# Patient Record
Sex: Female | Born: 1935
Health system: Southern US, Community
[De-identification: ages and names within clinical notes are randomized; demographics above are authoritative.]

## PROBLEM LIST (undated history)

## (undated) DIAGNOSIS — K5900 Constipation, unspecified: Secondary | ICD-10-CM

## (undated) DIAGNOSIS — I10 Essential (primary) hypertension: Secondary | ICD-10-CM

## (undated) DIAGNOSIS — E039 Hypothyroidism, unspecified: Secondary | ICD-10-CM

## (undated) DIAGNOSIS — R0602 Shortness of breath: Secondary | ICD-10-CM

## (undated) DIAGNOSIS — K219 Gastro-esophageal reflux disease without esophagitis: Secondary | ICD-10-CM

## (undated) DIAGNOSIS — I2510 Atherosclerotic heart disease of native coronary artery without angina pectoris: Secondary | ICD-10-CM

## (undated) DIAGNOSIS — J302 Other seasonal allergic rhinitis: Secondary | ICD-10-CM

## (undated) DIAGNOSIS — F32A Depression, unspecified: Secondary | ICD-10-CM

## (undated) DIAGNOSIS — F329 Major depressive disorder, single episode, unspecified: Secondary | ICD-10-CM

## (undated) DIAGNOSIS — F0390 Unspecified dementia without behavioral disturbance: Secondary | ICD-10-CM

## (undated) DIAGNOSIS — E785 Hyperlipidemia, unspecified: Secondary | ICD-10-CM

## (undated) DIAGNOSIS — M1990 Unspecified osteoarthritis, unspecified site: Secondary | ICD-10-CM

## (undated) HISTORY — PX: BACK SURGERY: SHX140

---

## 2004-02-20 ENCOUNTER — Ambulatory Visit: Payer: Self-pay | Admitting: Internal Medicine

## 2005-02-04 ENCOUNTER — Ambulatory Visit: Payer: Self-pay

## 2006-02-23 ENCOUNTER — Ambulatory Visit: Payer: Self-pay

## 2006-04-13 ENCOUNTER — Ambulatory Visit: Payer: Self-pay | Admitting: Gastroenterology

## 2007-03-31 ENCOUNTER — Ambulatory Visit: Payer: Self-pay | Admitting: Internal Medicine

## 2009-08-28 ENCOUNTER — Ambulatory Visit: Payer: Self-pay | Admitting: Internal Medicine

## 2009-10-17 ENCOUNTER — Ambulatory Visit: Payer: Self-pay | Admitting: Gastroenterology

## 2009-10-18 LAB — PATHOLOGY REPORT

## 2009-11-26 ENCOUNTER — Ambulatory Visit: Payer: Self-pay | Admitting: Gastroenterology

## 2010-10-21 ENCOUNTER — Ambulatory Visit: Payer: Self-pay | Admitting: Internal Medicine

## 2011-05-17 ENCOUNTER — Ambulatory Visit: Payer: Self-pay | Admitting: Internal Medicine

## 2011-06-08 ENCOUNTER — Ambulatory Visit: Payer: Self-pay | Admitting: Specialist

## 2011-11-30 ENCOUNTER — Ambulatory Visit: Payer: Self-pay | Admitting: Internal Medicine

## 2012-02-09 ENCOUNTER — Ambulatory Visit: Payer: Self-pay | Admitting: Neurology

## 2012-09-16 ENCOUNTER — Ambulatory Visit: Payer: Self-pay | Admitting: Internal Medicine

## 2013-01-06 ENCOUNTER — Ambulatory Visit: Payer: Self-pay | Admitting: Internal Medicine

## 2013-02-23 ENCOUNTER — Other Ambulatory Visit: Payer: Self-pay | Admitting: Neurological Surgery

## 2013-02-23 DIAGNOSIS — M47816 Spondylosis without myelopathy or radiculopathy, lumbar region: Secondary | ICD-10-CM

## 2013-03-02 ENCOUNTER — Ambulatory Visit
Admission: RE | Admit: 2013-03-02 | Discharge: 2013-03-02 | Disposition: A | Discharge status: Home / Self Care | Payer: Self-pay | Source: Ambulatory Visit | Attending: Neurological Surgery | Admitting: Neurological Surgery

## 2013-03-02 DIAGNOSIS — M47816 Spondylosis without myelopathy or radiculopathy, lumbar region: Secondary | ICD-10-CM

## 2013-03-02 MED ORDER — IOHEXOL 180 MG/ML  SOLN
18.0000 mL | Freq: Once | INTRAMUSCULAR | Status: AC | PRN
  Administered 2013-03-02: 18 mL via INTRATHECAL

## 2013-03-02 MED ORDER — DIAZEPAM 5 MG PO TABS: 5.0000 mg | ORAL_TABLET | Freq: Once | ORAL | Status: DC

## 2013-03-02 MED ORDER — ONDANSETRON HCL 4 MG/2ML IJ SOLN: 4.0000 mg | Freq: Four times a day (QID) | INTRAMUSCULAR | Status: DC | PRN

## 2013-03-02 NOTE — Progress Notes (Signed)
Patient states she has been off Citalopram for at least the past two days.  

## 2013-03-29 ENCOUNTER — Ambulatory Visit: Payer: Self-pay | Admitting: Physician Assistant

## 2013-04-24 ENCOUNTER — Ambulatory Visit: Payer: Self-pay | Admitting: Cardiology

## 2013-04-24 DIAGNOSIS — I2510 Atherosclerotic heart disease of native coronary artery without angina pectoris: Secondary | ICD-10-CM | POA: Insufficient documentation

## 2013-04-24 DIAGNOSIS — Z9861 Coronary angioplasty status: Secondary | ICD-10-CM

## 2013-04-24 HISTORY — PX: CARDIAC CATHETERIZATION: SHX172

## 2013-05-02 ENCOUNTER — Other Ambulatory Visit: Payer: Self-pay | Admitting: Neurological Surgery

## 2013-05-11 ENCOUNTER — Encounter (HOSPITAL_COMMUNITY): Payer: Self-pay | Admitting: Pharmacy Technician

## 2013-05-12 NOTE — Pre-Procedure Instructions (Signed)
Karla Alexander  05/12/2013   Your procedure is scheduled on:  Friday, May 26, 2013 at 11;01 AM  Report to [facility] (use Main Entrance "A'') at 9:00 AM.  Call this number if you have problems the morning of surgery: [phone]   Remember:   Do not eat food or drink liquids after midnight.   Take these medicines the morning of surgery with A SIP OF WATER: citalopram (CELEXA) 20 MG tablet, levothyroxine (SYNTHROID, LEVOTHROID), metoprolol succinate (TOPROL-XL) 25 MG 24 hr tablet, omeprazole (PRILOSEC) 20 MG capsule, fluticasone (FLONASE) 50 MCG/ACT nasal spray, Fluticasone-Salmeterol (ADVAIR DISKUS IN), if needed: pain medication ( Tylenol  or  Gabapentin  (Neurontin) or HYDROcodone-acetaminophen (NORCO/VICODIN), albuterol (PROVENTIL HFA;VENTOLIN HFA) 108 (90 BASE) MCG/ACT inhaler for wheezing or shortness of breath ( bring inhaler in with you on day of procedure) Stop taking Aspirin and herbal medications. Do not take any NSAIDs ie: Ibuprofen, Advil, Naproxen or any medication containing Aspirin (meloxicam (MOBIC) 15 MG tablet.  Stop 5 days prior to procedure.  Do not wear jewelry, make-up or nail polish.  Do not wear lotions, powders, or perfumes. You may wear deodorant.  Do not shave 48 hours prior to surgery.   Do not bring valuables to the hospital.  [facility] is not responsible for any belongings or valuables.               Contacts, dentures or bridgework may not be worn into surgery.  Leave suitcase in the car. After surgery it may be brought to your room.  For patients admitted to the hospital, discharge time is determined by your treatment team.               Patients discharged the day of surgery will not be allowed to drive home.  Name and phone number of your driver:   Special Instructions:  Special Instructions:Special Instructions: [facility] - Preparing for Surgery  Before surgery, you can play an important role.  Because skin is not sterile, your skin needs  to be as free of germs as possible.  You can reduce the number of germs on you skin by washing with CHG (chlorahexidine gluconate) soap before surgery.  CHG is an antiseptic cleaner which kills germs and bonds with the skin to continue killing germs even after washing.  Please DO NOT use if you have an allergy to CHG or antibacterial soaps.  If your skin becomes reddened/irritated stop using the CHG and inform your nurse when you arrive at Short Stay.  Do not shave (including legs and underarms) for at least 48 hours prior to the first CHG shower.  You may shave your face.  Please follow these instructions carefully:   1.  Shower with CHG Soap the night before surgery and the morning of Surgery.  2.  If you choose to wash your hair, wash your hair first as usual with your normal shampoo.  3.  After you shampoo, rinse your hair and body thoroughly to remove the Shampoo.  4.  Use CHG as you would any other liquid soap.  You can apply chg directly  to the skin and wash gently with scrungie or a clean washcloth.  5.  Apply the CHG Soap to your body ONLY FROM THE NECK DOWN.  Do not use on open wounds or open sores.  Avoid contact with your eyes, ears, mouth and genitals (private parts).  Wash genitals (private parts) with your normal soap.  6.  Wash thoroughly, paying  special attention to the area where your surgery will be performed.  7.  Thoroughly rinse your body with warm water from the neck down.  8.  DO NOT shower/wash with your normal soap after using and rinsing off the CHG Soap.  9.  Pat yourself dry with a clean towel.            10.  Wear clean pajamas.            11.  Place clean sheets on your bed the night of your first shower and do not sleep with pets.  Day of Surgery  Do not apply any lotions/deodorants the morning of surgery.  Please wear clean clothes to the hospital/surgery center.   Please read over the following fact sheets that you were given: Pain Booklet, Coughing and Deep  Breathing, Blood Transfusion Information, MRSA Information and Surgical Site Infection Prevention

## 2013-05-15 ENCOUNTER — Ambulatory Visit (HOSPITAL_COMMUNITY)
Admission: RE | Admit: 2013-05-15 | Discharge: 2013-05-15 | Disposition: A | Discharge status: Home / Self Care | Payer: Medicare HMO | Source: Ambulatory Visit | Attending: Neurological Surgery | Admitting: Neurological Surgery

## 2013-05-15 ENCOUNTER — Encounter (HOSPITAL_COMMUNITY): Payer: Self-pay

## 2013-05-15 ENCOUNTER — Encounter (HOSPITAL_COMMUNITY)
Admission: RE | Admit: 2013-05-15 | Discharge: 2013-05-15 | Disposition: A | Discharge status: Home / Self Care | Payer: Medicare HMO | Source: Ambulatory Visit | Attending: Neurological Surgery | Admitting: Neurological Surgery

## 2013-05-15 DIAGNOSIS — Z01818 Encounter for other preprocedural examination: Secondary | ICD-10-CM | POA: Insufficient documentation

## 2013-05-15 DIAGNOSIS — Z01810 Encounter for preprocedural cardiovascular examination: Secondary | ICD-10-CM | POA: Insufficient documentation

## 2013-05-15 DIAGNOSIS — Z01812 Encounter for preprocedural laboratory examination: Secondary | ICD-10-CM | POA: Insufficient documentation

## 2013-05-15 HISTORY — DX: Gastro-esophageal reflux disease without esophagitis: K21.9

## 2013-05-15 HISTORY — DX: Essential (primary) hypertension: I10

## 2013-05-15 HISTORY — DX: Major depressive disorder, single episode, unspecified: F32.9

## 2013-05-15 HISTORY — DX: Shortness of breath: R06.02

## 2013-05-15 HISTORY — DX: Depression, unspecified: F32.A

## 2013-05-15 HISTORY — DX: Unspecified osteoarthritis, unspecified site: M19.90

## 2013-05-15 HISTORY — DX: Hypothyroidism, unspecified: E03.9

## 2013-05-15 HISTORY — DX: Atherosclerotic heart disease of native coronary artery without angina pectoris: I25.10

## 2013-05-15 LAB — CBC WITH DIFFERENTIAL/PLATELET
BASOS ABS: 0 10*3/uL (ref 0.0–0.1)
BASOS PCT: 0 % (ref 0–1)
Eosinophils Absolute: 0.1 10*3/uL (ref 0.0–0.7)
Eosinophils Relative: 1 % (ref 0–5)
HCT: 40.3 % (ref 36.0–46.0)
Hemoglobin: 13.8 g/dL (ref 12.0–15.0)
Lymphocytes Relative: 32 % (ref 12–46)
Lymphs Abs: 2.6 10*3/uL (ref 0.7–4.0)
MCH: 30.4 pg (ref 26.0–34.0)
MCHC: 34.2 g/dL (ref 30.0–36.0)
MCV: 88.8 fL (ref 78.0–100.0)
Monocytes Absolute: 0.6 10*3/uL (ref 0.1–1.0)
Monocytes Relative: 7 % (ref 3–12)
NEUTROS PCT: 60 % (ref 43–77)
Neutro Abs: 4.9 10*3/uL (ref 1.7–7.7)
Platelets: 293 10*3/uL (ref 150–400)
RBC: 4.54 MIL/uL (ref 3.87–5.11)
RDW: 12.7 % (ref 11.5–15.5)
WBC: 8.2 10*3/uL (ref 4.0–10.5)

## 2013-05-15 LAB — BASIC METABOLIC PANEL
BUN: 13 mg/dL (ref 6–23)
CO2: 28 mEq/L (ref 19–32)
Calcium: 10 mg/dL (ref 8.4–10.5)
Chloride: 101 mEq/L (ref 96–112)
Creatinine, Ser: 1.02 mg/dL (ref 0.50–1.10)
GFR, EST AFRICAN AMERICAN: 59 mL/min — AB (ref >90)
GFR, EST NON AFRICAN AMERICAN: 51 mL/min — AB (ref >90)
Glucose, Bld: 103 mg/dL — ABNORMAL HIGH (ref 70–99)
POTASSIUM: 4 meq/L (ref 3.7–5.3)
Sodium: 141 mEq/L (ref 137–147)

## 2013-05-15 LAB — PROTIME-INR
INR: 0.93 (ref 0.00–1.49)
Prothrombin Time: 12.3 seconds (ref 11.6–15.2)

## 2013-05-15 LAB — SURGICAL PCR SCREEN
MRSA, PCR: NEGATIVE
STAPHYLOCOCCUS AUREUS: NEGATIVE

## 2013-05-15 LAB — ABO/RH: ABO/RH(D): O POS

## 2013-05-16 ENCOUNTER — Encounter (HOSPITAL_COMMUNITY): Payer: Self-pay

## 2013-05-16 LAB — TYPE AND SCREEN
ABO/RH(D): O POS
ANTIBODY SCREEN: NEGATIVE

## 2013-05-16 NOTE — Progress Notes (Signed)
Anesthesia Chart Review:  Patient is a 80 year old female scheduled for L4-5, L5-S1 MAS, PLIF on 05/26/13 by Dr. Ronnald Ramp.  History includes non-smoker, GERD, HTN, hypothyroidism, arthritis, depression, non-obstructive CAD 04/2013.  PCP is Dr. Doy Hutching.  Cardiologist is Dr. Isaias Cowman.   She had an abnormal stress test on 04/12/13 that showed apical ischemia, EF 68%.  She was subsequently referred for cardiac cath. Cardiac cath on 04/24/13 [facility]) showed: 20% mLAD, 20% OM1, 30% #1/60% #2 pRCA.  Medical therapy was recommended, and she was felt okay to proceed with neurosurgery.  EKG on 05/15/13 showed NSR, non-specific ST/T wave abnormality.  CXR on 05/15/13 showed: FINDINGS: Low lung volumes. Cardiac silhouette is enlarged. There is diffuse prominence of the interstitial markings. There is no evidence peribronchial cuffing nor effusions. No focal region consolidation or focal infiltrates. The osseous structures are unremarkable. IMPRESSION: Interstitial prominence likely representing underlying chronic bronchitic changes. No focal regions of consolidation or focal infiltrates. Unless clinically appropriate an infectious or inflammatory interstitial infiltrate is of much lower differential consideration.   Preoperative labs noted.    Anticipate that she can proceed as planned.  George Hugh [facility] Short Stay Center/Anesthesiology Phone [phone] 05/16/2013 2:43 PM

## 2013-05-19 ENCOUNTER — Other Ambulatory Visit: Payer: Self-pay | Admitting: Neurological Surgery

## 2013-05-30 MED ORDER — DEXAMETHASONE SODIUM PHOSPHATE 10 MG/ML IJ SOLN
10.0000 mg | INTRAMUSCULAR | Status: AC
  Administered 2013-05-31: 10 mg via INTRAVENOUS
  Filled 2013-05-30: qty 1

## 2013-05-30 MED ORDER — CEFAZOLIN SODIUM-DEXTROSE 2-3 GM-% IV SOLR
2.0000 g | INTRAVENOUS | Status: AC
  Administered 2013-05-31: 2 g via INTRAVENOUS
  Filled 2013-05-30: qty 50

## 2013-05-30 NOTE — Progress Notes (Signed)
I left a message for [place] regarding labs.  Since patient has bee rescheduled we will need new orders for labs or we will follow anesthesia orders.

## 2013-05-30 NOTE — Progress Notes (Signed)
I spoke with Ms Robello's neice who is her POA and she reported that Dr Ronnald Ramp' office told her to arrive at 10:00am.  I answered her questions regarding shampooing and showering and there were no further questions.

## 2013-05-31 ENCOUNTER — Inpatient Hospital Stay (HOSPITAL_COMMUNITY): Payer: Medicare HMO | Admitting: Anesthesiology

## 2013-05-31 ENCOUNTER — Encounter (HOSPITAL_COMMUNITY)
Admission: RE | Disposition: A | Discharge status: Skilled Nursing Facility | Payer: Medicare HMO | Source: Ambulatory Visit | Attending: Neurological Surgery

## 2013-05-31 ENCOUNTER — Inpatient Hospital Stay (HOSPITAL_COMMUNITY)
Admission: RE | Admit: 2013-05-31 | Discharge: 2013-06-06 | DRG: 460 | Disposition: A | Discharge status: Skilled Nursing Facility | Payer: Medicare HMO | Source: Ambulatory Visit | Attending: Neurological Surgery | Admitting: Neurological Surgery

## 2013-05-31 ENCOUNTER — Inpatient Hospital Stay (HOSPITAL_COMMUNITY): Payer: Medicare HMO

## 2013-05-31 ENCOUNTER — Encounter (HOSPITAL_COMMUNITY): Payer: Medicare HMO | Admitting: Vascular Surgery

## 2013-05-31 ENCOUNTER — Encounter (HOSPITAL_COMMUNITY): Payer: Self-pay | Admitting: *Deleted

## 2013-05-31 DIAGNOSIS — Z7982 Long term (current) use of aspirin: Secondary | ICD-10-CM

## 2013-05-31 DIAGNOSIS — Q762 Congenital spondylolisthesis: Principal | ICD-10-CM

## 2013-05-31 DIAGNOSIS — E669 Obesity, unspecified: Secondary | ICD-10-CM | POA: Diagnosis present

## 2013-05-31 DIAGNOSIS — I2510 Atherosclerotic heart disease of native coronary artery without angina pectoris: Secondary | ICD-10-CM | POA: Diagnosis present

## 2013-05-31 DIAGNOSIS — M129 Arthropathy, unspecified: Secondary | ICD-10-CM | POA: Diagnosis present

## 2013-05-31 DIAGNOSIS — Z981 Arthrodesis status: Secondary | ICD-10-CM

## 2013-05-31 DIAGNOSIS — K219 Gastro-esophageal reflux disease without esophagitis: Secondary | ICD-10-CM | POA: Diagnosis present

## 2013-05-31 DIAGNOSIS — E039 Hypothyroidism, unspecified: Secondary | ICD-10-CM | POA: Diagnosis present

## 2013-05-31 DIAGNOSIS — F3289 Other specified depressive episodes: Secondary | ICD-10-CM | POA: Diagnosis present

## 2013-05-31 DIAGNOSIS — I10 Essential (primary) hypertension: Secondary | ICD-10-CM | POA: Diagnosis present

## 2013-05-31 DIAGNOSIS — F329 Major depressive disorder, single episode, unspecified: Secondary | ICD-10-CM | POA: Diagnosis present

## 2013-05-31 DIAGNOSIS — N390 Urinary tract infection, site not specified: Secondary | ICD-10-CM | POA: Diagnosis not present

## 2013-05-31 DIAGNOSIS — M48061 Spinal stenosis, lumbar region without neurogenic claudication: Secondary | ICD-10-CM | POA: Diagnosis present

## 2013-05-31 DIAGNOSIS — Z79899 Other long term (current) drug therapy: Secondary | ICD-10-CM

## 2013-05-31 DIAGNOSIS — R339 Retention of urine, unspecified: Secondary | ICD-10-CM | POA: Diagnosis present

## 2013-05-31 HISTORY — PX: MAXIMUM ACCESS (MAS)POSTERIOR LUMBAR INTERBODY FUSION (PLIF) 2 LEVEL: SHX6369

## 2013-05-31 LAB — TYPE AND SCREEN
ABO/RH(D): O POS
Antibody Screen: NEGATIVE

## 2013-05-31 SURGERY — FOR MAXIMUM ACCESS (MAS) POSTERIOR LUMBAR INTERBODY FUSION (PLIF) 2 LEVEL
Anesthesia: General | Site: Back

## 2013-05-31 MED ORDER — PHENYLEPHRINE HCL 10 MG/ML IJ SOLN
INTRAMUSCULAR | Status: AC
  Filled 2013-05-31: qty 1

## 2013-05-31 MED ORDER — MENTHOL 3 MG MT LOZG: 1.0000 | LOZENGE | OROMUCOSAL | Status: DC | PRN

## 2013-05-31 MED ORDER — ONDANSETRON HCL 4 MG/2ML IJ SOLN: 4.0000 mg | INTRAMUSCULAR | Status: DC | PRN

## 2013-05-31 MED ORDER — LIDOCAINE HCL (CARDIAC) 20 MG/ML IV SOLN
INTRAVENOUS | Status: DC | PRN
  Administered 2013-05-31: 100 mg via INTRAVENOUS

## 2013-05-31 MED ORDER — HYDROMORPHONE HCL PF 1 MG/ML IJ SOLN
0.2500 mg | INTRAMUSCULAR | Status: DC | PRN
  Administered 2013-05-31: 0.5 mg via INTRAVENOUS

## 2013-05-31 MED ORDER — MORPHINE SULFATE 2 MG/ML IJ SOLN
1.0000 mg | INTRAMUSCULAR | Status: DC | PRN
  Administered 2013-05-31: 4 mg via INTRAVENOUS
  Administered 2013-06-01: 2 mg via INTRAVENOUS
  Filled 2013-05-31: qty 1
  Filled 2013-05-31: qty 2

## 2013-05-31 MED ORDER — THROMBIN 20000 UNITS EX SOLR
CUTANEOUS | Status: DC | PRN
  Administered 2013-05-31: 13:00:00 via TOPICAL

## 2013-05-31 MED ORDER — PHENOL 1.4 % MT LIQD: 1.0000 | OROMUCOSAL | Status: DC | PRN

## 2013-05-31 MED ORDER — SODIUM CHLORIDE 0.9 % IV SOLN: 250.0000 mL | INTRAVENOUS | Status: DC

## 2013-05-31 MED ORDER — FENTANYL CITRATE 0.05 MG/ML IJ SOLN
INTRAMUSCULAR | Status: AC
  Filled 2013-05-31: qty 5

## 2013-05-31 MED ORDER — HYDROMORPHONE HCL PF 1 MG/ML IJ SOLN
INTRAMUSCULAR | Status: AC
  Filled 2013-05-31: qty 1

## 2013-05-31 MED ORDER — ACETAMINOPHEN 650 MG RE SUPP: 650.0000 mg | RECTAL | Status: DC | PRN

## 2013-05-31 MED ORDER — 0.9 % SODIUM CHLORIDE (POUR BTL) OPTIME
TOPICAL | Status: DC | PRN
  Administered 2013-05-31: 1000 mL

## 2013-05-31 MED ORDER — EPHEDRINE SULFATE 50 MG/ML IJ SOLN
INTRAMUSCULAR | Status: DC | PRN
  Administered 2013-05-31: 10 mg via INTRAVENOUS
  Administered 2013-05-31: 15 mg via INTRAVENOUS
  Administered 2013-05-31 (×2): 10 mg via INTRAVENOUS

## 2013-05-31 MED ORDER — SUCCINYLCHOLINE CHLORIDE 20 MG/ML IJ SOLN
INTRAMUSCULAR | Status: DC | PRN
  Administered 2013-05-31: 100 mg via INTRAVENOUS

## 2013-05-31 MED ORDER — CELECOXIB 200 MG PO CAPS
200.0000 mg | ORAL_CAPSULE | Freq: Two times a day (BID) | ORAL | Status: AC
  Administered 2013-05-31 – 2013-06-05 (×10): 200 mg via ORAL
  Filled 2013-05-31 (×10): qty 1

## 2013-05-31 MED ORDER — LEVOTHYROXINE SODIUM 100 MCG PO TABS
100.0000 ug | ORAL_TABLET | Freq: Every day | ORAL | Status: DC
  Administered 2013-06-01 – 2013-06-06 (×6): 100 ug via ORAL
  Filled 2013-05-31 (×7): qty 1

## 2013-05-31 MED ORDER — PHENYLEPHRINE HCL 10 MG/ML IJ SOLN
INTRAMUSCULAR | Status: DC | PRN
  Administered 2013-05-31: 80 ug via INTRAVENOUS

## 2013-05-31 MED ORDER — GABAPENTIN 300 MG PO CAPS
300.0000 mg | ORAL_CAPSULE | Freq: Two times a day (BID) | ORAL | Status: DC | PRN
  Filled 2013-05-31: qty 1

## 2013-05-31 MED ORDER — ACETAMINOPHEN 10 MG/ML IV SOLN
1000.0000 mg | Freq: Once | INTRAVENOUS | Status: AC
  Administered 2013-05-31: 1000 mg via INTRAVENOUS

## 2013-05-31 MED ORDER — GLYCOPYRROLATE 0.2 MG/ML IJ SOLN
INTRAMUSCULAR | Status: DC | PRN
  Administered 2013-05-31: 0.2 mg via INTRAVENOUS

## 2013-05-31 MED ORDER — METHOCARBAMOL 500 MG PO TABS
500.0000 mg | ORAL_TABLET | Freq: Four times a day (QID) | ORAL | Status: DC | PRN
  Administered 2013-06-04 – 2013-06-05 (×3): 500 mg via ORAL
  Filled 2013-05-31 (×4): qty 1

## 2013-05-31 MED ORDER — ACETAMINOPHEN 10 MG/ML IV SOLN
INTRAVENOUS | Status: AC
  Filled 2013-05-31: qty 100

## 2013-05-31 MED ORDER — METHOCARBAMOL 100 MG/ML IJ SOLN
500.0000 mg | Freq: Four times a day (QID) | INTRAVENOUS | Status: DC | PRN
  Filled 2013-05-31: qty 5

## 2013-05-31 MED ORDER — LACTATED RINGERS IV SOLN
INTRAVENOUS | Status: DC
  Administered 2013-05-31: 11:00:00 via INTRAVENOUS

## 2013-05-31 MED ORDER — ALENDRONATE SODIUM 70 MG PO TABS: 70.0000 mg | ORAL_TABLET | ORAL | Status: DC

## 2013-05-31 MED ORDER — LACTATED RINGERS IV SOLN
INTRAVENOUS | Status: DC | PRN
  Administered 2013-05-31 (×2): via INTRAVENOUS

## 2013-05-31 MED ORDER — ALBUTEROL SULFATE (2.5 MG/3ML) 0.083% IN NEBU: 2.5000 mg | INHALATION_SOLUTION | Freq: Four times a day (QID) | RESPIRATORY_TRACT | Status: DC | PRN

## 2013-05-31 MED ORDER — BACITRACIN 50000 UNITS IM SOLR
INTRAMUSCULAR | Status: DC | PRN
  Administered 2013-05-31: 13:00:00

## 2013-05-31 MED ORDER — FLUTICASONE-SALMETEROL 100-50 MCG/DOSE IN AEPB
1.0000 | INHALATION_SPRAY | Freq: Two times a day (BID) | RESPIRATORY_TRACT | Status: DC
  Filled 2013-05-31: qty 14

## 2013-05-31 MED ORDER — ACETAMINOPHEN 325 MG PO TABS
650.0000 mg | ORAL_TABLET | ORAL | Status: DC | PRN
  Administered 2013-06-02: 650 mg via ORAL
  Filled 2013-05-31: qty 2

## 2013-05-31 MED ORDER — CLONAZEPAM 0.5 MG PO TABS
0.5000 mg | ORAL_TABLET | Freq: Two times a day (BID) | ORAL | Status: DC | PRN
  Administered 2013-06-05 – 2013-06-06 (×2): 0.5 mg via ORAL
  Filled 2013-05-31 (×2): qty 1

## 2013-05-31 MED ORDER — ONDANSETRON HCL 4 MG/2ML IJ SOLN: 4.0000 mg | Freq: Four times a day (QID) | INTRAMUSCULAR | Status: DC | PRN

## 2013-05-31 MED ORDER — METOPROLOL SUCCINATE ER 25 MG PO TB24
25.0000 mg | ORAL_TABLET | Freq: Every day | ORAL | Status: DC
  Administered 2013-06-02 – 2013-06-05 (×4): 25 mg via ORAL
  Filled 2013-05-31 (×6): qty 1

## 2013-05-31 MED ORDER — PANTOPRAZOLE SODIUM 40 MG IV SOLR
40.0000 mg | Freq: Every day | INTRAVENOUS | Status: DC
  Administered 2013-05-31: 40 mg via INTRAVENOUS
  Filled 2013-05-31 (×2): qty 40

## 2013-05-31 MED ORDER — FENTANYL CITRATE 0.05 MG/ML IJ SOLN
INTRAMUSCULAR | Status: DC | PRN
  Administered 2013-05-31: 150 ug via INTRAVENOUS
  Administered 2013-05-31: 50 ug via INTRAVENOUS

## 2013-05-31 MED ORDER — BUPIVACAINE HCL (PF) 0.25 % IJ SOLN
INTRAMUSCULAR | Status: DC | PRN
  Administered 2013-05-31: 6 mL

## 2013-05-31 MED ORDER — PROPOFOL 10 MG/ML IV BOLUS
INTRAVENOUS | Status: AC
  Filled 2013-05-31: qty 20

## 2013-05-31 MED ORDER — POTASSIUM CHLORIDE IN NACL 20-0.9 MEQ/L-% IV SOLN
INTRAVENOUS | Status: DC
  Administered 2013-05-31 – 2013-06-03 (×3): via INTRAVENOUS
  Filled 2013-05-31 (×12): qty 1000

## 2013-05-31 MED ORDER — THROMBIN 5000 UNITS EX SOLR
OROMUCOSAL | Status: DC | PRN
  Administered 2013-05-31: 13:00:00 via TOPICAL

## 2013-05-31 MED ORDER — MOMETASONE FURO-FORMOTEROL FUM 100-5 MCG/ACT IN AERO
2.0000 | INHALATION_SPRAY | Freq: Two times a day (BID) | RESPIRATORY_TRACT | Status: DC
  Administered 2013-06-01 – 2013-06-06 (×9): 2 via RESPIRATORY_TRACT
  Filled 2013-05-31: qty 8.8

## 2013-05-31 MED ORDER — CITALOPRAM HYDROBROMIDE 20 MG PO TABS
20.0000 mg | ORAL_TABLET | Freq: Every day | ORAL | Status: DC
  Administered 2013-05-31 – 2013-06-06 (×7): 20 mg via ORAL
  Filled 2013-05-31 (×7): qty 1

## 2013-05-31 MED ORDER — SODIUM CHLORIDE 0.9 % IJ SOLN
3.0000 mL | Freq: Two times a day (BID) | INTRAMUSCULAR | Status: DC
  Administered 2013-06-01 – 2013-06-05 (×4): 3 mL via INTRAVENOUS

## 2013-05-31 MED ORDER — SODIUM CHLORIDE 0.9 % IV SOLN
10.0000 mg | INTRAVENOUS | Status: DC | PRN
  Administered 2013-05-31: 10 ug/min via INTRAVENOUS

## 2013-05-31 MED ORDER — SODIUM CHLORIDE 0.9 % IJ SOLN: 3.0000 mL | INTRAMUSCULAR | Status: DC | PRN

## 2013-05-31 MED ORDER — CEFAZOLIN SODIUM 1-5 GM-% IV SOLN
1.0000 g | Freq: Three times a day (TID) | INTRAVENOUS | Status: AC
  Administered 2013-05-31 – 2013-06-01 (×2): 1 g via INTRAVENOUS
  Filled 2013-05-31 (×2): qty 50

## 2013-05-31 MED ORDER — PROPOFOL 10 MG/ML IV BOLUS
INTRAVENOUS | Status: DC | PRN
Start: 2013-05-31 — End: 2013-05-31
  Administered 2013-05-31: 125 mg via INTRAVENOUS
  Administered 2013-05-31: 25 mg via INTRAVENOUS

## 2013-05-31 MED ORDER — HYDROCODONE-ACETAMINOPHEN 5-325 MG PO TABS
1.0000 | ORAL_TABLET | Freq: Four times a day (QID) | ORAL | Status: DC | PRN
  Administered 2013-06-01: 1 via ORAL
  Administered 2013-06-02: 2 via ORAL
  Administered 2013-06-03 – 2013-06-05 (×7): 1 via ORAL
  Administered 2013-06-05 – 2013-06-06 (×3): 2 via ORAL
  Filled 2013-05-31: qty 2
  Filled 2013-05-31 (×2): qty 1
  Filled 2013-05-31 (×2): qty 2
  Filled 2013-05-31: qty 1
  Filled 2013-05-31: qty 2
  Filled 2013-05-31 (×5): qty 1
  Filled 2013-05-31: qty 2

## 2013-05-31 MED ORDER — SENNA 8.6 MG PO TABS
1.0000 | ORAL_TABLET | Freq: Two times a day (BID) | ORAL | Status: DC
  Administered 2013-05-31 – 2013-06-06 (×9): 8.6 mg via ORAL
  Filled 2013-05-31 (×12): qty 1

## 2013-05-31 MED ORDER — ALBUTEROL SULFATE HFA 108 (90 BASE) MCG/ACT IN AERS: 1.0000 | INHALATION_SPRAY | Freq: Four times a day (QID) | RESPIRATORY_TRACT | Status: DC | PRN

## 2013-05-31 MED ORDER — TRIAMTERENE-HCTZ 37.5-25 MG PO CAPS
1.0000 | ORAL_CAPSULE | Freq: Every day | ORAL | Status: DC
  Administered 2013-06-03 – 2013-06-05 (×3): 1 via ORAL
  Filled 2013-05-31 (×6): qty 1

## 2013-05-31 MED ORDER — ASPIRIN 325 MG PO TABS
325.0000 mg | ORAL_TABLET | Freq: Every day | ORAL | Status: DC
  Administered 2013-05-31 – 2013-06-06 (×7): 325 mg via ORAL
  Filled 2013-05-31 (×7): qty 1

## 2013-05-31 MED ORDER — OXYCODONE HCL 5 MG PO TABS: 5.0000 mg | ORAL_TABLET | Freq: Once | ORAL | Status: DC | PRN

## 2013-05-31 MED ORDER — FLUTICASONE PROPIONATE 50 MCG/ACT NA SUSP
1.0000 | Freq: Every day | NASAL | Status: DC
  Administered 2013-06-02 – 2013-06-06 (×4): 1 via NASAL
  Filled 2013-05-31: qty 16

## 2013-05-31 MED ORDER — ALBUMIN HUMAN 5 % IV SOLN
INTRAVENOUS | Status: DC | PRN
  Administered 2013-05-31: 14:00:00 via INTRAVENOUS

## 2013-05-31 MED ORDER — ARTIFICIAL TEARS OP OINT
TOPICAL_OINTMENT | OPHTHALMIC | Status: DC | PRN
  Administered 2013-05-31: 1 via OPHTHALMIC

## 2013-05-31 MED ORDER — OXYCODONE HCL 5 MG/5ML PO SOLN: 5.0000 mg | Freq: Once | ORAL | Status: DC | PRN

## 2013-05-31 SURGICAL SUPPLY — 69 items
BAG DECANTER FOR FLEXI CONT (MISCELLANEOUS) ×3 IMPLANT
BENZOIN TINCTURE PRP APPL 2/3 (GAUZE/BANDAGES/DRESSINGS) ×3 IMPLANT
BLADE SURG ROTATE 9660 (MISCELLANEOUS) IMPLANT
BONE MATRIX OSTEOCEL PRO MED (Bone Implant) ×6 IMPLANT
BUR MATCHSTICK NEURO 3.0 LAGG (BURR) ×3 IMPLANT
CAGE COROENT LG 10X9X23-12 (Cage) ×6 IMPLANT
CAGE PLIF 8X9X23-12 LUMBAR (Cage) ×6 IMPLANT
CANISTER SUCT 3000ML (MISCELLANEOUS) ×3 IMPLANT
CLIP NEUROVISION LG (CLIP) ×3 IMPLANT
CLOSURE WOUND 1/2 X4 (GAUZE/BANDAGES/DRESSINGS) ×2
CONT SPEC 4OZ CLIKSEAL STRL BL (MISCELLANEOUS) ×6 IMPLANT
COVER BACK TABLE 24X17X13 BIG (DRAPES) IMPLANT
COVER TABLE BACK 60X90 (DRAPES) ×3 IMPLANT
DRAPE C-ARM 42X72 X-RAY (DRAPES) ×3 IMPLANT
DRAPE C-ARMOR (DRAPES) ×3 IMPLANT
DRAPE LAPAROTOMY 100X72X124 (DRAPES) ×3 IMPLANT
DRAPE POUCH INSTRU U-SHP 10X18 (DRAPES) ×3 IMPLANT
DRAPE SURG 17X23 STRL (DRAPES) ×3 IMPLANT
DRESSING TELFA 8X3 (GAUZE/BANDAGES/DRESSINGS) ×3 IMPLANT
DRSG OPSITE 4X5.5 SM (GAUZE/BANDAGES/DRESSINGS) ×6 IMPLANT
DRSG OPSITE POSTOP 4X6 (GAUZE/BANDAGES/DRESSINGS) ×3 IMPLANT
DURAPREP 26ML APPLICATOR (WOUND CARE) ×3 IMPLANT
ELECT REM PT RETURN 9FT ADLT (ELECTROSURGICAL) ×3
ELECTRODE REM PT RTRN 9FT ADLT (ELECTROSURGICAL) ×1 IMPLANT
EVACUATOR 1/8 PVC DRAIN (DRAIN) ×3 IMPLANT
GAUZE SPONGE 4X4 16PLY XRAY LF (GAUZE/BANDAGES/DRESSINGS) IMPLANT
GLOVE BIO SURGEON STRL SZ8 (GLOVE) ×6 IMPLANT
GLOVE BIOGEL PI IND STRL 6.5 (GLOVE) ×1 IMPLANT
GLOVE BIOGEL PI IND STRL 8 (GLOVE) ×1 IMPLANT
GLOVE BIOGEL PI INDICATOR 6.5 (GLOVE) ×2
GLOVE BIOGEL PI INDICATOR 8 (GLOVE) ×2
GLOVE ECLIPSE 7.5 STRL STRAW (GLOVE) ×3 IMPLANT
GLOVE SS BIOGEL STRL SZ 6.5 (GLOVE) ×2 IMPLANT
GLOVE SUPERSENSE BIOGEL SZ 6.5 (GLOVE) ×4
GOWN BRE IMP SLV AUR LG STRL (GOWN DISPOSABLE) IMPLANT
GOWN BRE IMP SLV AUR XL STRL (GOWN DISPOSABLE) IMPLANT
GOWN STRL REIN 2XL LVL4 (GOWN DISPOSABLE) IMPLANT
GOWN STRL REUS W/ TWL LRG LVL3 (GOWN DISPOSABLE) ×2 IMPLANT
GOWN STRL REUS W/ TWL XL LVL3 (GOWN DISPOSABLE) ×3 IMPLANT
GOWN STRL REUS W/TWL LRG LVL3 (GOWN DISPOSABLE) ×4
GOWN STRL REUS W/TWL XL LVL3 (GOWN DISPOSABLE) ×6
HEMOSTAT POWDER KIT SURGIFOAM (HEMOSTASIS) ×3 IMPLANT
KIT BASIN OR (CUSTOM PROCEDURE TRAY) ×3 IMPLANT
KIT ROOM TURNOVER OR (KITS) ×3 IMPLANT
MILL MEDIUM DISP (BLADE) ×3 IMPLANT
NEEDLE HYPO 25X1 1.5 SAFETY (NEEDLE) ×3 IMPLANT
NS IRRIG 1000ML POUR BTL (IV SOLUTION) ×3 IMPLANT
PACK LAMINECTOMY NEURO (CUSTOM PROCEDURE TRAY) ×3 IMPLANT
PAD ARMBOARD 7.5X6 YLW CONV (MISCELLANEOUS) ×9 IMPLANT
ROD 60MM LUMBAR (Rod) ×6 IMPLANT
SCREW LOCK (Screw) ×12 IMPLANT
SCREW LOCK FXNS SPNE MAS PL (Screw) ×6 IMPLANT
SCREW SHANK 5.0X30MM (Screw) ×6 IMPLANT
SCREW SHANK 6.5X30 (Screw) ×3 IMPLANT
SCREW SHANK 6.5X65 (Screw) ×3 IMPLANT
SCREW TULIP 5.5 (Screw) ×18 IMPLANT
SPONGE LAP 4X18 X RAY DECT (DISPOSABLE) IMPLANT
SPONGE SURGIFOAM ABS GEL 100 (HEMOSTASIS) ×3 IMPLANT
STRIP CLOSURE SKIN 1/2X4 (GAUZE/BANDAGES/DRESSINGS) ×4 IMPLANT
SUT VIC AB 0 CT1 18XCR BRD8 (SUTURE) ×1 IMPLANT
SUT VIC AB 0 CT1 8-18 (SUTURE) ×2
SUT VIC AB 2-0 CP2 18 (SUTURE) ×3 IMPLANT
SUT VIC AB 3-0 SH 8-18 (SUTURE) ×6 IMPLANT
SYR 20ML ECCENTRIC (SYRINGE) ×3 IMPLANT
SYR 3ML LL SCALE MARK (SYRINGE) IMPLANT
TOWEL OR 17X24 6PK STRL BLUE (TOWEL DISPOSABLE) ×3 IMPLANT
TOWEL OR 17X26 10 PK STRL BLUE (TOWEL DISPOSABLE) ×3 IMPLANT
TRAY FOLEY CATH 14FRSI W/METER (CATHETERS) ×3 IMPLANT
WATER STERILE IRR 1000ML POUR (IV SOLUTION) ×3 IMPLANT

## 2013-05-31 NOTE — Op Note (Signed)
05/31/2013  4:20 PM  PATIENT:  Karla Alexander  80 y.o. female  PRE-OPERATIVE DIAGNOSIS:  Lytic spondylolisthesis L5-S1, spinal stenosis with spondylolisthesis L4-5, spinal stenosis L5-S1, back and leg pain  POST-OPERATIVE DIAGNOSIS:  Same  PROCEDURE:   1. Decompressive lumbar laminectomy L4-5 and L5-S1 requiring more work than would be required for a simple exposure of the disk for PLIF in order to adequately decompress the neural elements and address the spinal stenosis 2. Posterior lumbar interbody fusion L4-5 and L5-S1 using PEEK interbody cages packed with morcellized allograft and autograft 3. Posterior fixation L4-S1 inclusive using cortical pedicle screws.    SURGEON:  Sherley Bounds, MD  ASSISTANTS: Dr. Sherwood Gambler  ANESTHESIA:  General  EBL: 200 ml  Total I/O In: 4097 [I.V.:1400; IV Piggyback:250] Out: 500 [Urine:300; Blood:200]  BLOOD ADMINISTERED:none  DRAINS: Hemovac   INDICATION FOR PROCEDURE: This patient presented with a long history of back and leg pain. She had a CT scan with myelogram which showed a lytic pars defects at L5 with a grade 1 spondylolisthesis of local. She also had a spondylolisthesis that was degenerative in nature at L4-5. She had stenosis at both levels. She tried medical management without relief. I recommended a decompression and instrumented fusion. Patient understood the risks, benefits, and alternatives and potential outcomes and wished to proceed.  PROCEDURE DETAILS:  The patient was brought to the operating room. After induction of generalized endotracheal anesthesia the patient was rolled into the prone position on chest rolls and all pressure points were padded. The patient's lumbar region was cleaned and then prepped with DuraPrep and draped in the usual sterile fashion. Anesthesia was injected and then a dorsal midline incision was made and carried down to the lumbosacral fascia. The fascia was opened and the paraspinous musculature was taken  down in a subperiosteal fashion to expose L4-5 and L5-S1. A self-retaining retractor was placed. Intraoperative fluoroscopy confirmed my level, and I started with placement of the L4 cortical pedicle screws. The pedicle screw entry zones were identified utilizing surface landmarks and  AP and lateral fluoroscopy. I scored the cortex with the high-speed drill and then used the hand drill and EMG monitoring to drill an upward and outward direction into the pedicle. I then tapped line to line, and the tap was also monitored. I then placed a 5-0 x 30 mm cortical pedicle screw into the pedicles of L4 bilaterally. I then turned my attention to the decompression and the spinous process was removed and complete lumbar laminectomies, hemi- facetectomies, and foraminotomies were performed at L4-5 and L5-S1. The patient had significant spinal stenosis and this required more work than would be required for a simple exposure of the disc for posterior lumbar interbody fusion. Much more generous decompression was undertaken in order to adequately decompress the neural elements and address the patient's leg pain. The yellow ligament was removed to expose the underlying dura and nerve roots, and generous foraminotomies were performed to adequately decompress the neural elements. There was severe stenosis around the right L5 nerve root and this was decompressed distally into the foramen. Both the exiting and traversing nerve roots were decompressed on both sides until a coronary dilator passed easily along the nerve roots. Once the decompression was complete, I turned my attention to the posterior lower lumbar interbody fusion. The epidural venous vasculature was coagulated and cut sharply. Disc space was incised at both levels and the initial discectomy was performed with pituitary rongeurs. The disc space was distracted with sequential distractors to  a height of 8 mm L5-S1 and 10 mm at L4-5. We then used a series of scrapers and  shavers to prepare the endplates for fusion at both levels. The midline was prepared with Epstein curettes. Once the complete discectomy was finished, we packed an appropriate sized peek interbody cage with local autograft and morcellized allograft, gently retracted the nerve root, and tapped the cage into position at L4-5 and L5-S1.  The midline between the cages was packed with morselized autograft and allograft. We then turned our attention to the placement of the lower pedicle screws. The pedicle screw entry zones were identified utilizing surface landmarks and fluoroscopy. I drilled into each pedicle utilizing the hand drill and EMG monitoring, and tapped each pedicle with the appropriate tap. We palpated with a ball probe to assure no break in the cortex. We then placed 5-0 by a 30 mm cortical pedicle screws into the pedicles bilaterally at L5, and 6 5 x 30 mm pedicle screws at S1.. We then placed lordotic rods into the multiaxial screw heads of the pedicle screws and locked these in position with the locking caps and anti-torque device. We then checked our construct with AP and lateral fluoroscopy. Irrigated with copious amounts of bacitracin-containing saline solution. Placed a medium Hemovac drain through separate stab incision. Inspected the nerve roots once again to assure adequate decompression, lined to the dura with Gelfoam, and closed the muscle and the fascia with 0 Vicryl. Closed the subcutaneous tissues with 2-0 Vicryl and subcuticular tissues with 3-0 Vicryl. The skin was closed with benzoin and Steri-Strips. Dressing was then applied, the patient was awakened from general anesthesia and transported to the recovery room in stable condition. At the end of the procedure all sponge, needle and instrument counts were correct.   PLAN OF CARE: Admit to inpatient   PATIENT DISPOSITION:  PACU - hemodynamically stable.   Delay start of Pharmacological VTE agent (>24hrs) due to surgical blood loss or  risk of bleeding:  yes

## 2013-05-31 NOTE — Preoperative (Signed)
Beta Blockers   Reason not to administer Beta Blockers:Not Applicable 

## 2013-05-31 NOTE — Anesthesia Procedure Notes (Signed)
Procedure Name: Intubation Date/Time: 05/31/2013 12:25 PM Performed by: Neldon Newport Pre-anesthesia Checklist: Patient identified, Timeout performed, Emergency Drugs available, Suction available and Patient being monitored Patient Re-evaluated:Patient Re-evaluated prior to inductionOxygen Delivery Method: Circle system utilized Preoxygenation: Pre-oxygenation with 100% oxygen Intubation Type: IV induction and Rapid sequence Ventilation: Mask ventilation without difficulty Laryngoscope Size: Mac and 3 Grade View: Grade I Tube type: Oral Tube size: 7.0 mm Number of attempts: 1 (Rapid desaturation on induction.) Placement Confirmation: positive ETCO2,  ETT inserted through vocal cords under direct vision and breath sounds checked- equal and bilateral Secured at: 22 cm Tube secured with: Tape Dental Injury: Teeth and Oropharynx as per pre-operative assessment

## 2013-05-31 NOTE — Anesthesia Preprocedure Evaluation (Addendum)
Anesthesia Evaluation  Patient identified by MRN, date of birth, ID band Patient awake    Reviewed: Allergy & Precautions, H&P , NPO status , Patient's Chart, lab work & pertinent test results  Airway Mallampati: II  Neck ROM: full    Dental  (+) Upper Dentures, Edentulous Upper, Edentulous Lower, Lower Dentures   Pulmonary shortness of breath,          Cardiovascular hypertension, + CAD     Neuro/Psych PSYCHIATRIC DISORDERS Depression    GI/Hepatic GERD-  ,  Endo/Other  Hypothyroidism obese  Renal/GU      Musculoskeletal  (+) Arthritis -,   Abdominal   Peds  Hematology   Anesthesia Other Findings   Reproductive/Obstetrics                          Anesthesia Physical Anesthesia Plan  ASA: III  Anesthesia Plan: General   Post-op Pain Management:    Induction: Intravenous  Airway Management Planned: Oral ETT  Additional Equipment:   Intra-op Plan:   Post-operative Plan: Extubation in OR  Informed Consent: I have reviewed the patients History and Physical, chart, labs and discussed the procedure including the risks, benefits and alternatives for the proposed anesthesia with the patient or authorized representative who has indicated his/her understanding and acceptance.   Dental Advisory Given  Plan Discussed with: CRNA, Anesthesiologist and Surgeon  Anesthesia Plan Comments:        Anesthesia Quick Evaluation

## 2013-05-31 NOTE — Transfer of Care (Signed)
Immediate Anesthesia Transfer of Care Note  Patient: Karla Alexander  Procedure(s) Performed: Procedure(s) with comments: FOR MAXIMUM ACCESS (MAS) POSTERIOR LUMBAR INTERBODY FUSION (PLIF) Lumbar Four-Five, Lumbar Five-Sacral One (N/A) - FOR MAXIMUM ACCESS (MAS) POSTERIOR LUMBAR INTERBODY FUSION (PLIF) Lumbar Four-Five, Lumbar Five-Sacral One  Patient Location: PACU  Anesthesia Type:General  Level of Consciousness: awake, alert  and oriented  Airway & Oxygen Therapy: Patient Spontanous Breathing and Patient connected to nasal cannula oxygen  Post-op Assessment: Report given to PACU RN, Post -op Vital signs reviewed and stable and Patient moving all extremities  Post vital signs: Reviewed and stable  Complications: No apparent anesthesia complications

## 2013-05-31 NOTE — H&P (Signed)
Subjective: Patient is a 80 y.o. female admitted for PLIF L4-5, L5-S1. Onset of symptoms was several months ago, gradually worsening since that time.  The pain is rated severe, and is located at the across the lower back and radiates to legs. The pain is described as aching and occurs all day. The symptoms have been progressive. Symptoms are exacerbated by exercise. MRI or CT showed spondylolisthesis with stenosis L4-5 L5-S1   Past Medical History  Diagnosis Date  . Depression   . GERD (gastroesophageal reflux disease)   . Hypothyroidism   . Shortness of breath   . Arthritis   . Hypertension     dr Delsa Sale   [place]  . Coronary artery disease     non-obstructive CAD 04/2013    Past Surgical History  Procedure Laterality Date  . No past surgeries    . Cardiac catheterization  04/24/2013    20% mLAD, 20% OM1, 30% #1/60% #2 pRCA [facility]).  EF 68% by stress test 04/12/13.     Prior to Admission medications   Medication Sig Start Date End Date Taking? Authorizing Provider  acetaminophen (TYLENOL) 325 MG tablet Take 650 mg by mouth every 6 (six) hours as needed for mild pain or fever.   Yes Historical Provider, MD  albuterol (PROVENTIL HFA;VENTOLIN HFA) 108 (90 BASE) MCG/ACT inhaler Inhale 1 puff into the lungs every 6 (six) hours as needed for wheezing or shortness of breath.   Yes Historical Provider, MD  alendronate (FOSAMAX) 70 MG tablet Take 70 mg by mouth once a week. Take with a full glass of water on an empty stomach.   Yes Historical Provider, MD  aspirin 325 MG tablet Take 325 mg by mouth daily.   Yes Historical Provider, MD  aspirin-sod bicarb-citric acid (ALKA-SELTZER) 325 MG TBEF tablet Take 325 mg by mouth every 6 (six) hours as needed.   Yes Historical Provider, MD  calcium carbonate (OS-CAL) 600 MG TABS tablet Take 600 mg by mouth 2 (two) times daily with a meal.   Yes Historical Provider, MD  Cholecalciferol (VITAMIN D3) 5000 UNITS CAPS Take 1 capsule by mouth daily.   Yes  Historical Provider, MD  citalopram (CELEXA) 20 MG tablet Take 20 mg by mouth daily.   Yes Historical Provider, MD  clonazePAM (KLONOPIN) 0.5 MG tablet Take 0.5 mg by mouth 2 (two) times daily as needed (for restless legs).   Yes Historical Provider, MD  docusate sodium (COLACE) 100 MG capsule Take 100 mg by mouth daily as needed for mild constipation.   Yes Historical Provider, MD  fluticasone (FLONASE) 50 MCG/ACT nasal spray Place 1 spray into both nostrils daily.   Yes Historical Provider, MD  Fluticasone-Salmeterol (ADVAIR DISKUS IN) Inhale 1 puff into the lungs 2 (two) times daily.   Yes Historical Provider, MD  gabapentin (NEURONTIN) 300 MG capsule Take 300 mg by mouth 2 (two) times daily as needed (for pain).   Yes Historical Provider, MD  HYDROcodone-acetaminophen (NORCO/VICODIN) 5-325 MG per tablet Take 1-2 tablets by mouth every 6 (six) hours as needed for moderate pain.   Yes Historical Provider, MD  levothyroxine (SYNTHROID, LEVOTHROID) 100 MCG tablet Take 100 mcg by mouth daily before breakfast.   Yes Historical Provider, MD  lovastatin (MEVACOR) 20 MG tablet Take 20 mg by mouth.   Yes Historical Provider, MD  meloxicam (MOBIC) 15 MG tablet Take 15 mg by mouth daily as needed for pain.   Yes Historical Provider, MD  metoprolol succinate (TOPROL-XL) 25 MG 24  hr tablet Take 25 mg by mouth daily.   Yes Historical Provider, MD  omeprazole (PRILOSEC) 20 MG capsule Take 20 mg by mouth 2 (two) times daily before a meal.   Yes Historical Provider, MD  triamterene-hydrochlorothiazide (DYAZIDE) 37.5-25 MG per capsule Take 1 capsule by mouth daily.   Yes Historical Provider, MD  vitamin B-12 (CYANOCOBALAMIN) 1000 MCG tablet Take 1,000 mcg by mouth daily.   Yes Historical Provider, MD   No Known Allergies  History  Substance Use Topics  . Smoking status: Never Smoker   . Smokeless tobacco: Not on file  . Alcohol Use: No    History reviewed. No pertinent family history.   Review of  Systems  Positive ROS: neg  All other systems have been reviewed and were otherwise negative with the exception of those mentioned in the HPI and as above.  Objective: Vital signs in last 24 hours: Temp:  [98.1 F (36.7 C)] 98.1 F (36.7 C) (03/11 1013) Pulse Rate:  [69] 69 (03/11 1013) Resp:  [18] 18 (03/11 1013) BP: (127)/(78) 127/78 mmHg (03/11 1013) SpO2:  [96 %] 96 % (03/11 1013)  General Appearance: Alert, cooperative, no distress, appears stated age Head: Normocephalic, without obvious abnormality, atraumatic Eyes: PERRL, conjunctiva/corneas clear, EOM's intact    Neck: Supple, symmetrical, trachea midline Back: Symmetric, no curvature, ROM normal, no CVA tenderness Lungs:  respirations unlabored Heart: Regular rate and rhythm Abdomen: Soft, non-tender Extremities: Extremities normal, atraumatic, no cyanosis or edema Pulses: 2+ and symmetric all extremities Skin: Skin color, texture, turgor normal, no rashes or lesions  NEUROLOGIC:   Mental status: Alert and oriented x4,  no aphasia, good attention span, fund of knowledge, and memory Motor Exam - grossly normal Sensory Exam - grossly normal Reflexes: 1+ Coordination - grossly normal Gait - grossly normal Balance - grossly normal Cranial Nerves: I: smell Not tested  II: visual acuity  OS: nl    OD: nl  II: visual fields Full to confrontation  II: pupils Equal, round, reactive to light  III,VII: ptosis None  III,IV,VI: extraocular muscles  Full ROM  V: mastication Normal  V: facial light touch sensation  Normal  V,VII: corneal reflex  Present  VII: facial muscle function - upper  Normal  VII: facial muscle function - lower Normal  VIII: hearing Not tested  IX: soft palate elevation  Normal  IX,X: gag reflex Present  XI: trapezius strength  5/5  XI: sternocleidomastoid strength 5/5  XI: neck flexion strength  5/5  XII: tongue strength  Normal    Data Review Lab Results  Component Value Date   WBC 8.2  05/15/2013   HGB 13.8 05/15/2013   HCT 40.3 05/15/2013   MCV 88.8 05/15/2013   PLT 293 05/15/2013   Lab Results  Component Value Date   NA 141 05/15/2013   K 4.0 05/15/2013   CL 101 05/15/2013   CO2 28 05/15/2013   BUN 13 05/15/2013   CREATININE 1.02 05/15/2013   GLUCOSE 103* 05/15/2013   Lab Results  Component Value Date   INR 0.93 05/15/2013    Assessment/Plan: Patient admitted for PLIF L4-5 L5-S1. Patient has failed a reasonable attempt at conservative therapy.  I explained the condition and procedure to the patient and answered any questions.  Patient wishes to proceed with procedure as planned. Understands risks/ benefits and typical outcomes of procedure.   Ermine Spofford S 05/31/2013 11:35 AM

## 2013-05-31 NOTE — Anesthesia Postprocedure Evaluation (Signed)
Anesthesia Post Note  Patient: Karla Alexander  Procedure(s) Performed: Procedure(s) (LRB): FOR MAXIMUM ACCESS (MAS) POSTERIOR LUMBAR INTERBODY FUSION (PLIF) Lumbar Four-Five, Lumbar Five-Sacral One (N/A)  Anesthesia type: General  Patient location: PACU  Post pain: Pain level controlled and Adequate analgesia  Post assessment: Post-op Vital signs reviewed, Patient's Cardiovascular Status Stable, Respiratory Function Stable, Patent Airway and Pain level controlled  Last Vitals:  Filed Vitals:   05/31/13 1645  BP:   Pulse: 93  Temp:   Resp: 15    Post vital signs: Reviewed and stable  Level of consciousness: awake, alert  and oriented  Complications: No apparent anesthesia complications

## 2013-06-01 ENCOUNTER — Encounter (HOSPITAL_COMMUNITY): Payer: Self-pay | Admitting: Neurological Surgery

## 2013-06-01 LAB — GLUCOSE, CAPILLARY: Glucose-Capillary: 131 mg/dL — ABNORMAL HIGH (ref 70–99)

## 2013-06-01 MED ORDER — PANTOPRAZOLE SODIUM 40 MG PO TBEC
40.0000 mg | DELAYED_RELEASE_TABLET | Freq: Every day | ORAL | Status: DC
  Administered 2013-06-01 – 2013-06-06 (×6): 40 mg via ORAL
  Filled 2013-06-01 (×2): qty 1

## 2013-06-01 NOTE — Progress Notes (Signed)
Utilization review completed.  

## 2013-06-01 NOTE — Progress Notes (Signed)
Filed Vitals:   05/31/13 2127 06/01/13 0111 06/01/13 0206 06/01/13 0522  BP: 120/65  112/67 121/59  Pulse: 71  85 77  Temp: 97.6 F (36.4 C)  97.6 F (36.4 C) 98 F (36.7 C)  TempSrc: Oral  Oral Oral  Resp: 16  16 16   Height:  5\' 1"  (1.549 m)    Weight:  76.4 kg (168 lb 6.9 oz)    SpO2: 96%  96% 96%    Patient sitting in bed eating breakfast.  Has been up to the chair, and ambulated in a limited fashion. Foley removed by staff. Staff reports dressing is clean and dry.  Plan: Have encouraged patient and staff to progress to gradually increasing distances of ambulation in the halls today.  Hosie Spangle, MD 06/01/2013, 8:23 AM

## 2013-06-01 NOTE — Evaluation (Signed)
Physical Therapy Evaluation Patient Details Name: Karla Alexander MRN: [id number] DOB: 1933-03-01 Today's Date: 06/01/2013 Time: 7616-0737 PT Time Calculation (min): 48 min  PT Assessment / Plan / Recommendation History of Present Illness  pt presents with L4-S1 PLIF.    Clinical Impression  Pt generally unsteady and with cognitive deficits, that family states are baseline for pt.  At this time family expresses that they are not comfortable taking pt home at this time.  Feel SNF is safest D/C plan for pt as she will need 24hr care.  Will continue to follow.      PT Assessment  Patient needs continued PT services    Follow Up Recommendations  SNF    Does the patient have the potential to tolerate intense rehabilitation      Barriers to Discharge        Equipment Recommendations  Rolling walker with 5" wheels;3in1 (PT)    Recommendations for Other Services     Frequency Min 5X/week    Precautions / Restrictions Precautions Precautions: Fall Required Braces or Orthoses: Spinal Brace Spinal Brace: Lumbar corset;Applied in sitting position Restrictions Weight Bearing Restrictions: No   Pertinent Vitals/Pain Indicates "Not bad!"        Mobility  Bed Mobility Overal bed mobility: Needs Assistance Bed Mobility: Rolling;Sidelying to Sit Rolling: Min guard Sidelying to sit: Min guard General bed mobility comments: cues for safety and log roll technique.   Transfers Overall transfer level: Needs assistance Equipment used: Rolling walker (2 wheeled) Transfers: Sit to/from Stand Sit to Stand: Min assist General transfer comment: cues for UE use and back precautions.   Ambulation/Gait Ambulation/Gait assistance: Min assist Ambulation Distance (Feet): 100 Feet Assistive device: Rolling walker (2 wheeled) Gait Pattern/deviations: Step-through pattern;Decreased stride length Gait velocity interpretation: Below normal speed for age/gender General Gait Details: pt with almost  festinating type gait with narrow BOS and keeps RW far out in front of her.  pt needs lots of cueing and A to stay closer to RW and for safety.  Cues for back precautions.      Exercises     PT Diagnosis: Difficulty walking;Generalized weakness  PT Problem List: Decreased strength;Decreased activity tolerance;Decreased balance;Decreased mobility;Decreased cognition;Decreased knowledge of use of DME;Decreased safety awareness;Decreased knowledge of precautions PT Treatment Interventions: DME instruction;Gait training;Functional mobility training;Therapeutic activities;Therapeutic exercise;Balance training;Neuromuscular re-education;Patient/family education;Cognitive remediation     PT Goals(Current goals can be found in the care plan section) Acute Rehab PT Goals Patient Stated Goal: Get better PT Goal Formulation: With patient Time For Goal Achievement: 06/15/13 Potential to Achieve Goals: Good  Visit Information  Last PT Received On: 06/01/13 Assistance Needed: +1 History of Present Illness: pt presents with L4-S1 PLIF.         Prior [place] expects to be discharged to:: Skilled nursing facility Prior Function Level of Independence: Needs assistance ADL's / Homemaking Assistance Needed: pt needs A for Homemaking and driving.   Communication Communication: No difficulties    Cognition  Cognition Arousal/Alertness: Awake/alert Behavior During Therapy: WFL for tasks assessed/performed Overall Cognitive Status: History of cognitive impairments - at baseline General Comments: pt does not have a hx of Dementia, but family/CGs not that pt has memory difecitis and that she is currently at her baseline cognition.      Extremity/Trunk Assessment Upper Extremity Assessment Upper Extremity Assessment: Defer to OT evaluation Lower Extremity Assessment Lower Extremity Assessment: Generalized weakness Cervical / Trunk Assessment Cervical / Trunk  Assessment: Kyphotic   Balance Balance Overall  balance assessment: Needs assistance Standing balance support: Bilateral upper extremity supported Standing balance-Leahy Scale: Poor  End of Session PT - End of Session Equipment Utilized During Treatment: Gait belt;Back brace Activity Tolerance: Patient tolerated treatment well Patient left: in chair;with call bell/phone within reach;with chair alarm set;with family/visitor present Nurse Communication: Mobility status  GP     Karla Alexander, [place] 06/01/2013, 1:14 PM

## 2013-06-01 NOTE — Progress Notes (Signed)
Pt has walked with therapy and with RN 2-3x today. Pt used walker and brace and ambulated well. Pt has voided 3x today. Two incontinent episodes and once she was able to make it to the commode. Pt has been confused at times and reoriented. RN will continue to monitor.

## 2013-06-02 MED ORDER — BISACODYL 10 MG RE SUPP
10.0000 mg | Freq: Every day | RECTAL | Status: DC | PRN
  Administered 2013-06-02: 10 mg via RECTAL
  Filled 2013-06-02: qty 1

## 2013-06-02 MED FILL — Sodium Chloride IV Soln 0.9%: INTRAVENOUS | Qty: 1000 | Status: AC

## 2013-06-02 MED FILL — Heparin Sodium (Porcine) Inj 1000 Unit/ML: INTRAMUSCULAR | Qty: 30 | Status: AC

## 2013-06-02 NOTE — Progress Notes (Signed)
Filed Vitals:   06/01/13 2052 06/01/13 2144 06/02/13 0250 06/02/13 0509  BP:  131/74 124/71 144/80  Pulse:  87 85 85  Temp:  97.7 F (36.5 C) 98.5 F (36.9 C) 97.4 F (36.3 C)  TempSrc:  Oral Oral Oral  Resp:  18 18 18   Height:      Weight:      SpO2: 94% 96% 98% 96%    Patient sitting up, having breakfast. Nursing staff reports dressing is clean and dry. Patient reports that she ambulating once in the hallways yesterday.  Plan: Encouraged patient to increase activity and ambulation, and recommended ambulation at least 3-4 times in the hallways with a staff and/or family.  Spoke with patient is 2 nieces who along with the patient would like to undergo rehabilitation at a skilled nursing facility. The patient's husband is at [facility] in [place], and they would like her post discharge rehabilitation to be at that facility. I've discussed this with the nursing staff and nursing administration, and they will consults social work to make the necessary arrangements. In the meantime we will continue physical therapy here at the hospital.  I've asked the nursing staff to remove the patient's dressing and Hemovac drain, and apply a small dressing to the drain exit site.  Hosie Spangle, MD 06/02/2013, 8:35 AM

## 2013-06-02 NOTE — Clinical Social Work Placement (Signed)
Clinical Social Work Department CLINICAL SOCIAL WORK PLACEMENT NOTE 06/02/2013  Patient:  Karla Alexander, Karla Alexander  Account Number:  [id number] [place] date:  05/31/2013  Clinical Social Worker:  Wylene Men  Date/time:  06/02/2013 12:29 PM  Clinical Social Work is seeking post-discharge placement for this patient at the following level of care:   [place]   (*CSW will update this form in Epic as items are completed)   06/02/2013  Patient/family provided with [place] Department of Clinical Social Work's list of facilities offering this level of care within the geographic area requested by the patient (or if unable, by the patient's family).  06/02/2013  Patient/family informed of their freedom to choose among providers that offer the needed level of care, that participate in Medicare, Medicaid or managed care program needed by the patient, have an available bed and are willing to accept the patient.  06/02/2013  Patient/family informed of [facility]' ownership interest in [facility], as well as of the fact that they are under no obligation to receive care at this facility.  PASARR submitted to EDS on 06/02/2013 PASARR number received from EDS on 06/02/2013  FL2 transmitted to all facilities in geographic area requested by pt/family on  06/02/2013 FL2 transmitted to all facilities within larger geographic area on 06/02/2013  Patient informed that his/her managed care company has contracts with or will negotiate with  certain facilities, including the following:     Patient/family informed of bed offers received:   Patient chooses bed at  Physician recommends and patient chooses bed at    Patient to be transferred to  on   Patient to be transferred to facility by   The following physician request were entered in Epic:   Additional Comments:  [name], [place] [phone]  Clinical Social Work

## 2013-06-02 NOTE — Progress Notes (Signed)
Physical Therapy Treatment Patient Details Name: Karla Alexander MRN: [id number] DOB: 1932/12/10 Today's Date: 06/02/2013 Time: 7654-6503 PT Time Calculation (min): 15 min  PT Assessment / Plan / Recommendation  History of Present Illness pt presents with L4-S1 PLIF.     PT Comments   Pt continues to need consistent S and cueing for safety and following back precautions.  Pt still needs A for balance when OOB.  Will continue to follow.    Follow Up Recommendations  SNF     Does the patient have the potential to tolerate intense rehabilitation     Barriers to Discharge        Equipment Recommendations  Rolling walker with 5" wheels;3in1 (PT)    Recommendations for Other Services    Frequency Min 5X/week   Progress towards PT Goals Progress towards PT goals: Progressing toward goals  Plan Current plan remains appropriate    Precautions / Restrictions Precautions Precautions: Fall Required Braces or Orthoses: Spinal Brace Spinal Brace: Lumbar corset;Applied in sitting position Restrictions Weight Bearing Restrictions: No   Pertinent Vitals/Pain Indicates back pain.  Just medicated per nieces.      Mobility  Transfers Overall transfer level: Needs assistance Equipment used: Rolling walker (2 wheeled) Transfers: Sit to/from Stand Sit to Stand: Min assist General transfer comment: cues for UE use and back precautions.   Ambulation/Gait Ambulation/Gait assistance: Min assist Ambulation Distance (Feet): 150 Feet Assistive device: Rolling walker (2 wheeled) Gait Pattern/deviations: Step-through pattern;Decreased stride length;Drifts right/left Gait velocity interpretation: Below normal speed for age/gender General Gait Details: pt continues with short choppy festinating type gait.  pt needs A for balance and RW management as she tends to run into objects.  pt needs consistent S and cues for following back precautions.      Exercises     PT Diagnosis:    PT Problem List:    PT Treatment Interventions:     PT Goals (current goals can now be found in the care plan section) Acute Rehab PT Goals Patient Stated Goal: Get better Time For Goal Achievement: 06/15/13 Potential to Achieve Goals: Good  Visit Information  Last PT Received On: 06/02/13 Assistance Needed: +1 History of Present Illness: pt presents with L4-S1 PLIF.      Subjective Data  Patient Stated Goal: Get better   Cognition  Cognition Arousal/Alertness: Awake/alert Behavior During Therapy: WFL for tasks assessed/performed Overall Cognitive Status: History of cognitive impairments - at baseline General Comments: pt does not have a hx of Dementia, but family/CGs not that pt has memory difecitis and that she is currently at her baseline cognition.      Balance  Balance Overall balance assessment: Needs assistance Standing balance support: Bilateral upper extremity supported Standing balance-Leahy Scale: Poor  End of Session PT - End of Session Equipment Utilized During Treatment: Gait belt;Back brace Activity Tolerance: Patient tolerated treatment well Patient left: in chair;with call bell/phone within reach;with chair alarm set;with family/visitor present Nurse Communication: Mobility status   GP     Karla Alexander, [place] 06/02/2013, 10:04 AM

## 2013-06-02 NOTE — Clinical Social Work Note (Addendum)
2:04;pm- CSW received a call from [place] at [facility] stating that they WERE able to make a bed offer for pt.  Pt projected to dc to SNF Monday.  [employer] authorization started.  CSW faxed clinicals to [facility] for SNF authorization.    CSW left message with [facility], SNF re: possible admission for pt on Monday.  [name], [place] [phone]  Clinical Social Work

## 2013-06-02 NOTE — Progress Notes (Signed)
Myself and Kizzie Bane used sterile technique with an in and out cath on the patient.  We removed 700 mL of clear, straw colored, no odor urine .

## 2013-06-02 NOTE — Progress Notes (Signed)
Patient's hemo vac drain tube came apart when it became tangled with back brace, appears to have small amount of bloody drainage under transparent dressing. Hemo vac is operating properly and patient is not complaining of any discomfort, will continue to monitor.

## 2013-06-02 NOTE — Clinical Social Work Psychosocial (Signed)
Clinical Social Work Department BRIEF PSYCHOSOCIAL ASSESSMENT 06/02/2013  Patient:  Karla Alexander, Karla Alexander     Account Number:  [id number]     Admit date:  05/31/2013  Clinical Social Worker:  Wylene Men  Date/Time:  06/02/2013 11:25 AM  Referred by:  Physician  Date Referred:  06/02/2013 Referred for  SNF Placement   Other Referral:   none   Interview type:  Other - See comment Other interview type:   Pt and pt neices were at bedside during assessment.  Baker Janus, neice, is POA.    PSYCHOSOCIAL DATA Living Status:  ALONE Admitted from facility:   Level of care:   Primary support name:  Baker Janus Primary support relationship to patient:  FAMILY Degree of support available:   strong support system in place    CURRENT CONCERNS Current Concerns  Post-Acute Placement   Other Concerns:   none    SOCIAL WORK ASSESSMENT / PLAN CSW assessed pt.  Nieces were at bedside and pt request both to remain during assessment.  Pt was sitting in bedside chair drinking coffee presenting pleasant and cooperative.  PT is recommending SNF upon dc.  Pt is agreeable to SNF and states that pt husband is a resident at [facility], [place].  Pt request to be with husband. Elyse Hsu is also agreeable to pt plan of dc.    Pt and neice seemed to be hopeful regarding the pt progress in health.  Pt is excited to be with husband at SNF, but is hopeful to return home after STR.    CSW faxed information to [facility] and surrounding SNFs (as a back up) and will continue to assist with dc plans/needs.   Assessment/plan status:  Psychosocial Support/Ongoing Assessment of Needs Other assessment/ plan:   SNF - [facility]   Information/referral to community resources:   SNF    PATIENT'S/FAMILY'S RESPONSE TO PLAN OF CARE: Pt and neices were all agreeable to SNF at dc.       [name], [place] [phone]  Clinical Social Work

## 2013-06-02 NOTE — Evaluation (Signed)
Occupational Therapy Evaluation Patient Details Name: Karla Alexander MRN: [id number] DOB: Dec 05, 1932 Today's Date: 06/02/2013 Time: 5366-4403 OT Time Calculation (min): 52 min  OT Assessment / Plan / Recommendation History of present illness pt presents with L4-S1 PLIF.     Clinical Impression   Pt presents with below problem list. Pt PLOF was independent with BADLs; assistance for IADLs. Pt lethargic during this OT session likely from medication. Currently she is total assist with all aspects of ADLs except for toilet transfers which she is mod assist +2. Pt would benefit from from further acute OT to increase independence prior to d/c; feel pt would be good candidate for SNF following d/c.    OT Assessment  Patient needs continued OT Services    Follow Up Recommendations  SNF    Barriers to Discharge Decreased caregiver support    Equipment Recommendations       Recommendations for Other Services    Frequency  Min 2X/week    Precautions / Restrictions Precautions Precautions: Fall;Back Required Braces or Orthoses: Spinal Brace Spinal Brace: Lumbar corset Restrictions Weight Bearing Restrictions: No   Pertinent Vitals/Pain Pt does not appear to be in pain.     ADL  Eating/Feeding: +1 Total assistance Where Assessed - Eating/Feeding: Chair Grooming: +1 Total assistance Where Assessed - Grooming: Supported sitting Upper Body Bathing: +1 Total assistance Where Assessed - Upper Body Bathing: Supported sit to stand Lower Body Bathing: +1 Total assistance Where Assessed - Lower Body Bathing: Supported sit to stand Upper Body Dressing: +1 Total assistance Where Assessed - Upper Body Dressing: Supported sit to stand Lower Body Dressing: +1 Total assistance Where Assessed - Lower Body Dressing: Supported sit to Tree surgeon Transfer: +2 mod assistance Toilet Transfer: Patient Percentage:  Toilet Transfer Method: Sit to Loss adjuster, chartered: Public house manager and Hygiene: +1 Total assistance Toileting - Water quality scientist and Hygiene: Patient Percentage: Where Assessed - Best boy and Hygiene: Other (comment) (sit <> stand bsc) Tub/Shower Transfer: +1 Total assistance, pt unable to perform Tub/Shower Transfer: Patient Percentage: Tub/Shower Transfer Method: Ambulating Equipment Used: Back brace;Gait belt;Standard walker Transfers/Ambulation Related to ADLs: +2 mod assist due to cognition and lethargy ADL Comments: pt required total assist for ADLs possibly due to effects of pain medication. Nieces report difficulty in home environment prior to admission.   OT Diagnosis: Generalized weakness;Cognitive deficits  OT Problem List: Decreased strength;Decreased range of motion;Decreased activity tolerance;Impaired balance (sitting and/or standing);Decreased coordination;Decreased cognition;Decreased safety awareness;Decreased knowledge of use of DME or AE;Decreased knowledge of precautions OT Treatment Interventions: Self-care/ADL training;Therapeutic exercise;DME and/or AE instruction;Therapeutic activities;Cognitive remediation/compensation;Patient/family education;Balance training   OT Goals(Current goals can be found in the care plan section) Acute Rehab OT Goals Patient Stated Goal: not stated OT Goal Formulation: With patient/family Time For Goal Achievement: 06/09/13 Potential to Achieve Goals: good ADL Goals Pt Will Perform Grooming: with min guard;sitting;standing Pt Will Transfer to Toilet: with min guard;ambulating;bedside commode  Visit Information  Last OT Received On: 06/02/13 Assistance Needed: +2 History of Present Illness: pt presents with L4-S1 PLIF.         Prior Functioning     Home Living Family/patient expects to be discharged to:: Skilled nursing facility Living Arrangements: Alone Prior Function Level of Independence: Needs assistance ADL's /  Homemaking Assistance Needed: pt needs A for Homemaking and driving.   Communication Communication: Other (comment) (communication limited by lethargy)         Vision/Perception     Cognition  Cognition Arousal/Alertness: Lethargic Behavior During Therapy:  (lethargic) Overall Cognitive Status: History of cognitive impairments per family; pt further impaired likely due to [employer] of Impairment: Attention;Following commands;Memory;Safety/judgement;Awareness;Problem solving Current Attention Level: Focused;Sustained Memory: Decreased recall of precautions;Decreased short-term memory Following Commands: Follows one step commands inconsistently;Follows one step commands with increased time Safety/Judgement: Decreased awareness of safety;Decreased awareness of deficits Problem Solving: Slow processing;Decreased initiation;Difficulty sequencing;Requires verbal cues;Requires tactile cues General Comments:     Extremity/Trunk Assessment Upper Extremity Assessment Upper Extremity Assessment: Generalized weakness;Difficult to assess due to impaired cognition Lower Extremity Assessment Lower Extremity Assessment: Defer to PT evaluation     Mobility Bed Mobility Overal bed mobility: Needs Assistance Bed Mobility: Sit to Sidelying;Rolling Rolling: Max assist Sit to sidelying: Total assist;+2 for physical assistance General bed mobility comments: pt needed max-total assist to adjust position in bed Transfers Overall transfer level: Needs assistance Equipment used: 2 person hand held assist;Rolling walker (2 wheeled) (rw then 2 person hand held assist due to cognition) Transfers: Sit to/from [facility] Sit to Stand: Mod assist;+2 physical assistance Stand pivot transfers: Mod assist with max cues;+2 physical assistance General transfer comment: pt required max verbal and tactile cues     Exercise     Balance Balance Overall balance assessment: Needs  assistance Sitting balance-Leahy Scale: Good Sitting balance - Comments: able to sit in supported seating with supervision Standing balance support: Bilateral upper extremity supported Standing balance-Leahy Scale: Fair   End of Session OT - End of Session Equipment Utilized During Treatment: Gait belt;Rolling walker;Back brace Activity Tolerance: Patient limited by lethargy Patient left: in bed;with call bell/phone within reach;with family/visitor present;with bed alarm set Nurse Communication: Mobility status  GO     Tyrone Schimke OTR/L Pager: [phone] 06/02/2013, 12:01 PM

## 2013-06-03 NOTE — Progress Notes (Signed)
Physical Therapy Treatment Patient Details Name: Karla Alexander MRN: [id number] DOB: 1932-05-17 Today's Date: 06/03/2013 Time: 6812-7517 PT Time Calculation (min): 27 min  PT Assessment / Plan / Recommendation  History of Present Illness pt presents with L4-S1 PLIF.     PT Comments   Patient continues to be unsafe with ambulation with decreased awareness and decreased safety. Patient very pleasently confused and agreeable to ambulation. Continue to recommend SNF for ongoing Physical Therapy.     Follow Up Recommendations  SNF     Does the patient have the potential to tolerate intense rehabilitation     Barriers to Discharge        Equipment Recommendations  Rolling walker with 5" wheels;3in1 (PT)    Recommendations for Other Services    Frequency Min 5X/week   Progress towards PT Goals Progress towards PT goals: Progressing toward goals  Plan Current plan remains appropriate    Precautions / Restrictions Precautions Precautions: Fall;Back Required Braces or Orthoses: Spinal Brace Spinal Brace: Lumbar corset   Pertinent Vitals/Pain no apparent distress    Mobility  Bed Mobility Overal bed mobility: Needs Assistance Rolling: Min assist Sidelying to sit: Min assist General bed mobility comments: A for rolling and for trunk control out of bed. Cues for positioning and technique Transfers Overall transfer level: Needs assistance Equipment used: Rolling walker (2 wheeled) Sit to Stand: Mod assist General transfer comment: Mod A for full upright standing position and cues for safe hand placement. Stood x3 Ambulation/Gait Ambulation/Gait assistance: Museum/gallery curator (Feet): 150 Feet Assistive device: Rolling walker (2 wheeled) Gait Pattern/deviations: Step-through pattern;Decreased stride length General Gait Details: pt continues with short choppy festinating type gait.  pt needs A for balance and RW management as she tends to run into objects.  pt needs  consistent S and cues for following back precautions.      Exercises     PT Diagnosis:    PT Problem List:   PT Treatment Interventions:     PT Goals (current goals can now be found in the care plan section)    Visit Information  Last PT Received On: 06/03/13 Assistance Needed: +1 History of Present Illness: pt presents with L4-S1 PLIF.      Subjective Data      Cognition  Cognition Arousal/Alertness: Awake/alert Overall Cognitive Status: Impaired/Different from baseline Area of Impairment: Attention;Following commands;Memory;Safety/judgement;Awareness;Problem solving Current Attention Level: Focused;Sustained Memory: Decreased recall of precautions;Decreased short-term memory Following Commands: Follows one step commands inconsistently;Follows one step commands with increased time General Comments: difficult time following cues and staying on task    Balance     End of Session PT - End of Session Equipment Utilized During Treatment: Gait belt;Back brace Activity Tolerance: Patient tolerated treatment well Patient left: in chair;with call bell/phone within reach;with chair alarm set;with family/visitor present Nurse Communication: Mobility status   GP     Jacqualyn Posey 06/03/2013, 2:26 PM 06/03/2013 Jacqualyn Posey PTA [phone] pager [phone] office

## 2013-06-03 NOTE — Progress Notes (Signed)
Filed Vitals:   06/02/13 1344 06/02/13 1800 06/02/13 2224 06/03/13 0552  BP: 117/56 148/54 127/38 142/60  Pulse: 81 82 108 90  Temp: 98.3 F (36.8 C) 97.7 F (36.5 C) 98.2 F (36.8 C) 98.2 F (36.8 C)  TempSrc: Oral Oral Oral Oral  Resp: 18 18 20 18   Height:      Weight:      SpO2: 98% 96% 97% 98%    Patient with relatively limited mobility, continueing to work with PT.  Required in and out cath x2 last night, and that 750 cc of urine the bladder, Foley catheter placed by staff. 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  Plan: Continue PT, SNF placement pending.  Hosie Spangle, MD 06/03/2013, 8:21 AM

## 2013-06-03 NOTE — Progress Notes (Signed)
Per Night shift RN [place], foley was placed last night d/t urinary retention after prior in/out cath x2. No complications, amber urine, placed patient back on IVF for hydration.

## 2013-06-04 NOTE — Progress Notes (Signed)
Clinical Education officer, museum (CSW) gave patient bed offers. Patient chose [facility] and reported that her husband is a long term resident at [facility] and has been there five years. Patient asked CSW how much it was going to cost. CSW explained that patient has [facility] and they will have to authorize the SNF stay and will cover a certain number of days depending on the medical documentation that they review. Patient verbalized her understanding.   Blima Rich, [place] Weekend CSW [phone]

## 2013-06-04 NOTE — Progress Notes (Signed)
Foley catheter removed at 1240pm per Dr Arnoldo Morale order. Patient due to void by 1830. Patient tried and was unable to void spontaneously. Bladder scan revealed a max 55cc, however patient c/o abd discomfort and urge to urinate. RN successfully in & out cath and removed 250cc amber urine with sterile technique. Patient expresses relief. Patient placed back on IV fluids in order to help with hydration. Will pass on to night shift RN to monitor urine output and continue voiding trial.

## 2013-06-04 NOTE — Progress Notes (Signed)
Patient ID: Karla Alexander, female   DOB: [birth year], 80 y.o.   MRN: [id number] Subjective:  The patient is alert and pleasant. She is in no apparent distress.  Objective: Vital signs in last 24 hours: Temp:  [98.5 F (36.9 C)-99.6 F (37.6 C)] 98.5 F (36.9 C) (03/15 0935) Pulse Rate:  [75-93] 93 (03/15 0935) Resp:  [18-20] 20 (03/15 0935) BP: (96-126)/(56-90) 104/90 mmHg (03/15 0935) SpO2:  [96 %-100 %] 97 % (03/15 0935)  Intake/Output from previous day: 03/14 0701 - 03/15 0700 In: 1221.3 [P.O.:360; I.V.:861.3] Out: 1000 [Urine:1000] Intake/Output this shift: Total I/O In: 360 [P.O.:360] Out: -   Physical exam the patient is alert and oriented. She is moving her lower extremities well.  Lab Results: No results found for this basename: WBC, HGB, HCT, PLT,  in the last 72 hours BMET No results found for this basename: NA, K, CL, CO2, GLUCOSE, BUN, CREATININE, CALCIUM,  in the last 72 hours  Studies/Results: No results found.  Assessment/Plan: Postop day 4: I will Hep-Lock her IV, discontinue her Foley catheter and mobilize her.  LOS: 4 days     Karla Alexander D 06/04/2013, 9:45 AM

## 2013-06-05 LAB — URINALYSIS, ROUTINE W REFLEX MICROSCOPIC
BILIRUBIN URINE: NEGATIVE
Glucose, UA: NEGATIVE mg/dL
Hgb urine dipstick: NEGATIVE
Ketones, ur: NEGATIVE mg/dL
NITRITE: POSITIVE — AB
PH: 5 (ref 5.0–8.0)
Protein, ur: NEGATIVE mg/dL
Specific Gravity, Urine: 1.012 (ref 1.005–1.030)
Urobilinogen, UA: 0.2 mg/dL (ref 0.0–1.0)

## 2013-06-05 LAB — URINE MICROSCOPIC-ADD ON

## 2013-06-05 MED ORDER — SULFAMETHOXAZOLE-TMP DS 800-160 MG PO TABS
1.0000 | ORAL_TABLET | Freq: Two times a day (BID) | ORAL | Status: DC
  Administered 2013-06-05 – 2013-06-06 (×3): 1 via ORAL
  Filled 2013-06-05 (×5): qty 1

## 2013-06-05 MED ORDER — HYDROCODONE-ACETAMINOPHEN 5-325 MG PO TABS: 1.0000 | ORAL_TABLET | Freq: Four times a day (QID) | ORAL | Status: DC | PRN

## 2013-06-05 NOTE — Discharge Summary (Signed)
Physician Discharge Summary  Patient ID: Karla Alexander MRN: [id number] DOB/AGE: [birth year] 80 y.o.  Admit date: 05/31/2013 Discharge date: 06/05/2013  Admission Diagnoses: spondylolisthesis with stenosis    Discharge Diagnoses: same   Discharged Condition: stable  Hospital Course: The patient was admitted on 05/31/2013 and taken to the operating room where the patient underwent PLIF L4-5 L5-s1. The patient tolerated the procedure well and was taken to the recovery room and then to the floor in stable condition. The hospital course was fairly routine, though she was slow to mobilize. There were no complications. The wound remained clean dry and intact. Pt had appropriate back soreness. No complaints of leg pain or new N/T/W. The patient remained afebrile with stable vital signs, and tolerated a regular diet. The patient continued to increase activities, and pain was well controlled with oral pain medications.   Consults: None  Significant Diagnostic Studies:  Results for orders placed during the hospital encounter of 05/31/13  GLUCOSE, CAPILLARY      Result Value Ref Range   Glucose-Capillary 131 (*) 70 - 99 mg/dL   Comment 1 Documented in Chart     Comment 2 Notify RN    TYPE AND SCREEN      Result Value Ref Range   ABO/RH(D) O POS     Antibody Screen NEG     Sample Expiration 06/03/2013      Chest 2 View  05/15/2013   CLINICAL DATA:  Preop evaluation  EXAM: CHEST  2 VIEW  COMPARISON:  None.  FINDINGS: Low lung volumes. Cardiac silhouette is enlarged. There is diffuse prominence of the interstitial markings. There is no evidence peribronchial cuffing nor effusions. No focal region consolidation or focal infiltrates. The osseous structures are unremarkable.  IMPRESSION: Interstitial prominence likely representing underlying chronic bronchitic changes. No focal regions of consolidation or focal infiltrates. Unless clinically appropriate an infectious or inflammatory interstitial  infiltrate is of much lower differential consideration.   Electronically Signed   By: Margaree Mackintosh M.D.   On: 05/15/2013 12:16   Dg Lumbar Spine 2-3 Views  05/31/2013   CLINICAL DATA L4-S1 decompression and fusion.  EXAM LUMBAR SPINE - 2-3 VIEW  COMPARISON 03/02/2013  FINDINGS Two C-arm images show posterior decompression at L4-5 and L5-S1 with discectomy, placement of interbody fusion material, bilateral pedicle screws and posterior rods. No detectable complication.  IMPRESSION Diskectomy, decompression and fusion from L4 to the sacrum.  SIGNATURE  Electronically Signed   By: Nelson Chimes M.D.   On: 05/31/2013 16:40    Antibiotics:  Anti-infectives   Start     Dose/Rate Route Frequency Ordered Stop   05/31/13 2030  ceFAZolin (ANCEF) IVPB 1 g/50 mL premix     1 g 100 mL/hr over 30 Minutes Intravenous Every 8 hours 05/31/13 1931 06/01/13 0526   05/31/13 1313  bacitracin 50,000 Units in sodium chloride irrigation 0.9 % 500 mL irrigation  Status:  Discontinued       As needed 05/31/13 1313 05/31/13 1613   05/31/13 0600  ceFAZolin (ANCEF) IVPB 2 g/50 mL premix     2 g 100 mL/hr over 30 Minutes Intravenous On call to O.R. 05/30/13 1410 05/31/13 1227      Discharge Exam: Blood pressure 119/72, pulse 80, temperature 97.8 F (36.6 C), temperature source Oral, resp. rate 22, height 5\' 1"  (1.549 m), weight 76.4 kg (168 lb 6.9 oz), SpO2 100.00%. Neurologic: Grossly normal Incision CDI  Discharge Medications:     Medication List    STOP  taking these medications       acetaminophen 325 MG tablet  Commonly known as:  TYLENOL      TAKE these medications       ADVAIR DISKUS IN  Inhale 1 puff into the lungs 2 (two) times daily.     albuterol 108 (90 BASE) MCG/ACT inhaler  Commonly known as:  PROVENTIL HFA;VENTOLIN HFA  Inhale 1 puff into the lungs every 6 (six) hours as needed for wheezing or shortness of breath.     alendronate 70 MG tablet  Commonly known as:  FOSAMAX  Take 70 mg by  mouth once a week. Take with a full glass of water on an empty stomach.     aspirin 325 MG tablet  Take 325 mg by mouth daily.     aspirin-sod bicarb-citric acid 325 MG Tbef tablet  Commonly known as:  ALKA-SELTZER  Take 325 mg by mouth every 6 (six) hours as needed.     calcium carbonate 600 MG Tabs tablet  Commonly known as:  OS-CAL  Take 600 mg by mouth 2 (two) times daily with a meal.     citalopram 20 MG tablet  Commonly known as:  CELEXA  Take 20 mg by mouth daily.     clonazePAM 0.5 MG tablet  Commonly known as:  KLONOPIN  Take 0.5 mg by mouth 2 (two) times daily as needed (for restless legs).     docusate sodium 100 MG capsule  Commonly known as:  COLACE  Take 100 mg by mouth daily as needed for mild constipation.     fluticasone 50 MCG/ACT nasal spray  Commonly known as:  FLONASE  Place 1 spray into both nostrils daily.     gabapentin 300 MG capsule  Commonly known as:  NEURONTIN  Take 300 mg by mouth 2 (two) times daily as needed (for pain).     HYDROcodone-acetaminophen 5-325 MG per tablet  Commonly known as:  NORCO/VICODIN  Take 1-2 tablets by mouth every 6 (six) hours as needed for moderate pain.     levothyroxine 100 MCG tablet  Commonly known as:  SYNTHROID, LEVOTHROID  Take 100 mcg by mouth daily before breakfast.     lovastatin 20 MG tablet  Commonly known as:  MEVACOR  Take 20 mg by mouth.     meloxicam 15 MG tablet  Commonly known as:  MOBIC  Take 15 mg by mouth daily as needed for pain.     metoprolol succinate 25 MG 24 hr tablet  Commonly known as:  TOPROL-XL  Take 25 mg by mouth daily.     omeprazole 20 MG capsule  Commonly known as:  PRILOSEC  Take 20 mg by mouth 2 (two) times daily before a meal.     triamterene-hydrochlorothiazide 37.5-25 MG per capsule  Commonly known as:  DYAZIDE  Take 1 capsule by mouth daily.     vitamin B-12 1000 MCG tablet  Commonly known as:  CYANOCOBALAMIN  Take 1,000 mcg by mouth daily.     Vitamin D3  5000 UNITS Caps  Take 1 capsule by mouth daily.        Disposition: SNF   Final Dx: PLIF L4-5 L5-S1      Discharge Orders   Future Orders Complete By Expires    Remove dressing in 72 hours  As directed    Call MD for:  difficulty breathing, headache or visual disturbances  As directed    Call MD for:  persistant nausea and vomiting  As directed    Call MD for:  redness, tenderness, or signs of infection (pain, swelling, redness, odor or green/yellow discharge around incision site)  As directed    Call MD for:  severe uncontrolled pain  As directed    Call MD for:  temperature >100.4  As directed    Diet - low sodium heart healthy  As directed    Discharge instructions  As directed    Comments:     No bending or twisting, wear brace when OOB walking, may shower normally   Increase activity slowly  As directed       Follow-up Information   Follow up with Jimy Gates S, MD. Schedule an appointment as soon as possible for a visit in 3 weeks.   Specialty:  Neurosurgery   Contact information:   [address] [place] [address] [phone]        Signed: Eustace Moore 06/05/2013, 9:18 AM

## 2013-06-05 NOTE — Progress Notes (Signed)
Patient's UA was positive for UTI.  Dr. Ronnald Ramp paged and orders given for antibiotic therapy.  Patient and family were informed.  Will continue to monitor.  Kizzie Bane, RN

## 2013-06-05 NOTE — Progress Notes (Signed)
Patient ID: Karla Alexander, female   DOB: 02/10/1933, 80 y.o.   MRN: [id number] Subjective: Patient reports some back soreness, no leg pain at present  Objective: Vital signs in last 24 hours: Temp:  [97.8 F (36.6 C)-98.5 F (36.9 C)] 97.8 F (36.6 C) (03/16 0448) Pulse Rate:  [78-93] 80 (03/16 0448) Resp:  [20-22] 22 (03/16 0448) BP: (104-139)/(45-90) 119/72 mmHg (03/16 0448) SpO2:  [97 %-100 %] 100 % (03/16 0448)  Intake/Output from previous day: 03/15 0701 - 03/16 0700 In: 720 [P.O.:720] Out: 2050 [Urine:2050] Intake/Output this shift: Total I/O In: 360 [P.O.:360] Out: -   Neurologic: Grossly normal , seems to have good strength  Lab Results: Lab Results  Component Value Date   WBC 8.2 05/15/2013   HGB 13.8 05/15/2013   HCT 40.3 05/15/2013   MCV 88.8 05/15/2013   PLT 293 05/15/2013   Lab Results  Component Value Date   INR 0.93 05/15/2013   BMET Lab Results  Component Value Date   NA 141 05/15/2013   K 4.0 05/15/2013   CL 101 05/15/2013   CO2 28 05/15/2013   GLUCOSE 103* 05/15/2013   BUN 13 05/15/2013   CREATININE 1.02 05/15/2013   CALCIUM 10.0 05/15/2013    Studies/Results: No results found.  Assessment/Plan: PT/OT, SNF vs home?   LOS: 5 days    JONES,DAVID S 06/05/2013, 9:08 AM

## 2013-06-05 NOTE — Clinical Social Work Note (Addendum)
2:46pm- CSW has not received a [employer] authorization to [employer] pt to SNF.  [employer] computer system is down.  CSW contacted Claiborne Billings, hospital representative for [employer] who verified that pt has [employer] and not [facility].  No authorization can be obtained until system is back up.  Asst CSW Director aware.  1:36pm - CSW faxed dc summary to [facility].  Pt will be transported via [facility] once approval from facility has been received for pt to be admitted today.  [name], [place] [phone]  Clinical Social Work

## 2013-06-05 NOTE — Progress Notes (Signed)
Patient unable to void.  Dr. Ronnald Ramp ordered a UA.  Myself, and Gerlene Fee, RN obtained a urine sample by using and in and out catheter.  Sterile technique was maintained and patient tolerated procedure well.  Urine tubed to lab.  Kizzie Bane, RN

## 2013-06-06 NOTE — Discharge Summary (Signed)
  This patient required an extra night in the hospital because of the diagnosis of urinary tract infection with urinary retention.  She was started on Bactrim DS one by mouth twice a day for 5 days.  We had to replace the Foley catheter.  Hopefully this can be discontinued in the next couple of days at the nursing home should her urinary tract infection cleared and her normal urination and continue.  If not she will need follow-up with urologist to determine the cause of her urinary retention.  She'll be discharged to the skilled nursing facility with a Foley catheter in place

## 2013-06-06 NOTE — Clinical Social Work Note (Signed)
Pt discharging to [facility]. Discharge summary has been faxed to [facility]. Discharge packet complete and on pt's shadow chart. CSW has arranged for ambulance transportation. RN notified.  Pati Gallo, [place] Social Worker [phone]

## 2013-06-06 NOTE — Clinical Social Work Placement (Deleted)
Deleted note

## 2013-06-06 NOTE — Progress Notes (Signed)
Pt attempted to void several times unsuccessfully. Bladder scan performed showing 684. Foley reinserted. 1200 returned. MD notified.

## 2013-06-06 NOTE — Progress Notes (Signed)
Physical Therapy Treatment Patient Details Name: [place] MRN: [id number] DOB: 01-Oct-1932 Today's Date: 06/06/2013 Time: 0768-0881 PT Time Calculation (min): 16 min  PT Assessment / Plan / Recommendation  History of Present Illness pt presents with L4-S1 PLIF.     PT Comments   Patient appears to be more limited and requiring increased assistance with ambulation. Patient appears more anxious this session than previous session. Continue to recommend SNF for ongoing Physical Therapy.     Follow Up Recommendations  SNF     Does the patient have the potential to tolerate intense rehabilitation     Barriers to Discharge        Equipment Recommendations  Rolling walker with 5" wheels;3in1 (PT)    Recommendations for Other Services    Frequency Min 5X/week   Progress towards PT Goals Progress towards PT goals: Not progressing toward goals - comment  Plan Current plan remains appropriate    Precautions / Restrictions Precautions Precautions: Fall;Back Required Braces or Orthoses: Spinal Brace Spinal Brace: Lumbar corset   Pertinent Vitals/Pain no apparent distress    Mobility  Bed Mobility General bed mobility comments: Patient sitting up in recliner upon entering room Transfers Overall transfer level: Needs assistance Equipment used: Rolling walker (2 wheeled) Sit to Stand: Mod assist General transfer comment: Mod A for full upright standing position and cues for safe hand placement. Stood Gaffer. Patient with heavier posterior lean this session  Ambulation/Gait Ambulation/Gait assistance: Mod assist Ambulation Distance (Feet): 50 Feet Assistive device: 1 person hand held assist Gait Pattern/deviations: Step-to pattern General Gait Details: pt continues with short choppy festinating type gait.  Patient required A to prevent falling. Patient did not use RW this session and she was unebal to use appropriately    Exercises     PT Diagnosis:    PT Problem List:   PT  Treatment Interventions:     PT Goals (current goals can now be found in the care plan section)    Visit Information  Last PT Received On: 06/06/13 Assistance Needed: +1 History of Present Illness: pt presents with L4-S1 PLIF.      Subjective Data      Cognition  Cognition Arousal/Alertness: Awake/alert Overall Cognitive Status: Impaired/Different from baseline Area of Impairment: Attention;Following commands;Memory;Safety/judgement;Awareness;Problem solving Current Attention Level: Focused;Sustained Memory: Decreased recall of precautions;Decreased short-term memory Following Commands: Follows one step commands inconsistently;Follows one step commands with increased time Safety/Judgement: Decreased awareness of safety;Decreased awareness of deficits Problem Solving: Slow processing;Decreased initiation;Difficulty sequencing;Requires verbal cues;Requires tactile cues General Comments: difficult time following cues and staying on task. Patient stopped throughout several times and had to be redirected    Balance     End of Session PT - End of Session Equipment Utilized During Treatment: Gait belt;Back brace Activity Tolerance: Patient limited by pain;Patient limited by fatigue Patient left: in chair;with call bell/phone within reach;with chair alarm set;with family/visitor present Nurse Communication: Mobility status   GP     Jacqualyn Posey 06/06/2013, 11:46 AM 06/06/2013 Jacqualyn Posey PTA [phone] pager [phone] office

## 2013-06-06 NOTE — Clinical Social Work Psychosocial (Addendum)
Clinical Social Work Department BRIEF PSYCHOSOCIAL ASSESSMENT 06/05/2013  Patient:  Karla Alexander     Account Number:  [id number]     Admit date:  06/01/2013  Clinical Social Worker:  Wylene Men  Date/Time:  06/05/2013 10:54 AM  Referred by:  Physician  Date Referred:  06/05/2013 Referred for  SNF Placement   Other Referral:   none   Interview type:  Other - See comment Other interview type:   Pt and pt brother    PSYCHOSOCIAL DATA Living Status:  ALONE Admitted from facility:   Level of care:   Primary support name:  Karla Alexander Primary support relationship to patient:  S[place]LING Degree of support available:   adequate    CURRENT CONCERNS Current Concerns  Post-Acute Placement   Other Concerns:   lack of support/supervision at home    [name]    CSW and RNCM jointly assessed pt for dispsition needs.  Pt was alert and oriented.  Pt was sitting on bedside couch during assessment.  Pt expresses his wishes to go home. Brother frustrrated with this decision.  Pt reluctantly agreeable to SNF search.  RNCM will provide HH as back up.    3/17- CSW, RNCM and MD met with pt to review disposition plans. Pt brother not present.  Pt expressed frustration regarding his dx of aphasia and reports that he really wishes to go home.  MD is agreeable to this disposition, but strongly suggests pt go to SNF prior to home.  Pt refuses SNF and RNCM is currently working with pt to dc home with [name].   Assessment/plan status:  Referral to [name] Other assessment/ plan:     RNCM  HH ST   Information/referral to community resources:   HH ST and [place] SW  RNCM    PATIENT'S/FAMILY'S RESPONSE TO PLAN OF CARE: Pt was not agreeable to SNF, but is agreeable to [place] signing off.       Karla Alexander, [place] [phone]  Clinical Social Work

## 2013-06-06 NOTE — Progress Notes (Signed)
Pt d/c to SNF by ambulance. Assessment stable.

## 2013-06-06 NOTE — Progress Notes (Signed)
Occupational Therapy Treatment Patient Details Name: Karla Alexander MRN: [id number] DOB: 1932/04/14 Today's Date: 06/06/2013 Time: 1062-6948 OT Time Calculation (min): 25 min  OT Assessment / Plan / Recommendation  History of present illness pt presents with L4-S1 PLIF.     OT comments  Pt. With notable changes in behavior from previous documentation.  Very "suspicous" and jerky during session.  Mumbling and whispering when answering questions. Declines all oob/eob secondary to recent return to bed.  Did assist with bed mobility and agreeable to some grooming tasks with hob elevated.                            Frequency Min 2X/week   Progress towards OT Goals Progress towards OT goals: Progressing toward goals  Plan Discharge plan remains appropriate    Precautions / Restrictions Precautions Precautions: Fall;Back Required Braces or Orthoses: Spinal Brace Spinal Brace: Lumbar corset   Pertinent Vitals/Pain No c/o     ADL  Grooming: Performed;Wash/dry face;Wash/dry hands;Set up Where Assessed - Grooming: Supine, head of bed up Transfers/Ambulation Related to ADLs: bed mobility in prep for eob/oob as pt. was very "suspicious"  ADL Comments: set up for washing face and hands.  when offered to assist with oral care pt. states "i am very private about that" and declined eob for denture care.       OT Goals(current goals can now be found in the care plan section)    Visit Information  Last OT Received On: 06/06/13 Assistance Needed: +1 History of Present Illness: pt presents with L4-S1 PLIF.      Subjective Data   "i need to be in the b.room with the door closed to clean my teeth" ( in response to offering oral care eob)          Cognition  Cognition Arousal/Alertness: Awake/alert Overall Cognitive Status: Impaired/Different from baseline Area of Impairment: Attention;Following commands;Memory;Safety/judgement;Awareness;Problem solving Current Attention Level:  Focused;Sustained Memory: Decreased recall of precautions;Decreased short-term memory Following Commands: Follows one step commands inconsistently;Follows one step commands with increased time Safety/Judgement: Decreased awareness of safety;Decreased awareness of deficits Problem Solving: Slow processing;Decreased initiation;Difficulty sequencing;Requires verbal cues;Requires tactile cues General Comments: very "suspicous" a lot of in complete sentances and mumbling and then would say "ill be alright, ill be ok" jerking a lot and con't. to look around the room before answering questions.    Mobility  Bed Mobility General bed mobility comments: agreed to bed mobility but not oob secondary to recently returning to bed.  max inst. cues for assistance to move pt. up in bed, mod a Transfers Overall transfer level: Needs assistance Equipment used: Rolling walker (2 wheeled) Sit to Stand: Mod assist General transfer comment: pt. declined oob or eob              End of Session OT - End of Session Activity Tolerance: Patient limited by lethargy;Other (comment) (very suspicous and jerky nature through out session) Patient left: in bed;with call bell/phone within reach;with bed alarm set       Janice Coffin, COTA/L 06/06/2013, 3:07 PM

## 2013-06-06 NOTE — Clinical Social Work Note (Addendum)
1:09pm- CSW received notification from [place] at [facility] that authorization has been given.  Doug at [employer] to verbal authorization and agreeable for pt to be admitted.  RN made aware.    12:19pm- CSW was notified by [employer] rep, that updated clinicals were not received even though CSW has confirmation that fax was transmitted successfully both Friday and Monday.  CSW will refax updated clinicals to Sherlynn Stalls at [phone].    11:40am- CSW attempted to contact [facility] regarding insurance authorization.  CSW left Doug at [facility], SNF a message regarding his attempts to also obtain insurance authorization.   CSW contacted hospital [facility] and was told this pt is not [place]. Pt is [place] and was given [phone] after speaking to a representative, CSW was given an additional [facility] # [phone] which was for inpatient precertification (not SNF authorization).  CSW left message with Sherlynn Stalls at [facility] which is the SNFs contact.  She can be reached at [phone] x [phone].  CSW and Doug at [facility] left a message with Sherlynn Stalls yesterday when [facility] computer system was down.  CSW nor SNF Admission's director have received a return call.    CSW seeking assistance from [place] Asst Director.  [name], [place] [phone]  Clinical Social Work

## 2013-06-10 ENCOUNTER — Emergency Department: Payer: Self-pay | Admitting: Emergency Medicine

## 2013-06-10 LAB — COMPREHENSIVE METABOLIC PANEL
ALK PHOS: 110 U/L
ALT: 27 U/L (ref 12–78)
Albumin: 3.6 g/dL (ref 3.4–5.0)
Anion Gap: 6 — ABNORMAL LOW (ref 7–16)
BUN: 8 mg/dL (ref 7–18)
Bilirubin,Total: 0.4 mg/dL (ref 0.2–1.0)
CHLORIDE: 105 mmol/L (ref 98–107)
Calcium, Total: 8.9 mg/dL (ref 8.5–10.1)
Co2: 27 mmol/L (ref 21–32)
Creatinine: 1.18 mg/dL (ref 0.60–1.30)
EGFR (African American): 51 — ABNORMAL LOW
GFR CALC NON AF AMER: 44 — AB
Glucose: 115 mg/dL — ABNORMAL HIGH (ref 65–99)
Osmolality: 275 (ref 275–301)
Potassium: 3.5 mmol/L (ref 3.5–5.1)
SGOT(AST): 38 U/L — ABNORMAL HIGH (ref 15–37)
Sodium: 138 mmol/L (ref 136–145)
TOTAL PROTEIN: 6.6 g/dL (ref 6.4–8.2)

## 2013-06-10 LAB — URINALYSIS, COMPLETE
BILIRUBIN, UR: NEGATIVE
GLUCOSE, UR: NEGATIVE mg/dL (ref 0–75)
KETONE: NEGATIVE
NITRITE: NEGATIVE
PH: 6 (ref 4.5–8.0)
RBC,UR: 7375 /HPF (ref 0–5)
Specific Gravity: 1.022 (ref 1.003–1.030)

## 2013-06-10 LAB — CBC
HCT: 28.8 % — AB (ref 35.0–47.0)
HGB: 9.5 g/dL — AB (ref 12.0–16.0)
MCH: 29.4 pg (ref 26.0–34.0)
MCHC: 33 g/dL (ref 32.0–36.0)
MCV: 89 fL (ref 80–100)
PLATELETS: 407 10*3/uL (ref 150–440)
RBC: 3.23 10*6/uL — AB (ref 3.80–5.20)
RDW: 13.9 % (ref 11.5–14.5)
WBC: 9.2 10*3/uL (ref 3.6–11.0)

## 2013-06-21 DIAGNOSIS — J45909 Unspecified asthma, uncomplicated: Secondary | ICD-10-CM | POA: Insufficient documentation

## 2013-06-21 DIAGNOSIS — M4800 Spinal stenosis, site unspecified: Secondary | ICD-10-CM | POA: Insufficient documentation

## 2013-06-21 DIAGNOSIS — R0609 Other forms of dyspnea: Secondary | ICD-10-CM | POA: Insufficient documentation

## 2013-06-21 DIAGNOSIS — J449 Chronic obstructive pulmonary disease, unspecified: Secondary | ICD-10-CM | POA: Insufficient documentation

## 2013-06-21 DIAGNOSIS — K219 Gastro-esophageal reflux disease without esophagitis: Secondary | ICD-10-CM | POA: Insufficient documentation

## 2013-06-21 DIAGNOSIS — F3340 Major depressive disorder, recurrent, in remission, unspecified: Secondary | ICD-10-CM | POA: Insufficient documentation

## 2013-08-07 DIAGNOSIS — R270 Ataxia, unspecified: Secondary | ICD-10-CM | POA: Insufficient documentation

## 2013-08-07 DIAGNOSIS — D649 Anemia, unspecified: Secondary | ICD-10-CM | POA: Insufficient documentation

## 2014-01-31 ENCOUNTER — Emergency Department: Payer: Self-pay | Admitting: Emergency Medicine

## 2014-01-31 LAB — BASIC METABOLIC PANEL
Anion Gap: 6 — ABNORMAL LOW (ref 7–16)
BUN: 21 mg/dL — ABNORMAL HIGH (ref 7–18)
CO2: 28 mmol/L (ref 21–32)
CREATININE: 1.45 mg/dL — AB (ref 0.60–1.30)
Calcium, Total: 8.7 mg/dL (ref 8.5–10.1)
Chloride: 107 mmol/L (ref 98–107)
EGFR (Non-African Amer.): 37 — ABNORMAL LOW
GFR CALC AF AMER: 45 — AB
GLUCOSE: 126 mg/dL — AB (ref 65–99)
OSMOLALITY: 286 (ref 275–301)
POTASSIUM: 3.9 mmol/L (ref 3.5–5.1)
SODIUM: 141 mmol/L (ref 136–145)

## 2014-01-31 LAB — URINALYSIS, COMPLETE
BILIRUBIN, UR: NEGATIVE
Blood: NEGATIVE
Glucose,UR: NEGATIVE mg/dL (ref 0–75)
Ketone: NEGATIVE
Leukocyte Esterase: NEGATIVE
Nitrite: NEGATIVE
Ph: 6 (ref 4.5–8.0)
Protein: NEGATIVE
SPECIFIC GRAVITY: 1.013 (ref 1.003–1.030)
Squamous Epithelial: <1
WBC UR: 15 /HPF (ref 0–5)

## 2014-01-31 LAB — CBC
HCT: 30.9 % — ABNORMAL LOW (ref 35.0–47.0)
HGB: 10 g/dL — AB (ref 12.0–16.0)
MCH: 28.4 pg (ref 26.0–34.0)
MCHC: 32.3 g/dL (ref 32.0–36.0)
MCV: 88 fL (ref 80–100)
PLATELETS: 253 10*3/uL (ref 150–440)
RBC: 3.5 10*6/uL — ABNORMAL LOW (ref 3.80–5.20)
RDW: 17.7 % — AB (ref 11.5–14.5)
WBC: 14.8 10*3/uL — ABNORMAL HIGH (ref 3.6–11.0)

## 2014-03-28 DIAGNOSIS — M25512 Pain in left shoulder: Secondary | ICD-10-CM | POA: Diagnosis not present

## 2014-03-28 DIAGNOSIS — S42292D Other displaced fracture of upper end of left humerus, subsequent encounter for fracture with routine healing: Secondary | ICD-10-CM | POA: Diagnosis not present

## 2014-03-29 DIAGNOSIS — I2510 Atherosclerotic heart disease of native coronary artery without angina pectoris: Secondary | ICD-10-CM | POA: Diagnosis not present

## 2014-03-29 DIAGNOSIS — R489 Unspecified symbolic dysfunctions: Secondary | ICD-10-CM | POA: Diagnosis not present

## 2014-03-29 DIAGNOSIS — S42202D Unspecified fracture of upper end of left humerus, subsequent encounter for fracture with routine healing: Secondary | ICD-10-CM | POA: Diagnosis not present

## 2014-03-29 DIAGNOSIS — F329 Major depressive disorder, single episode, unspecified: Secondary | ICD-10-CM | POA: Diagnosis not present

## 2014-03-29 DIAGNOSIS — K219 Gastro-esophageal reflux disease without esophagitis: Secondary | ICD-10-CM | POA: Diagnosis not present

## 2014-03-29 DIAGNOSIS — E039 Hypothyroidism, unspecified: Secondary | ICD-10-CM | POA: Diagnosis not present

## 2014-03-29 DIAGNOSIS — Z9181 History of falling: Secondary | ICD-10-CM | POA: Diagnosis not present

## 2014-03-29 DIAGNOSIS — M4806 Spinal stenosis, lumbar region: Secondary | ICD-10-CM | POA: Diagnosis not present

## 2014-03-29 DIAGNOSIS — I10 Essential (primary) hypertension: Secondary | ICD-10-CM | POA: Diagnosis not present

## 2014-03-30 DIAGNOSIS — E039 Hypothyroidism, unspecified: Secondary | ICD-10-CM | POA: Diagnosis not present

## 2014-03-30 DIAGNOSIS — F329 Major depressive disorder, single episode, unspecified: Secondary | ICD-10-CM | POA: Diagnosis not present

## 2014-03-30 DIAGNOSIS — R489 Unspecified symbolic dysfunctions: Secondary | ICD-10-CM | POA: Diagnosis not present

## 2014-03-30 DIAGNOSIS — Z9181 History of falling: Secondary | ICD-10-CM | POA: Diagnosis not present

## 2014-03-30 DIAGNOSIS — M4806 Spinal stenosis, lumbar region: Secondary | ICD-10-CM | POA: Diagnosis not present

## 2014-03-30 DIAGNOSIS — K219 Gastro-esophageal reflux disease without esophagitis: Secondary | ICD-10-CM | POA: Diagnosis not present

## 2014-03-30 DIAGNOSIS — I2510 Atherosclerotic heart disease of native coronary artery without angina pectoris: Secondary | ICD-10-CM | POA: Diagnosis not present

## 2014-03-30 DIAGNOSIS — I10 Essential (primary) hypertension: Secondary | ICD-10-CM | POA: Diagnosis not present

## 2014-03-30 DIAGNOSIS — S42202D Unspecified fracture of upper end of left humerus, subsequent encounter for fracture with routine healing: Secondary | ICD-10-CM | POA: Diagnosis not present

## 2014-04-03 DIAGNOSIS — M4806 Spinal stenosis, lumbar region: Secondary | ICD-10-CM | POA: Diagnosis not present

## 2014-04-03 DIAGNOSIS — I10 Essential (primary) hypertension: Secondary | ICD-10-CM | POA: Diagnosis not present

## 2014-04-03 DIAGNOSIS — Z9181 History of falling: Secondary | ICD-10-CM | POA: Diagnosis not present

## 2014-04-03 DIAGNOSIS — I2510 Atherosclerotic heart disease of native coronary artery without angina pectoris: Secondary | ICD-10-CM | POA: Diagnosis not present

## 2014-04-03 DIAGNOSIS — R489 Unspecified symbolic dysfunctions: Secondary | ICD-10-CM | POA: Diagnosis not present

## 2014-04-03 DIAGNOSIS — F329 Major depressive disorder, single episode, unspecified: Secondary | ICD-10-CM | POA: Diagnosis not present

## 2014-04-03 DIAGNOSIS — E039 Hypothyroidism, unspecified: Secondary | ICD-10-CM | POA: Diagnosis not present

## 2014-04-03 DIAGNOSIS — S42202D Unspecified fracture of upper end of left humerus, subsequent encounter for fracture with routine healing: Secondary | ICD-10-CM | POA: Diagnosis not present

## 2014-04-03 DIAGNOSIS — K219 Gastro-esophageal reflux disease without esophagitis: Secondary | ICD-10-CM | POA: Diagnosis not present

## 2014-04-05 DIAGNOSIS — Z9181 History of falling: Secondary | ICD-10-CM | POA: Diagnosis not present

## 2014-04-05 DIAGNOSIS — S42202D Unspecified fracture of upper end of left humerus, subsequent encounter for fracture with routine healing: Secondary | ICD-10-CM | POA: Diagnosis not present

## 2014-04-05 DIAGNOSIS — E039 Hypothyroidism, unspecified: Secondary | ICD-10-CM | POA: Diagnosis not present

## 2014-04-05 DIAGNOSIS — I10 Essential (primary) hypertension: Secondary | ICD-10-CM | POA: Diagnosis not present

## 2014-04-05 DIAGNOSIS — I2510 Atherosclerotic heart disease of native coronary artery without angina pectoris: Secondary | ICD-10-CM | POA: Diagnosis not present

## 2014-04-05 DIAGNOSIS — M4806 Spinal stenosis, lumbar region: Secondary | ICD-10-CM | POA: Diagnosis not present

## 2014-04-05 DIAGNOSIS — R489 Unspecified symbolic dysfunctions: Secondary | ICD-10-CM | POA: Diagnosis not present

## 2014-04-05 DIAGNOSIS — F329 Major depressive disorder, single episode, unspecified: Secondary | ICD-10-CM | POA: Diagnosis not present

## 2014-04-05 DIAGNOSIS — K219 Gastro-esophageal reflux disease without esophagitis: Secondary | ICD-10-CM | POA: Diagnosis not present

## 2014-04-06 DIAGNOSIS — I10 Essential (primary) hypertension: Secondary | ICD-10-CM | POA: Diagnosis not present

## 2014-04-06 DIAGNOSIS — S42202D Unspecified fracture of upper end of left humerus, subsequent encounter for fracture with routine healing: Secondary | ICD-10-CM | POA: Diagnosis not present

## 2014-04-06 DIAGNOSIS — K219 Gastro-esophageal reflux disease without esophagitis: Secondary | ICD-10-CM | POA: Diagnosis not present

## 2014-04-06 DIAGNOSIS — R489 Unspecified symbolic dysfunctions: Secondary | ICD-10-CM | POA: Diagnosis not present

## 2014-04-06 DIAGNOSIS — I2510 Atherosclerotic heart disease of native coronary artery without angina pectoris: Secondary | ICD-10-CM | POA: Diagnosis not present

## 2014-04-06 DIAGNOSIS — M4806 Spinal stenosis, lumbar region: Secondary | ICD-10-CM | POA: Diagnosis not present

## 2014-04-06 DIAGNOSIS — Z9181 History of falling: Secondary | ICD-10-CM | POA: Diagnosis not present

## 2014-04-06 DIAGNOSIS — E039 Hypothyroidism, unspecified: Secondary | ICD-10-CM | POA: Diagnosis not present

## 2014-04-06 DIAGNOSIS — F329 Major depressive disorder, single episode, unspecified: Secondary | ICD-10-CM | POA: Diagnosis not present

## 2014-04-10 DIAGNOSIS — I10 Essential (primary) hypertension: Secondary | ICD-10-CM | POA: Diagnosis not present

## 2014-04-10 DIAGNOSIS — E039 Hypothyroidism, unspecified: Secondary | ICD-10-CM | POA: Diagnosis not present

## 2014-04-10 DIAGNOSIS — K219 Gastro-esophageal reflux disease without esophagitis: Secondary | ICD-10-CM | POA: Diagnosis not present

## 2014-04-10 DIAGNOSIS — I2510 Atherosclerotic heart disease of native coronary artery without angina pectoris: Secondary | ICD-10-CM | POA: Diagnosis not present

## 2014-05-02 DIAGNOSIS — F419 Anxiety disorder, unspecified: Secondary | ICD-10-CM | POA: Diagnosis not present

## 2014-05-02 DIAGNOSIS — F329 Major depressive disorder, single episode, unspecified: Secondary | ICD-10-CM | POA: Diagnosis not present

## 2014-05-02 DIAGNOSIS — E784 Other hyperlipidemia: Secondary | ICD-10-CM | POA: Diagnosis not present

## 2014-05-02 DIAGNOSIS — I10 Essential (primary) hypertension: Secondary | ICD-10-CM | POA: Diagnosis not present

## 2014-05-08 DIAGNOSIS — E039 Hypothyroidism, unspecified: Secondary | ICD-10-CM | POA: Diagnosis not present

## 2014-05-08 DIAGNOSIS — I2510 Atherosclerotic heart disease of native coronary artery without angina pectoris: Secondary | ICD-10-CM | POA: Diagnosis not present

## 2014-05-09 DIAGNOSIS — S42292P Other displaced fracture of upper end of left humerus, subsequent encounter for fracture with malunion: Secondary | ICD-10-CM | POA: Diagnosis not present

## 2014-05-09 DIAGNOSIS — S42292D Other displaced fracture of upper end of left humerus, subsequent encounter for fracture with routine healing: Secondary | ICD-10-CM | POA: Diagnosis not present

## 2014-06-13 DIAGNOSIS — D649 Anemia, unspecified: Secondary | ICD-10-CM | POA: Diagnosis not present

## 2014-06-13 DIAGNOSIS — E039 Hypothyroidism, unspecified: Secondary | ICD-10-CM | POA: Diagnosis not present

## 2014-06-13 DIAGNOSIS — I10 Essential (primary) hypertension: Secondary | ICD-10-CM | POA: Diagnosis not present

## 2014-06-13 DIAGNOSIS — F329 Major depressive disorder, single episode, unspecified: Secondary | ICD-10-CM | POA: Diagnosis not present

## 2014-06-13 DIAGNOSIS — K219 Gastro-esophageal reflux disease without esophagitis: Secondary | ICD-10-CM | POA: Diagnosis not present

## 2014-06-13 DIAGNOSIS — E785 Hyperlipidemia, unspecified: Secondary | ICD-10-CM | POA: Diagnosis not present

## 2014-06-13 DIAGNOSIS — M4808 Spinal stenosis, sacral and sacrococcygeal region: Secondary | ICD-10-CM | POA: Diagnosis not present

## 2014-06-13 DIAGNOSIS — M810 Age-related osteoporosis without current pathological fracture: Secondary | ICD-10-CM | POA: Diagnosis not present

## 2014-06-14 DIAGNOSIS — E039 Hypothyroidism, unspecified: Secondary | ICD-10-CM | POA: Diagnosis not present

## 2014-06-14 DIAGNOSIS — I2510 Atherosclerotic heart disease of native coronary artery without angina pectoris: Secondary | ICD-10-CM | POA: Diagnosis not present

## 2014-06-27 DIAGNOSIS — S42292A Other displaced fracture of upper end of left humerus, initial encounter for closed fracture: Secondary | ICD-10-CM | POA: Diagnosis not present

## 2014-07-12 DIAGNOSIS — F329 Major depressive disorder, single episode, unspecified: Secondary | ICD-10-CM | POA: Diagnosis not present

## 2014-07-12 DIAGNOSIS — M129 Arthropathy, unspecified: Secondary | ICD-10-CM | POA: Diagnosis not present

## 2014-07-12 DIAGNOSIS — K219 Gastro-esophageal reflux disease without esophagitis: Secondary | ICD-10-CM | POA: Diagnosis not present

## 2014-07-12 DIAGNOSIS — I2510 Atherosclerotic heart disease of native coronary artery without angina pectoris: Secondary | ICD-10-CM | POA: Diagnosis not present

## 2014-07-13 ENCOUNTER — Ambulatory Visit
Admit: 2014-07-13 | Disposition: A | Discharge status: Home / Self Care | Payer: Self-pay | Attending: Orthopedic Surgery | Admitting: Orthopedic Surgery

## 2014-07-13 DIAGNOSIS — Z09 Encounter for follow-up examination after completed treatment for conditions other than malignant neoplasm: Secondary | ICD-10-CM | POA: Diagnosis not present

## 2014-07-13 DIAGNOSIS — S42212K Unspecified displaced fracture of surgical neck of left humerus, subsequent encounter for fracture with nonunion: Secondary | ICD-10-CM | POA: Diagnosis not present

## 2014-07-13 DIAGNOSIS — S42292K Other displaced fracture of upper end of left humerus, subsequent encounter for fracture with nonunion: Secondary | ICD-10-CM | POA: Diagnosis not present

## 2014-07-16 DIAGNOSIS — S42292D Other displaced fracture of upper end of left humerus, subsequent encounter for fracture with routine healing: Secondary | ICD-10-CM | POA: Diagnosis not present

## 2014-07-17 DIAGNOSIS — R799 Abnormal finding of blood chemistry, unspecified: Secondary | ICD-10-CM | POA: Diagnosis not present

## 2014-07-17 DIAGNOSIS — R944 Abnormal results of kidney function studies: Secondary | ICD-10-CM | POA: Diagnosis not present

## 2014-07-17 DIAGNOSIS — E039 Hypothyroidism, unspecified: Secondary | ICD-10-CM | POA: Diagnosis not present

## 2014-08-06 ENCOUNTER — Other Ambulatory Visit: Payer: Self-pay | Admitting: Orthopedic Surgery

## 2014-08-09 DIAGNOSIS — N390 Urinary tract infection, site not specified: Secondary | ICD-10-CM | POA: Diagnosis not present

## 2014-08-09 DIAGNOSIS — M75102 Unspecified rotator cuff tear or rupture of left shoulder, not specified as traumatic: Secondary | ICD-10-CM | POA: Diagnosis not present

## 2014-08-09 DIAGNOSIS — D649 Anemia, unspecified: Secondary | ICD-10-CM | POA: Diagnosis not present

## 2014-08-09 DIAGNOSIS — M4808 Spinal stenosis, sacral and sacrococcygeal region: Secondary | ICD-10-CM | POA: Diagnosis not present

## 2014-08-09 DIAGNOSIS — K219 Gastro-esophageal reflux disease without esophagitis: Secondary | ICD-10-CM | POA: Diagnosis not present

## 2014-08-09 DIAGNOSIS — F329 Major depressive disorder, single episode, unspecified: Secondary | ICD-10-CM | POA: Diagnosis not present

## 2014-08-09 DIAGNOSIS — E039 Hypothyroidism, unspecified: Secondary | ICD-10-CM | POA: Diagnosis not present

## 2014-08-09 DIAGNOSIS — I10 Essential (primary) hypertension: Secondary | ICD-10-CM | POA: Diagnosis not present

## 2014-08-09 DIAGNOSIS — M810 Age-related osteoporosis without current pathological fracture: Secondary | ICD-10-CM | POA: Diagnosis not present

## 2014-08-09 DIAGNOSIS — E785 Hyperlipidemia, unspecified: Secondary | ICD-10-CM | POA: Diagnosis not present

## 2014-08-14 DIAGNOSIS — D649 Anemia, unspecified: Secondary | ICD-10-CM | POA: Diagnosis not present

## 2014-08-14 DIAGNOSIS — I10 Essential (primary) hypertension: Secondary | ICD-10-CM | POA: Diagnosis not present

## 2014-08-15 ENCOUNTER — Encounter (HOSPITAL_COMMUNITY): Payer: Self-pay

## 2014-08-15 ENCOUNTER — Ambulatory Visit (HOSPITAL_COMMUNITY)
Admission: RE | Admit: 2014-08-15 | Discharge: 2014-08-15 | Disposition: A | Discharge status: Home / Self Care | Payer: Commercial Managed Care - HMO | Source: Ambulatory Visit | Attending: Orthopedic Surgery | Admitting: Orthopedic Surgery

## 2014-08-15 ENCOUNTER — Other Ambulatory Visit: Payer: Self-pay

## 2014-08-15 ENCOUNTER — Encounter (HOSPITAL_COMMUNITY)
Admission: RE | Admit: 2014-08-15 | Discharge: 2014-08-15 | Disposition: A | Discharge status: Home / Self Care | Payer: Commercial Managed Care - HMO | Source: Ambulatory Visit | Attending: Orthopedic Surgery | Admitting: Orthopedic Surgery

## 2014-08-15 DIAGNOSIS — Z01818 Encounter for other preprocedural examination: Secondary | ICD-10-CM | POA: Insufficient documentation

## 2014-08-15 DIAGNOSIS — I10 Essential (primary) hypertension: Secondary | ICD-10-CM | POA: Diagnosis not present

## 2014-08-15 DIAGNOSIS — Z01812 Encounter for preprocedural laboratory examination: Secondary | ICD-10-CM | POA: Insufficient documentation

## 2014-08-15 DIAGNOSIS — J42 Unspecified chronic bronchitis: Secondary | ICD-10-CM | POA: Insufficient documentation

## 2014-08-15 DIAGNOSIS — Z01810 Encounter for preprocedural cardiovascular examination: Secondary | ICD-10-CM | POA: Insufficient documentation

## 2014-08-15 HISTORY — DX: Unspecified dementia, unspecified severity, without behavioral disturbance, psychotic disturbance, mood disturbance, and anxiety: F03.90

## 2014-08-15 HISTORY — DX: Constipation, unspecified: K59.00

## 2014-08-15 HISTORY — DX: Other seasonal allergic rhinitis: J30.2

## 2014-08-15 HISTORY — DX: Hyperlipidemia, unspecified: E78.5

## 2014-08-15 LAB — COMPREHENSIVE METABOLIC PANEL
ALBUMIN: 4.2 g/dL (ref 3.5–5.0)
ALK PHOS: 74 U/L (ref 38–126)
ALT: 13 U/L — ABNORMAL LOW (ref 14–54)
ANION GAP: 11 (ref 5–15)
AST: 20 U/L (ref 15–41)
BILIRUBIN TOTAL: 0.3 mg/dL (ref 0.3–1.2)
BUN: 12 mg/dL (ref 6–20)
CALCIUM: 9.4 mg/dL (ref 8.9–10.3)
CHLORIDE: 102 mmol/L (ref 101–111)
CO2: 24 mmol/L (ref 22–32)
CREATININE: 1.15 mg/dL — AB (ref 0.44–1.00)
GFR calc non Af Amer: 44 mL/min — ABNORMAL LOW (ref >60)
GFR, EST AFRICAN AMERICAN: 51 mL/min — AB (ref >60)
Glucose, Bld: 109 mg/dL — ABNORMAL HIGH (ref 65–99)
Potassium: 4 mmol/L (ref 3.5–5.1)
Sodium: 137 mmol/L (ref 135–145)
TOTAL PROTEIN: 7 g/dL (ref 6.5–8.1)

## 2014-08-15 LAB — URINALYSIS, ROUTINE W REFLEX MICROSCOPIC
BILIRUBIN URINE: NEGATIVE
GLUCOSE, UA: NEGATIVE mg/dL
HGB URINE DIPSTICK: NEGATIVE
KETONES UR: NEGATIVE mg/dL
Leukocytes, UA: NEGATIVE
Nitrite: NEGATIVE
PH: 7 (ref 5.0–8.0)
Protein, ur: NEGATIVE mg/dL
Specific Gravity, Urine: 1.021 (ref 1.005–1.030)
Urobilinogen, UA: 1 mg/dL (ref 0.0–1.0)

## 2014-08-15 LAB — CBC WITH DIFFERENTIAL/PLATELET
BASOS ABS: 0 10*3/uL (ref 0.0–0.1)
Basophils Relative: 0 % (ref 0–1)
EOS ABS: 0.1 10*3/uL (ref 0.0–0.7)
Eosinophils Relative: 1 % (ref 0–5)
HEMATOCRIT: 35.3 % — AB (ref 36.0–46.0)
Hemoglobin: 11.4 g/dL — ABNORMAL LOW (ref 12.0–15.0)
Lymphocytes Relative: 27 % (ref 12–46)
Lymphs Abs: 2.3 10*3/uL (ref 0.7–4.0)
MCH: 27.7 pg (ref 26.0–34.0)
MCHC: 32.3 g/dL (ref 30.0–36.0)
MCV: 85.7 fL (ref 78.0–100.0)
MONOS PCT: 6 % (ref 3–12)
Monocytes Absolute: 0.5 10*3/uL (ref 0.1–1.0)
NEUTROS PCT: 66 % (ref 43–77)
Neutro Abs: 5.6 10*3/uL (ref 1.7–7.7)
Platelets: 318 10*3/uL (ref 150–400)
RBC: 4.12 MIL/uL (ref 3.87–5.11)
RDW: 13.3 % (ref 11.5–15.5)
WBC: 8.5 10*3/uL (ref 4.0–10.5)

## 2014-08-15 LAB — APTT: aPTT: 29 seconds (ref 24–37)

## 2014-08-15 LAB — SURGICAL PCR SCREEN
MRSA, PCR: NEGATIVE
STAPHYLOCOCCUS AUREUS: NEGATIVE

## 2014-08-15 LAB — PROTIME-INR
INR: 1.03 (ref 0.00–1.49)
PROTHROMBIN TIME: 13.7 s (ref 11.6–15.2)

## 2014-08-15 NOTE — Pre-Procedure Instructions (Signed)
Karla Alexander  08/15/2014     [facility] - [place], [place] - [address] [place] [address] Phone: [phone] Fax: [phone]  [facility] [place], [place] - [address] [place] [address] Phone: [phone] Fax: [phone]    Your procedure is scheduled on: Tuesday August 23, 2014 at 10:25 AM.  Report to [facility] at 8:25 A.M.  Call this number if you have problems the morning of surgery: [phone]    Remember:  Do not eat food or drink liquids after midnight.  Take these medicines the morning of surgery with A SIP OF WATER: Acetaminophen (Tylenol), Albuterol inhaler if needed, Citalopram (Celexa), Clonazepam (Klonopin) if needed, Esomeprazole (Nexium), Flonase nasal spray, Hydrocodone if needed, Levothyroxine (Synthroid), Metoprolol (Lopressor)   Please stop taking any vitamins, herbal medications, Meloxicam, Ibuprofen, Advil, Motrin, Aleve, etc on Tuesday May 31st   Do not wear jewelry, make-up or nail polish.  Do not wear lotions, powders, or perfumes.  You may NOT wear deodorant.  Do not shave 48 hours prior to surgery.    Do not bring valuables to the hospital.  [facility] is not responsible for any belongings or valuables.  Contacts, dentures or bridgework may not be worn into surgery.  Leave your suitcase in the car.  After surgery it may be brought to your room.  For patients admitted to the hospital, discharge time will be determined by your treatment team.  Patients discharged the day of surgery will not be allowed to drive home.   Name and phone number of your driver:    Special instructions: Shower using CHG soap the night before and the morning of your surgery  Please read over the following fact sheets that you were given. Pain Booklet, Coughing and Deep Breathing, MRSA Information and Surgical Site Infection Prevention

## 2014-08-15 NOTE — Progress Notes (Signed)
Patient is alert and oriented and arrived to PAT via wheelchair with her niece Baker Janus. Patient informed Nurse that she currently resides at [facility].   PCP is [facility]. Patient denied having any acute cardiac or pulmonary issues.

## 2014-08-16 DIAGNOSIS — R944 Abnormal results of kidney function studies: Secondary | ICD-10-CM | POA: Diagnosis not present

## 2014-08-22 MED ORDER — CEFAZOLIN SODIUM-DEXTROSE 2-3 GM-% IV SOLR
2.0000 g | INTRAVENOUS | Status: AC
  Administered 2014-08-23: 2 g via INTRAVENOUS
  Filled 2014-08-22: qty 50

## 2014-08-23 ENCOUNTER — Encounter (HOSPITAL_COMMUNITY)
Admission: RE | Disposition: A | Discharge status: Skilled Nursing Facility | Payer: Self-pay | Source: Ambulatory Visit | Attending: Orthopedic Surgery

## 2014-08-23 ENCOUNTER — Inpatient Hospital Stay (HOSPITAL_COMMUNITY): Payer: Commercial Managed Care - HMO | Admitting: Anesthesiology

## 2014-08-23 ENCOUNTER — Inpatient Hospital Stay (HOSPITAL_COMMUNITY)
Admission: RE | Admit: 2014-08-23 | Discharge: 2014-08-27 | DRG: 483 | Disposition: A | Discharge status: Skilled Nursing Facility | Payer: Commercial Managed Care - HMO | Source: Ambulatory Visit | Attending: Orthopedic Surgery | Admitting: Orthopedic Surgery

## 2014-08-23 ENCOUNTER — Encounter (HOSPITAL_COMMUNITY): Payer: Self-pay | Admitting: General Practice

## 2014-08-23 ENCOUNTER — Inpatient Hospital Stay (HOSPITAL_COMMUNITY): Payer: Commercial Managed Care - HMO

## 2014-08-23 DIAGNOSIS — F0390 Unspecified dementia without behavioral disturbance: Secondary | ICD-10-CM | POA: Diagnosis present

## 2014-08-23 DIAGNOSIS — M21212 Flexion deformity, left shoulder: Secondary | ICD-10-CM | POA: Diagnosis not present

## 2014-08-23 DIAGNOSIS — D62 Acute posthemorrhagic anemia: Secondary | ICD-10-CM | POA: Diagnosis not present

## 2014-08-23 DIAGNOSIS — Z7951 Long term (current) use of inhaled steroids: Secondary | ICD-10-CM

## 2014-08-23 DIAGNOSIS — Z791 Long term (current) use of non-steroidal anti-inflammatories (NSAID): Secondary | ICD-10-CM | POA: Diagnosis not present

## 2014-08-23 DIAGNOSIS — I2510 Atherosclerotic heart disease of native coronary artery without angina pectoris: Secondary | ICD-10-CM | POA: Diagnosis present

## 2014-08-23 DIAGNOSIS — Z96612 Presence of left artificial shoulder joint: Secondary | ICD-10-CM | POA: Diagnosis not present

## 2014-08-23 DIAGNOSIS — K219 Gastro-esophageal reflux disease without esophagitis: Secondary | ICD-10-CM | POA: Diagnosis present

## 2014-08-23 DIAGNOSIS — W19XXXD Unspecified fall, subsequent encounter: Secondary | ICD-10-CM | POA: Diagnosis present

## 2014-08-23 DIAGNOSIS — E785 Hyperlipidemia, unspecified: Secondary | ICD-10-CM | POA: Diagnosis present

## 2014-08-23 DIAGNOSIS — Z7982 Long term (current) use of aspirin: Secondary | ICD-10-CM

## 2014-08-23 DIAGNOSIS — S42202K Unspecified fracture of upper end of left humerus, subsequent encounter for fracture with nonunion: Principal | ICD-10-CM

## 2014-08-23 DIAGNOSIS — F329 Major depressive disorder, single episode, unspecified: Secondary | ICD-10-CM | POA: Diagnosis present

## 2014-08-23 DIAGNOSIS — M1990 Unspecified osteoarthritis, unspecified site: Secondary | ICD-10-CM | POA: Diagnosis not present

## 2014-08-23 DIAGNOSIS — E039 Hypothyroidism, unspecified: Secondary | ICD-10-CM | POA: Diagnosis not present

## 2014-08-23 DIAGNOSIS — Z471 Aftercare following joint replacement surgery: Secondary | ICD-10-CM | POA: Diagnosis not present

## 2014-08-23 DIAGNOSIS — S42209A Unspecified fracture of upper end of unspecified humerus, initial encounter for closed fracture: Secondary | ICD-10-CM | POA: Diagnosis present

## 2014-08-23 DIAGNOSIS — Z7983 Long term (current) use of bisphosphonates: Secondary | ICD-10-CM | POA: Diagnosis not present

## 2014-08-23 DIAGNOSIS — Z9889 Other specified postprocedural states: Secondary | ICD-10-CM

## 2014-08-23 DIAGNOSIS — Z885 Allergy status to narcotic agent status: Secondary | ICD-10-CM | POA: Diagnosis not present

## 2014-08-23 DIAGNOSIS — Z419 Encounter for procedure for purposes other than remedying health state, unspecified: Secondary | ICD-10-CM

## 2014-08-23 DIAGNOSIS — I10 Essential (primary) hypertension: Secondary | ICD-10-CM | POA: Diagnosis present

## 2014-08-23 DIAGNOSIS — G8918 Other acute postprocedural pain: Secondary | ICD-10-CM | POA: Diagnosis not present

## 2014-08-23 HISTORY — PX: REVERSE SHOULDER ARTHROPLASTY: SHX5054

## 2014-08-23 SURGERY — ARTHROPLASTY, SHOULDER, TOTAL, REVERSE
Anesthesia: General | Site: Shoulder | Laterality: Left

## 2014-08-23 MED ORDER — DIPHENHYDRAMINE HCL 12.5 MG/5ML PO ELIX
12.5000 mg | ORAL_SOLUTION | ORAL | Status: DC | PRN
  Filled 2014-08-23: qty 10

## 2014-08-23 MED ORDER — ASPIRIN EC 325 MG PO TBEC
325.0000 mg | DELAYED_RELEASE_TABLET | Freq: Two times a day (BID) | ORAL | Status: DC
  Administered 2014-08-23 – 2014-08-27 (×8): 325 mg via ORAL
  Filled 2014-08-23 (×7): qty 1

## 2014-08-23 MED ORDER — MIDAZOLAM HCL 2 MG/2ML IJ SOLN
INTRAMUSCULAR | Status: AC
  Administered 2014-08-23: 0.5 mg
  Filled 2014-08-23: qty 2

## 2014-08-23 MED ORDER — LEVOTHYROXINE SODIUM 100 MCG PO TABS
100.0000 ug | ORAL_TABLET | Freq: Every day | ORAL | Status: DC
  Administered 2014-08-24 – 2014-08-27 (×4): 100 ug via ORAL
  Filled 2014-08-23 (×4): qty 1

## 2014-08-23 MED ORDER — MENTHOL 3 MG MT LOZG
1.0000 | LOZENGE | OROMUCOSAL | Status: DC | PRN
  Filled 2014-08-23: qty 9

## 2014-08-23 MED ORDER — MIDAZOLAM HCL 2 MG/2ML IJ SOLN
INTRAMUSCULAR | Status: AC
  Filled 2014-08-23: qty 2

## 2014-08-23 MED ORDER — KETOROLAC TROMETHAMINE 30 MG/ML IJ SOLN
30.0000 mg | Freq: Once | INTRAMUSCULAR | Status: AC | PRN
  Administered 2014-08-23: 30 mg via INTRAVENOUS

## 2014-08-23 MED ORDER — DEXAMETHASONE SODIUM PHOSPHATE 4 MG/ML IJ SOLN
INTRAMUSCULAR | Status: AC
  Filled 2014-08-23: qty 1

## 2014-08-23 MED ORDER — PHENOL 1.4 % MT LIQD: 1.0000 | OROMUCOSAL | Status: DC | PRN

## 2014-08-23 MED ORDER — PANTOPRAZOLE SODIUM 40 MG PO TBEC
40.0000 mg | DELAYED_RELEASE_TABLET | Freq: Every day | ORAL | Status: DC
  Administered 2014-08-24 – 2014-08-27 (×4): 40 mg via ORAL
  Filled 2014-08-23 (×4): qty 1

## 2014-08-23 MED ORDER — LACTATED RINGERS IV SOLN
INTRAVENOUS | Status: DC
  Administered 2014-08-23 (×3): via INTRAVENOUS

## 2014-08-23 MED ORDER — FENTANYL CITRATE (PF) 250 MCG/5ML IJ SOLN
INTRAMUSCULAR | Status: AC
  Filled 2014-08-23: qty 5

## 2014-08-23 MED ORDER — ONDANSETRON HCL 4 MG/2ML IJ SOLN
INTRAMUSCULAR | Status: AC
  Filled 2014-08-23: qty 2

## 2014-08-23 MED ORDER — ACETAMINOPHEN 500 MG PO TABS
1000.0000 mg | ORAL_TABLET | Freq: Four times a day (QID) | ORAL | Status: AC
  Administered 2014-08-23 – 2014-08-24 (×3): 1000 mg via ORAL
  Filled 2014-08-23 (×4): qty 2

## 2014-08-23 MED ORDER — ALUM & MAG HYDROXIDE-SIMETH 200-200-20 MG/5ML PO SUSP: 30.0000 mL | ORAL | Status: DC | PRN

## 2014-08-23 MED ORDER — PHENYLEPHRINE HCL 10 MG/ML IJ SOLN
10.0000 mg | INTRAVENOUS | Status: DC | PRN
  Administered 2014-08-23: 40 ug/min via INTRAVENOUS
  Administered 2014-08-23: 80 ug/min via INTRAVENOUS

## 2014-08-23 MED ORDER — SUCCINYLCHOLINE CHLORIDE 20 MG/ML IJ SOLN
INTRAMUSCULAR | Status: DC | PRN
Start: 2014-08-23 — End: 2014-08-23
  Administered 2014-08-23: 90 mg via INTRAVENOUS

## 2014-08-23 MED ORDER — ALENDRONATE SODIUM 70 MG PO TABS: 70.0000 mg | ORAL_TABLET | ORAL | Status: DC

## 2014-08-23 MED ORDER — DOCUSATE SODIUM 100 MG PO CAPS
100.0000 mg | ORAL_CAPSULE | Freq: Two times a day (BID) | ORAL | Status: DC
  Administered 2014-08-23 – 2014-08-27 (×8): 100 mg via ORAL
  Filled 2014-08-23 (×8): qty 1

## 2014-08-23 MED ORDER — MORPHINE SULFATE 2 MG/ML IJ SOLN: 1.0000 mg | INTRAMUSCULAR | Status: DC | PRN

## 2014-08-23 MED ORDER — POVIDONE-IODINE 7.5 % EX SOLN
Freq: Once | CUTANEOUS | Status: DC
  Filled 2014-08-23: qty 118

## 2014-08-23 MED ORDER — ONDANSETRON HCL 4 MG/2ML IJ SOLN: 4.0000 mg | Freq: Four times a day (QID) | INTRAMUSCULAR | Status: DC | PRN

## 2014-08-23 MED ORDER — ROPIVACAINE HCL 5 MG/ML IJ SOLN
INTRAMUSCULAR | Status: DC | PRN
  Administered 2014-08-23: 22 mL

## 2014-08-23 MED ORDER — CEFAZOLIN SODIUM 1-5 GM-% IV SOLN
1.0000 g | Freq: Four times a day (QID) | INTRAVENOUS | Status: AC
  Administered 2014-08-24 (×2): 1 g via INTRAVENOUS
  Filled 2014-08-23 (×3): qty 50

## 2014-08-23 MED ORDER — DEXTROSE 5 % IV SOLN
INTRAVENOUS | Status: DC | PRN
  Administered 2014-08-23: 11:00:00 via INTRAVENOUS

## 2014-08-23 MED ORDER — ALBUMIN HUMAN 5 % IV SOLN
INTRAVENOUS | Status: DC | PRN
  Administered 2014-08-23: 13:00:00 via INTRAVENOUS

## 2014-08-23 MED ORDER — FENTANYL CITRATE (PF) 100 MCG/2ML IJ SOLN
INTRAMUSCULAR | Status: AC
  Administered 2014-08-23: 25 ug
  Filled 2014-08-23: qty 2

## 2014-08-23 MED ORDER — FLEET ENEMA 7-19 GM/118ML RE ENEM: 1.0000 | ENEMA | Freq: Once | RECTAL | Status: AC | PRN

## 2014-08-23 MED ORDER — POTASSIUM CHLORIDE IN NACL 20-0.45 MEQ/L-% IV SOLN
INTRAVENOUS | Status: DC
  Administered 2014-08-23: 19:00:00 via INTRAVENOUS
  Filled 2014-08-23 (×12): qty 1000

## 2014-08-23 MED ORDER — SUCCINYLCHOLINE CHLORIDE 20 MG/ML IJ SOLN
INTRAMUSCULAR | Status: AC
  Filled 2014-08-23: qty 1

## 2014-08-23 MED ORDER — POLYETHYLENE GLYCOL 3350 17 G PO PACK
17.0000 g | PACK | Freq: Every day | ORAL | Status: DC | PRN
  Administered 2014-08-24 – 2014-08-27 (×3): 17 g via ORAL
  Filled 2014-08-23 (×3): qty 1

## 2014-08-23 MED ORDER — PROPOFOL 10 MG/ML IV BOLUS
INTRAVENOUS | Status: DC | PRN
  Administered 2014-08-23 (×3): 20 mg via INTRAVENOUS
  Administered 2014-08-23: 100 mg via INTRAVENOUS

## 2014-08-23 MED ORDER — METOPROLOL TARTRATE 25 MG PO TABS
25.0000 mg | ORAL_TABLET | Freq: Two times a day (BID) | ORAL | Status: DC
  Administered 2014-08-23 – 2014-08-27 (×6): 25 mg via ORAL
  Filled 2014-08-23 (×7): qty 1

## 2014-08-23 MED ORDER — SODIUM CHLORIDE 0.9 % IR SOLN
Status: DC | PRN
  Administered 2014-08-23: 3000 mL

## 2014-08-23 MED ORDER — ACETAMINOPHEN 650 MG RE SUPP: 650.0000 mg | Freq: Four times a day (QID) | RECTAL | Status: DC | PRN

## 2014-08-23 MED ORDER — ALBUTEROL SULFATE (2.5 MG/3ML) 0.083% IN NEBU: 3.0000 mL | INHALATION_SOLUTION | Freq: Four times a day (QID) | RESPIRATORY_TRACT | Status: DC | PRN

## 2014-08-23 MED ORDER — FENTANYL CITRATE (PF) 100 MCG/2ML IJ SOLN
INTRAMUSCULAR | Status: DC | PRN
  Administered 2014-08-23: 100 ug via INTRAVENOUS
  Administered 2014-08-23: 50 ug via INTRAVENOUS

## 2014-08-23 MED ORDER — HYDROMORPHONE HCL 1 MG/ML IJ SOLN: 0.2500 mg | INTRAMUSCULAR | Status: DC | PRN

## 2014-08-23 MED ORDER — CLONAZEPAM 0.5 MG PO TABS: 0.5000 mg | ORAL_TABLET | Freq: Two times a day (BID) | ORAL | Status: DC | PRN

## 2014-08-23 MED ORDER — KETOROLAC TROMETHAMINE 30 MG/ML IJ SOLN
INTRAMUSCULAR | Status: AC
  Filled 2014-08-23: qty 1

## 2014-08-23 MED ORDER — PROPOFOL 10 MG/ML IV BOLUS
INTRAVENOUS | Status: AC
  Filled 2014-08-23: qty 20

## 2014-08-23 MED ORDER — METOCLOPRAMIDE HCL 5 MG/ML IJ SOLN: 5.0000 mg | Freq: Three times a day (TID) | INTRAMUSCULAR | Status: DC | PRN

## 2014-08-23 MED ORDER — ONDANSETRON HCL 4 MG/2ML IJ SOLN
INTRAMUSCULAR | Status: DC | PRN
  Administered 2014-08-23: 4 mg via INTRAVENOUS

## 2014-08-23 MED ORDER — DEXAMETHASONE SODIUM PHOSPHATE 4 MG/ML IJ SOLN
INTRAMUSCULAR | Status: DC | PRN
  Administered 2014-08-23: 4 mg via INTRAVENOUS

## 2014-08-23 MED ORDER — MOMETASONE FURO-FORMOTEROL FUM 100-5 MCG/ACT IN AERO
2.0000 | INHALATION_SPRAY | Freq: Two times a day (BID) | RESPIRATORY_TRACT | Status: DC
  Administered 2014-08-24 – 2014-08-26 (×5): 2 via RESPIRATORY_TRACT
  Filled 2014-08-23 (×3): qty 8.8

## 2014-08-23 MED ORDER — PROMETHAZINE HCL 25 MG/ML IJ SOLN: 6.2500 mg | INTRAMUSCULAR | Status: DC | PRN

## 2014-08-23 MED ORDER — TRIAMTERENE-HCTZ 37.5-25 MG PO CAPS
1.0000 | ORAL_CAPSULE | Freq: Every day | ORAL | Status: DC
  Administered 2014-08-26 – 2014-08-27 (×2): 1 via ORAL
  Filled 2014-08-23 (×4): qty 1

## 2014-08-23 MED ORDER — OXYCODONE HCL 5 MG PO TABS
5.0000 mg | ORAL_TABLET | ORAL | Status: DC | PRN
  Administered 2014-08-23 – 2014-08-24 (×2): 10 mg via ORAL
  Administered 2014-08-24: 5 mg via ORAL
  Administered 2014-08-24 – 2014-08-27 (×8): 10 mg via ORAL
  Filled 2014-08-23 (×3): qty 2
  Filled 2014-08-23: qty 1
  Filled 2014-08-23 (×8): qty 2

## 2014-08-23 MED ORDER — ACETAMINOPHEN 325 MG PO TABS: 650.0000 mg | ORAL_TABLET | Freq: Four times a day (QID) | ORAL | Status: DC | PRN

## 2014-08-23 MED ORDER — METOCLOPRAMIDE HCL 5 MG PO TABS: 5.0000 mg | ORAL_TABLET | Freq: Three times a day (TID) | ORAL | Status: DC | PRN

## 2014-08-23 MED ORDER — BISACODYL 5 MG PO TBEC: 5.0000 mg | DELAYED_RELEASE_TABLET | Freq: Every day | ORAL | Status: DC | PRN

## 2014-08-23 MED ORDER — LIDOCAINE HCL (CARDIAC) 20 MG/ML IV SOLN
INTRAVENOUS | Status: AC
  Filled 2014-08-23: qty 5

## 2014-08-23 MED ORDER — LIDOCAINE HCL (CARDIAC) 20 MG/ML IV SOLN
INTRAVENOUS | Status: DC | PRN
  Administered 2014-08-23: 70 mg via INTRAVENOUS

## 2014-08-23 MED ORDER — METOPROLOL TARTRATE 50 MG PO TABS
50.0000 mg | ORAL_TABLET | Freq: Once | ORAL | Status: AC
  Administered 2014-08-23: 50 mg via ORAL

## 2014-08-23 MED ORDER — ONDANSETRON HCL 4 MG PO TABS: 4.0000 mg | ORAL_TABLET | Freq: Four times a day (QID) | ORAL | Status: DC | PRN

## 2014-08-23 MED ORDER — CITALOPRAM HYDROBROMIDE 20 MG PO TABS
20.0000 mg | ORAL_TABLET | Freq: Every day | ORAL | Status: DC
  Administered 2014-08-23 – 2014-08-27 (×5): 20 mg via ORAL
  Filled 2014-08-23 (×5): qty 1

## 2014-08-23 MED ORDER — PRAVASTATIN SODIUM 20 MG PO TABS
20.0000 mg | ORAL_TABLET | Freq: Every day | ORAL | Status: DC
  Administered 2014-08-23 – 2014-08-26 (×4): 20 mg via ORAL
  Filled 2014-08-23 (×4): qty 1

## 2014-08-23 MED ORDER — METOPROLOL TARTRATE 50 MG PO TABS
ORAL_TABLET | ORAL | Status: AC
  Filled 2014-08-23: qty 1

## 2014-08-23 SURGICAL SUPPLY — 74 items
BLADE SAW SAG 73X25 THK (BLADE) ×2
BLADE SAW SGTL 73X25 THK (BLADE) ×1 IMPLANT
BLADE SURG 15 STRL LF DISP TIS (BLADE) ×1 IMPLANT
BLADE SURG 15 STRL SS (BLADE) ×2
BOWL SMART MIX CTS (DISPOSABLE) IMPLANT
BUR EGG ELITE 4.0 (BURR) ×2 IMPLANT
BUR EGG ELITE 4.0MM (BURR) ×1
CAPT SHLDR REVTOTAL 2 ALTIVATE ×3 IMPLANT
CEMENT HV SMART SET (Cement) ×6 IMPLANT
CHLORAPREP W/TINT 26ML (MISCELLANEOUS) ×3 IMPLANT
CLOSURE WOUND 1/2 X4 (GAUZE/BANDAGES/DRESSINGS) ×1
COVER MAYO STAND STRL (DRAPES) IMPLANT
COVER SURGICAL LIGHT HANDLE (MISCELLANEOUS) ×3 IMPLANT
DRAPE C-ARM 42X72 X-RAY (DRAPES) ×3 IMPLANT
DRAPE INCISE IOBAN 66X45 STRL (DRAPES) ×6 IMPLANT
DRAPE ORTHO SPLIT 77X108 STRL (DRAPES) ×4
DRAPE SURG 17X23 STRL (DRAPES) ×3 IMPLANT
DRAPE SURG ORHT 6 SPLT 77X108 (DRAPES) ×2 IMPLANT
DRAPE U-SHAPE 47X51 STRL (DRAPES) ×3 IMPLANT
DRILL BIT 5/64 (BIT) ×3 IMPLANT
DRSG AQUACEL AG ADV 3.5X10 (GAUZE/BANDAGES/DRESSINGS) ×3 IMPLANT
ELECT BLADE 4.0 EZ CLEAN MEGAD (MISCELLANEOUS) ×3
ELECT REM PT RETURN 9FT ADLT (ELECTROSURGICAL) ×3
ELECTRODE BLDE 4.0 EZ CLN MEGD (MISCELLANEOUS) ×1 IMPLANT
ELECTRODE REM PT RTRN 9FT ADLT (ELECTROSURGICAL) ×1 IMPLANT
EVACUATOR 1/8 PVC DRAIN (DRAIN) IMPLANT
GLOVE BIO SURGEON STRL SZ7 (GLOVE) ×3 IMPLANT
GLOVE BIO SURGEON STRL SZ7.5 (GLOVE) ×3 IMPLANT
GLOVE BIOGEL PI IND STRL 7.0 (GLOVE) ×1 IMPLANT
GLOVE BIOGEL PI IND STRL 8 (GLOVE) ×1 IMPLANT
GLOVE BIOGEL PI INDICATOR 7.0 (GLOVE) ×2
GLOVE BIOGEL PI INDICATOR 8 (GLOVE) ×2
GOWN STRL REUS W/ TWL LRG LVL3 (GOWN DISPOSABLE) ×3 IMPLANT
GOWN STRL REUS W/ TWL XL LVL3 (GOWN DISPOSABLE) ×1 IMPLANT
GOWN STRL REUS W/TWL LRG LVL3 (GOWN DISPOSABLE) ×6
GOWN STRL REUS W/TWL XL LVL3 (GOWN DISPOSABLE) ×2
HANDPIECE INTERPULSE COAX TIP (DISPOSABLE) ×2
HOOD PEEL AWAY FACE SHEILD DIS (HOOD) ×6 IMPLANT
KIT BASIN OR (CUSTOM PROCEDURE TRAY) ×3 IMPLANT
KIT ROOM TURNOVER OR (KITS) ×3 IMPLANT
MANIFOLD NEPTUNE II (INSTRUMENTS) ×3 IMPLANT
NEEDLE HYPO 25GX1X1/2 BEV (NEEDLE) IMPLANT
NEEDLE MAYO TROCAR (NEEDLE) IMPLANT
NOZZLE PRISM 8.5MM (MISCELLANEOUS) IMPLANT
NS IRRIG 1000ML POUR BTL (IV SOLUTION) ×3 IMPLANT
PACK SHOULDER (CUSTOM PROCEDURE TRAY) ×3 IMPLANT
PAD ARMBOARD 7.5X6 YLW CONV (MISCELLANEOUS) ×6 IMPLANT
RETRIEVER SUT HEWSON (MISCELLANEOUS) ×3 IMPLANT
SET HNDPC FAN SPRY TIP SCT (DISPOSABLE) ×1 IMPLANT
SLEEVE CABLE 1.6MM (Orthopedic Implant) ×6 IMPLANT
SLING ARM IMMOBILIZER LRG (SOFTGOODS) ×3 IMPLANT
SLING ARM IMMOBILIZER MED (SOFTGOODS) IMPLANT
SPONGE LAP 18X18 X RAY DECT (DISPOSABLE) ×3 IMPLANT
SPONGE LAP 4X18 X RAY DECT (DISPOSABLE) IMPLANT
STRIP CLOSURE SKIN 1/2X4 (GAUZE/BANDAGES/DRESSINGS) ×2 IMPLANT
SUCTION FRAZIER TIP 10 FR DISP (SUCTIONS) ×6 IMPLANT
SUPPORT WRAP ARM LG (MISCELLANEOUS) ×3 IMPLANT
SUT ETHIBOND NAB CT1 #1 30IN (SUTURE) IMPLANT
SUT FIBERWIRE #2 38 T-5 BLUE (SUTURE)
SUT MNCRL AB 4-0 PS2 18 (SUTURE) ×3 IMPLANT
SUT SILK 2 0 TIES 17X18 (SUTURE)
SUT SILK 2-0 18XBRD TIE BLK (SUTURE) IMPLANT
SUT VIC AB 0 CTB1 27 (SUTURE) ×3 IMPLANT
SUT VIC AB 2-0 CT1 27 (SUTURE) ×2
SUT VIC AB 2-0 CT1 TAPERPNT 27 (SUTURE) ×1 IMPLANT
SUTURE FIBERWR #2 38 T-5 BLUE (SUTURE) IMPLANT
SYR CONTROL 10ML LL (SYRINGE) IMPLANT
SYRINGE TOOMEY DISP (SYRINGE) IMPLANT
SYS SHLDR CAPITATED REV 2 ×1 IMPLANT
TAPE FIBER 2MM 7IN #2 BLUE (SUTURE) IMPLANT
TOWEL OR 17X24 6PK STRL BLUE (TOWEL DISPOSABLE) ×3 IMPLANT
TOWEL OR 17X26 10 PK STRL BLUE (TOWEL DISPOSABLE) ×3 IMPLANT
WATER STERILE IRR 1000ML POUR (IV SOLUTION) ×3 IMPLANT
YANKAUER SUCT BULB TIP NO VENT (SUCTIONS) IMPLANT

## 2014-08-23 NOTE — Op Note (Signed)
Procedure(s): REVERSE SHOULDER ARTHROPLASTY WITH APPLICATION OF CABLES  Procedure Note  [place] B Keep female 81 y.o. 08/23/2014  Procedure(s) and Anesthesia Type:    * LEFT REVERSE SHOULDER ARTHROPLASTY WITH APPLICATION OF CABLES  - Choice   Indications:  81 y.o. female  With left proximal humerus comminuted nonunion which was chronically painful and dysfunctional. After long discussion of risks benefits alternatives to surgery she wished to go forward with surgery to try and decrease pain and increase function. She understood risks benefits alternatives to the procedure. She also was a ward of the state in her social worker was contacted regarding her surgery and gave consent the behalf of the state.  Surgeon: Nita Sells   Assistants: Jeanmarie Hubert PA-C [facility] was present and scrubbed throughout the procedure and was essential in positioning, retraction, exposure, and closure)  Anesthesia: General endotracheal anesthesia with preoperative interscalene block given by the attending anesthesiologist     Procedure Detail  [place]    Estimated Blood Loss:  736mL         Drains: none  Blood Given: none          Specimens: none        Complications:  * No complications entered in OR log *         Disposition: PACU - hemodynamically stable.         Condition: stable      OPERATIVE FINDINGS:  A DJO altivate long stem was cemented with a size 32-4 glenoid head 8 x 1 75 cemented humeral stem 8 mm spacer and standard 32 insert. The greater tuberosity fragment which extended down into diaphyseal spike was affixed around the implant with sutures proximally and cables distally. Excellent stability was achieved.  PROCEDURE: The patient was identified in the preoperative holding area  where I personally marked the operative site after verifying site, side,  and procedure with the patient. An interscalene block  given by  the attending anesthesiologist in the holding area and the patient was taken back to the operating room where all extremities were  carefully padded in position after general anesthesia was induced. She  was placed in a beach-chair position and the operative upper extremity was  prepped and draped in a standard sterile fashion. An approximately 15-  cm incision was made from the tip of the coracoid process to the center  point of the humerus at the level of the axilla. Dissection was carried  down through subcutaneous tissues to the level of the cephalic vein  which was taken laterally with the deltoid. The pectoralis major was  retracted medially.  She had a large amount of scar tissue which required careful dissection. The biceps tendon was identified proximally and traced into the joint identifying the rotator interval. Lesser tuberosity was taken off with osteotomy and the joint was exposed. The large cortical fragment extending distally was carefully exposed with great effort to try and leave the deltoid attachment to this fragment intact. Once adequately mobilized a large osteotome was used to remove the humeral head leading the greater tuberosity intact with a large cortical spike. At this point the glenoid was exposed and the labrum was excised. The guide was used to place the central tap and subsequently the standard base plate with a 30 mm central screw with excellent compression and fixation. The peripheral screws were then placed with the exception of the anterior screw which was left open secondary to inadequate bone. At this  point a 32-4 head was placed as a trial. The proximal shaft of the humerus was then exposed with careful dissection. The proximal and was exposed circumferentially taking great care to stay directly on bone new avoid any injury to the radial nerve in this region. This allowed placement of Dall-Miles cables around the proximal shaft in preparation to fix the  large cortical spike. The bur was used to resect some of the inner portion of the greater tuberosity to allow the implant to sit appropriately. 2 #2 fiber wires were then placed around the bone tendon junction of the greater tuberosity fragment. After reaming intramedullary canal and trialing #8 implant the decision was made using a long stem which was cemented into place with about 20 of retroversion at the appropriate estimated height. Once the cement was hardened the tuberosity and distal cortical fragment were fixed and a place with sutures and cables. The joint was then reduced and trialed. The 8 mm monoblock spacer with a standard poly-was felt to be the best stability and soft tissue tension. This afforded a 2 finger reduction with no tendency towards instability. Therefore the final monoblock spacer and poly-was placed. The joint was reduced and taken through range of motion and felt to be stable. X-rays were taken showing good positioning of the tuberosity fragment and positioning of the implants. The lesser tuberosity fragment was unable to be adequately reduced given the tension on the subscapularis and therefore was resected. Copious irrigation was used and the wound was then closed in layers with 2-0 vicryl  In a deep dermal layer and staples for skin closure. A light sterile dressing was applied and the patient was allowed to awaken from anesthesia transferred to the stretcher and taken to recovery room in stable condition.   POST OPERATIVE PLAN: The patient will be kept in the hospital postoperatively for pain control, antibiotics and therapy.

## 2014-08-23 NOTE — Anesthesia Preprocedure Evaluation (Addendum)
Anesthesia Evaluation  Patient identified by MRN, date of birth, ID band Patient awake    Reviewed: Allergy & Precautions, NPO status , Patient's Chart, lab work & pertinent test results  History of Anesthesia Complications (+) PONV  Airway Mallampati: II  TM Distance: >3 FB Neck ROM: Full    Dental no notable dental hx. (+) Edentulous Upper, Edentulous Lower, Dental Advisory Given   Pulmonary neg pulmonary ROS,  breath sounds clear to auscultation  Pulmonary exam normal       Cardiovascular hypertension, Pt. on medications and Pt. on home beta blockers Normal cardiovascular examRhythm:Regular Rate:Normal     Neuro/Psych negative neurological ROS  negative psych ROS   GI/Hepatic Neg liver ROS, GERD-  Medicated,  Endo/Other  Hypothyroidism   Renal/GU negative Renal ROS  negative genitourinary   Musculoskeletal negative musculoskeletal ROS (+)   Abdominal   Peds negative pediatric ROS (+)  Hematology negative hematology ROS (+)   Anesthesia Other Findings   Reproductive/Obstetrics negative OB ROS                            Anesthesia Physical Anesthesia Plan  ASA: III  Anesthesia Plan: General   Post-op Pain Management:    Induction: Intravenous  Airway Management Planned: Oral ETT  Additional Equipment:   Intra-op Plan:   Post-operative Plan: Extubation in OR  Informed Consent: I have reviewed the patients History and Physical, chart, labs and discussed the procedure including the risks, benefits and alternatives for the proposed anesthesia with the patient or authorized representative who has indicated his/her understanding and acceptance.   Dental advisory given  Plan Discussed with: CRNA and Surgeon  Anesthesia Plan Comments:         Anesthesia Quick Evaluation

## 2014-08-23 NOTE — Transfer of Care (Signed)
Immediate Anesthesia Transfer of Care Note  Patient: Karla Alexander  Procedure(s) Performed: Procedure(s) with comments: REVERSE SHOULDER ARTHROPLASTY WITH APPLICATION OF CABLES  (Left) - Left reverse total shoulder arthroplasty  Patient Location: PACU  Anesthesia Type:General  Level of Consciousness: awake, alert  and patient cooperative  Airway & Oxygen Therapy: Patient Spontanous Breathing and Patient connected to face mask oxygen  Post-op Assessment: Report given to RN, Post -op Vital signs reviewed and stable and Patient moving all extremities  Post vital signs: Reviewed and stable  Last Vitals:  Filed Vitals:   08/23/14 1025  BP: 166/60  Pulse: 75  Temp:   Resp: 17    Complications: No apparent anesthesia complications

## 2014-08-23 NOTE — Anesthesia Procedure Notes (Addendum)
Anesthesia Regional Block:  Interscalene brachial plexus block  Pre-Anesthetic Checklist: ,, timeout performed, Correct Patient, Correct Site, Correct Laterality, Correct Procedure, Correct Position, site marked, Risks and benefits discussed,  Surgical consent,  Pre-op evaluation,  At surgeon's request and post-op pain management  Laterality: Upper and Left  Prep: chloraprep and alcohol swabs       Needles:  Injection technique: Single-shot  Needle Type: Stimulator Needle - 40     Needle Length: 4cm 4 cm Needle Gauge: 22 and 22 G  Needle insertion depth: 3 cm   Additional Needles:  Procedures: ultrasound guided (picture in chart) and nerve stimulator Interscalene brachial plexus block  Nerve Stimulator or Paresthesia:  Response: Twitch elicited, 0.5 mA, 0.3 ms,   Additional Responses:   Narrative:  Start time: 08/23/2014 9:50 AM End time: 08/23/2014 10:10 AM Injection made incrementally with aspirations every 5 mL.  Performed by: Personally  Anesthesiologist: MASSAGEE, TERRY  Additional Notes: Block assessed prior to start of surgery   Procedure Name: Intubation Date/Time: 08/23/2014 11:04 AM Performed by: Williemae Area B Pre-anesthesia Checklist: Emergency Drugs available, Patient identified, Patient being monitored and Suction available Patient Re-evaluated:Patient Re-evaluated prior to inductionOxygen Delivery Method: Circle system utilized Preoxygenation: Pre-oxygenation with 100% oxygen Intubation Type: IV induction and Cricoid Pressure applied Laryngoscope Size: Mac, 3 and Glidescope Grade View: Grade II Tube type: Oral Tube size: 7.0 mm Number of attempts: 2 Airway Equipment and Method: Video-laryngoscopy and Stylet (maske airway not tested due to rapid sequence induction) Placement Confirmation: ETT inserted through vocal cords under direct vision,  breath sounds checked- equal and bilateral and positive ETCO2 (golden liquid pooling in posterior pharynx  immediately sx'd upon laryngoscopy.) Secured at: 21 (cm at gums) cm Tube secured with: Tape Dental Injury: Teeth and Oropharynx as per pre-operative assessment  Comments: Upon laryngoscopy with Glidescope, pool of golden liquid noted at back of pharynx, which was immediately suctioned with [place].  [place] sx tip left in posterior pharynx for duration of laryngoscopy and intubation.

## 2014-08-23 NOTE — H&P (Signed)
Karla Alexander is an 81 y.o. female.   Chief Complaint: L shoulder pain and dysfunction HPI: L shoulder comminuted proximal humerus nonunion s/p fall 7 months ago  Past Medical History  Diagnosis Date  . Depression   . GERD (gastroesophageal reflux disease)   . Hypothyroidism   . Shortness of breath   . Arthritis   . Hypertension     dr Delsa Sale   [place]  . Coronary artery disease     non-obstructive CAD 04/2013  . Constipation   . Seasonal allergies   . Hyperlipemia   . Dementia     Past Surgical History  Procedure Laterality Date  . No past surgeries    . Cardiac catheterization  04/24/2013    20% mLAD, 20% OM1, 30% #1/60% #2 pRCA [facility]).  EF 68% by stress test 04/12/13.   . Maximum access (mas)posterior lumbar interbody fusion (plif) 2 level N/A 05/31/2013    Procedure: FOR MAXIMUM ACCESS (MAS) POSTERIOR LUMBAR INTERBODY FUSION (PLIF) Lumbar Four-Five, Lumbar Five-Sacral One;  Surgeon: Eustace Moore, MD;  Location: [facility];  Service: Neurosurgery;  Laterality: N/A;  FOR MAXIMUM ACCESS (MAS) POSTERIOR LUMBAR INTERBODY FUSION (PLIF) Lumbar Four-Five, Lumbar Five-Sacral One  . Back surgery      History reviewed. No pertinent family history. Social History:  reports that Karla Alexander has never smoked. Karla Alexander does not have any smokeless tobacco history on file. Karla Alexander reports that Karla Alexander does not drink alcohol or use illicit drugs.  Allergies:  Allergies  Allergen Reactions  . Codeine     Other reaction(s): Hallucination    Medications Prior to Admission  Medication Sig Dispense Refill  . acetaminophen (TYLENOL) 325 MG tablet Take 650 mg by mouth 3 (three) times daily.    Marland Kitchen alendronate (FOSAMAX) 70 MG tablet Take 70 mg by mouth once a week. Take with a full glass of water on an empty stomach.    Marland Kitchen aspirin 325 MG tablet Take 325 mg by mouth daily.    . Cholecalciferol 1000 UNITS capsule Take 1,000 Units by mouth daily.    . citalopram (CELEXA) 10 MG tablet Take 20 mg by mouth daily.     . clonazePAM (KLONOPIN) 0.5 MG tablet Take 0.5 mg by mouth 2 (two) times daily as needed for anxiety.     . docusate sodium (COLACE) 100 MG capsule Take 100 mg by mouth daily as needed for mild constipation.    Marland Kitchen esomeprazole (NEXIUM) 20 MG capsule Take 20 mg by mouth daily at 12 noon.    . fluticasone (FLONASE) 50 MCG/ACT nasal spray Place 1 spray into both nostrils daily.    . Fluticasone-Salmeterol (ADVAIR) 100-50 MCG/DOSE AEPB Inhale 1 puff into the lungs 2 (two) times daily.    Marland Kitchen guaiFENesin (ROBITUSSIN) 100 MG/5ML SOLN Take 20 mLs by mouth every 4 (four) hours as needed for cough or to loosen phlegm.    Marland Kitchen HYDROcodone-acetaminophen (NORCO/VICODIN) 5-325 MG per tablet Take 1-2 tablets by mouth every 6 (six) hours as needed for moderate pain. 90 tablet 0  . levothyroxine (SYNTHROID, LEVOTHROID) 100 MCG tablet Take 100 mcg by mouth daily before breakfast.    . lovastatin (MEVACOR) 20 MG tablet Take 20 mg by mouth.    . meloxicam (MOBIC) 15 MG tablet Take 15 mg by mouth daily.     . metoprolol (LOPRESSOR) 50 MG tablet Take 25 mg by mouth 2 (two) times daily.    Marland Kitchen triamterene-hydrochlorothiazide (DYAZIDE) 37.5-25 MG per capsule Take 1 capsule by  mouth daily.    Marland Kitchen albuterol (PROVENTIL HFA;VENTOLIN HFA) 108 (90 BASE) MCG/ACT inhaler Inhale 1 puff into the lungs every 6 (six) hours as needed for wheezing or shortness of breath.    Marland Kitchen alum & mag hydroxide-simeth (MAALOX/MYLANTA) 200-200-20 MG/5ML suspension Take 30 mLs by mouth every 4 (four) hours as needed for indigestion or heartburn.      No results found for this or any previous visit (from the past 48 hour(s)). No results found.  Review of Systems  Unable to perform ROS: dementia    Blood pressure 140/75, pulse 75, temperature 97.2 F (36.2 C), temperature source Oral, resp. rate 24, height 5' (1.524 m), weight 74.707 kg (164 lb 11.2 oz), SpO2 100 %. Physical Exam  Constitutional: Karla Alexander is oriented to person, place, and time. Karla Alexander appears  well-developed and well-nourished.  HENT:  Head: Atraumatic.  Eyes: EOM are normal.  Cardiovascular: Intact distal pulses.   Respiratory: Effort normal.  Musculoskeletal:  L shoulder pain with limited motion  Neurological: Karla Alexander is alert and oriented to person, place, and time.  Skin: Skin is warm and dry.  Psychiatric: Karla Alexander has a normal mood and affect.     Assessment/Plan L shoulder comminuted displaced proximal humerus nonunion Plan L shoulder reverse TSA Risks / benefits of surgery discussed Consent on chart  NPO for OR Preop antibiotics   Garris Melhorn WILLIAM 08/23/2014, 10:03 AM

## 2014-08-23 NOTE — Anesthesia Postprocedure Evaluation (Signed)
  Anesthesia Post-op Note  Patient: Karla Alexander  Procedure(s) Performed: Procedure(s) with comments: REVERSE SHOULDER ARTHROPLASTY WITH APPLICATION OF CABLES  (Left) - Left reverse total shoulder arthroplasty  Patient Location: PACU  Anesthesia Type:General and GA combined with regional for post-op pain  Level of Consciousness: awake, oriented, sedated and patient cooperative  Airway and Oxygen Therapy: Patient Spontanous Breathing  Post-op Pain: none  Post-op Assessment: Post-op Vital signs reviewed, Patient's Cardiovascular Status Stable, Respiratory Function Stable, Patent Airway, No signs of Nausea or vomiting and Pain level controlled  Post-op Vital Signs: stable  Last Vitals:  Filed Vitals:   08/23/14 1530  BP: 100/66  Pulse: 79  Temp:   Resp: 21    Complications: No apparent anesthesia complications

## 2014-08-23 NOTE — Progress Notes (Signed)
PHARMACIST - PHYSICIAN COMMUNICATION  CONCERNING: P&T Medication Policy Regarding Oral Bisphosphonates  RECOMMENDATION: Your order for alendronate (Fosamax) has been discontinued at this time.  If the patient's post-hospital medical condition warrants safe use of this class of drugs, please resume the pre-hospital regimen upon discharge.  DESCRIPTION:  Alendronate (Fosamax), ibandronate (Boniva), and risedronate (Actonel) can cause severe esophageal erosions in patients who are unable to remain upright at least 30 minutes after taking this medication.   Since brief interruptions in therapy are thought to have minimal impact on bone mineral density, the [facility] has established that bisphosphonate orders should be routinely discontinued during hospitalization.   To override this safety policy and permit administration of Boniva, Fosamax, or Actonel in the hospital, prescribers must write "DO NOT HOLD" in the comments section when placing the order for this class of medications.  Kelvin Cellar, RPh Pager: [phone] 08/23/2014 6:11 PM

## 2014-08-24 ENCOUNTER — Encounter (HOSPITAL_COMMUNITY): Payer: Self-pay | Admitting: Orthopedic Surgery

## 2014-08-24 LAB — BASIC METABOLIC PANEL
Anion gap: 8 (ref 5–15)
BUN: 20 mg/dL (ref 6–20)
CHLORIDE: 102 mmol/L (ref 101–111)
CO2: 24 mmol/L (ref 22–32)
CREATININE: 1.23 mg/dL — AB (ref 0.44–1.00)
Calcium: 8.1 mg/dL — ABNORMAL LOW (ref 8.9–10.3)
GFR calc non Af Amer: 40 mL/min — ABNORMAL LOW (ref >60)
GFR, EST AFRICAN AMERICAN: 47 mL/min — AB (ref >60)
GLUCOSE: 133 mg/dL — AB (ref 65–99)
Potassium: 3.9 mmol/L (ref 3.5–5.1)
Sodium: 134 mmol/L — ABNORMAL LOW (ref 135–145)

## 2014-08-24 LAB — PREPARE RBC (CROSSMATCH)

## 2014-08-24 LAB — CBC
HEMATOCRIT: 22.8 % — AB (ref 36.0–46.0)
Hemoglobin: 7.3 g/dL — ABNORMAL LOW (ref 12.0–15.0)
MCH: 27.3 pg (ref 26.0–34.0)
MCHC: 32 g/dL (ref 30.0–36.0)
MCV: 85.4 fL (ref 78.0–100.0)
Platelets: 223 10*3/uL (ref 150–400)
RBC: 2.67 MIL/uL — ABNORMAL LOW (ref 3.87–5.11)
RDW: 13.5 % (ref 11.5–15.5)
WBC: 10.2 10*3/uL (ref 4.0–10.5)

## 2014-08-24 LAB — HEMOGLOBIN AND HEMATOCRIT, BLOOD
HEMATOCRIT: 26.5 % — AB (ref 36.0–46.0)
Hemoglobin: 8.7 g/dL — ABNORMAL LOW (ref 12.0–15.0)

## 2014-08-24 MED ORDER — OXYCODONE-ACETAMINOPHEN 5-325 MG PO TABS: 1.0000 | ORAL_TABLET | ORAL | Status: DC | PRN

## 2014-08-24 MED ORDER — WHITE PETROLATUM GEL
Status: AC
  Administered 2014-08-24: 09:00:00
  Filled 2014-08-24: qty 1

## 2014-08-24 MED ORDER — SODIUM CHLORIDE 0.9 % IV SOLN
Freq: Once | INTRAVENOUS | Status: AC
  Administered 2014-08-24: 13:00:00 via INTRAVENOUS

## 2014-08-24 NOTE — Progress Notes (Signed)
   PATIENT ID: Karla Alexander   1 Day Post-Op Procedure(s) (LRB): REVERSE SHOULDER ARTHROPLASTY WITH APPLICATION OF CABLES  (Left)  Subjective: Doing well, pain under control, block has worn off. Wishes to return to [facility], which has a skilled/advanced care facility.   Objective:  Filed Vitals:   08/24/14 0610  BP: 125/76  Pulse: 91  Temp: 98.6 F (37 C)  Resp: 16     Left UE dressing c/d/i Repositioned sling Wiggles fingers, distally NVI  Labs:   Recent Labs  08/24/14 0625  HGB 7.3*   Recent Labs  08/24/14 0625  WBC 10.2  RBC 2.67*  HCT 22.8*  PLT 223   Recent Labs  08/24/14 0625  NA 134*  K 3.9  CL 102  CO2 24  BUN 20  CREATININE 1.23*  GLUCOSE 133*  CALCIUM 8.1*    Assessment and Plan: 1 day s/p REVERSE SHOULDER ARTHROPLASTY WITH APPLICATION OF CABLES  (Left) ABLA- expected po, will transfuse 1 unit packed RBC today Sling, hand, wrist elbow ROM only Will consult OT/PT social work to work on getting her back to [facility] with possible relocation to their skilled nursing of applicable D/c today if possible   VTE proph: ASA 325mg  BID, SCDs

## 2014-08-24 NOTE — Care Management Note (Signed)
Case Management Note  Patient Details  Name: Karla Alexander MRN: [id number] Date of Birth: December 13, 1932  Subjective/Objective:   81 yr old female admitted with a left proximal humerus fracture, patient underwent a left reverse shoulder arthroplasty.   Action/Plan:   Patient will need shortterm rehab at SNF, social worker is aware.       Expected Discharge Date: 08/24/14                 Expected Discharge Plan:  Skilled Nursing Facility  In-House Referral:  Clinical Social Work  Discharge planning Services  CM Consult  Post Acute Care Choice:  NA Choice offered to:     DME Arranged:    DME Agency:     HH Arranged:  NA HH Agency:     Status of Service:  Completed, signed off  Medicare Important Message Given:    Date Medicare IM Given:    Medicare IM give by:    Date Additional Medicare IM Given:    Additional Medicare Important Message give by:     If discussed at [place] of Stay Meetings, dates discussed:    Additional Comments:  Ninfa Meeker, RN 08/24/2014, 2:44 PM

## 2014-08-24 NOTE — Clinical Social Work Placement (Addendum)
Deleted entered under wrong patient

## 2014-08-24 NOTE — Progress Notes (Signed)
Occupational Therapy Evaluation Patient Details Name: Karla Alexander MRN: [id number] DOB: [birth year] Today's Date: 08/24/2014    History of Present Illness s/p L reverse TSA   Clinical Impression   PTA, pt lived at [facility] and had assist with ADL as needed and ambulated at [facility] level. Began education regarding the conservative protocol as indicated in orders. Pt given written information. Began L elbow ROM. Plans to D/C to [facility] for further rehab. Will follow acutely to address established goals and facilitate D/C to SNF.     Follow Up Recommendations  SNF;Supervision/Assistance - 24 hour    Equipment Recommendations  None recommended by OT    Recommendations for Other Services       Precautions / Restrictions Precautions Precautions: Shoulder Type of Shoulder Precautions: conservative. no shoulder ROM Shoulder Interventions: Shoulder sling/immobilizer;At all times;Off for dressing/bathing/exercises Precaution Booklet Issued: Yes (comment) Required Braces or Orthoses: Sling Restrictions Weight Bearing Restrictions: Yes LUE Weight Bearing: Non weight bearing      Mobility Bed Mobility               General bed mobility comments: pt up in chair  Transfers Overall transfer level: Needs assistance   Transfers: Sit to/from Stand Sit to Stand: Min guard (s)              Balance Overall balance assessment: History of Falls (uses cane for ambulation)                                          ADL Overall ADL's : Needs assistance/impaired Eating/Feeding: Set up   Grooming: Minimal assistance   Upper Body Bathing: Moderate assistance   Lower Body Bathing: Minimal assistance   Upper Body Dressing : Moderate assistance   Lower Body Dressing: Moderate assistance   Toilet Transfer: Minimal assistance Toilet Transfer Details (indicate cue type and reason): steady         Functional mobility during ADLs: Minimal  assistance (steady A) General ADL Comments: Educate pt/family on compensatory techniques for ADL, positioning of LUE in sitting/supine, use of ice for edema control and protocol regarding sling at all times with the exception of ADL and exercise and ROM of elbow/wrist/hand only     Vision     Perception     Praxis      Pertinent Vitals/Pain Pain Assessment: 0-10 Pain Score: 6  Pain Location: L shoulder     Hand Dominance Right   Extremity/Trunk Assessment Upper Extremity Assessment Upper Extremity Assessment: LUE deficits/detail;RUE deficits/detail RUE Deficits / Details: AROM WFL; general weakness LUE Deficits / Details: s/p L reverse TSA; l elbow/wrist/hand ROM WFL LUE: Unable to fully assess due to immobilization LUE Coordination: decreased gross motor   Lower Extremity Assessment Lower Extremity Assessment: Defer to PT evaluation       Communication Communication Communication: No difficulties   Cognition Arousal/Alertness: Awake/alert Behavior During Therapy: WFL for tasks assessed/performed Overall Cognitive Status: No family/caregiver present to determine baseline cognitive functioning (most likely at baseline)                     General Comments   Family present for education    Exercises Exercises: Shoulder     Shoulder Instructions Shoulder Instructions Donning/doffing shirt without moving shoulder: Moderate assistance Method for sponge bathing under operated UE: Moderate assistance Donning/doffing sling/immobilizer: Moderate assistance Correct positioning of sling/immobilizer: Moderate  assistance ROM for elbow, wrist and digits of operated UE: Minimal assistance Sling wearing schedule (on at all times/off for ADL's): Minimal assistance Proper positioning of operated UE when showering: Minimal assistance Positioning of UE while sleeping: Minimal assistance    Home Living Family/patient expects to be discharged to:: Skilled nursing facility                                  Additional Comments: Lives at [facility]      Prior Functioning/Environment Level of Independence: Independent with assistive device(s);Needs assistance  Gait / Transfers Assistance Needed: cane at times ADL's / Homemaking Assistance Needed: CNA  assists with ADL as needed   Comments: Cane in community; pt not driving    OT Diagnosis: Generalized weakness;Acute pain   OT Problem List: Decreased strength;Decreased range of motion;Decreased activity tolerance;Decreased knowledge of precautions;Obesity;Impaired UE functional use;Pain   OT Treatment/Interventions: Self-care/ADL training;Therapeutic exercise;DME and/or AE instruction;Therapeutic activities;Patient/family education    OT Goals(Current goals can be found in the care plan section) Acute Rehab OT Goals Patient Stated Goal: to live by myself again OT Goal Formulation: With patient Time For Goal Achievement: 08/31/14 Potential to Achieve Goals: Good  OT Frequency: Min 2X/week   Barriers to D/C:            Co-evaluation              End of Session Nurse Communication: Mobility status;Precautions;Weight bearing status  Activity Tolerance: Patient tolerated treatment well Patient left: in chair;with call bell/phone within reach;with family/visitor present   Time: 1000-1030 OT Time Calculation (min): 30 min Charges:  OT General Charges $OT Visit: 1 Procedure OT Evaluation $Initial OT Evaluation Tier I: 1 Procedure OT Treatments $Therapeutic Activity: 8-22 mins G-Codes:    Dionicia Cerritos,HILLARY 09-15-2014, 10:55 AM   Maurie Boettcher, OTR/L  [phone] 2014-09-15

## 2014-08-24 NOTE — Evaluation (Signed)
Physical Therapy Evaluation Patient Details Name: Karla Alexander MRN: [id number] DOB: [birth year] Today's Date: 08/24/2014   History of Present Illness  Pt is a 81 y/o F s/p L reverse shoulder arthroplasty.  Pt's PMH includes depression, SOB, HTN, CAD, back surgery and dementia.  Clinical Impression  Patient is s/p above surgery resulting in functional limitations due to the deficits listed below (see PT Problem List). Pt w/ shuffle during ambulation and requires one person hand held assist to maintain balance. Bring cane at next session to practice gait training. Patient will benefit from skilled PT to increase their independence and safety with mobility to allow discharge to the venue listed below.      Follow Up Recommendations Supervision for mobility/OOB;SNF    Equipment Recommendations  None recommended by PT    Recommendations for Other Services       Precautions / Restrictions Precautions Precautions: Shoulder Type of Shoulder Precautions: conservative. no shoulder ROM Shoulder Interventions: Shoulder sling/immobilizer;At all times;Off for dressing/bathing/exercises Precaution Booklet Issued: Yes (comment) Required Braces or Orthoses: Sling Restrictions Weight Bearing Restrictions: Yes LUE Weight Bearing: Non weight bearing      Mobility  Bed Mobility Overal bed mobility: Modified Independent             General bed mobility comments: pt up in chair  Transfers Overall transfer level: Needs assistance Equipment used: 1 person hand held assist Transfers: Sit to/from Stand Sit to Stand: Min guard (s)         General transfer comment: 1 person hand held assist to maintain balance during transfer  Ambulation/Gait Ambulation/Gait assistance: Min assist Ambulation Distance (Feet): 25 Feet Assistive device: 1 person hand held assist Gait Pattern/deviations: Shuffle;Antalgic;Decreased stride length;Step-through pattern;Trunk flexed   Gait velocity  interpretation: <1.8 ft/sec, indicative of risk for recurrent falls General Gait Details: 1 person hand held assist ambulating in room to ensure balance maintained.  Pt reported fatigue of BLEs after ambulating 25 ft and requested to sit down.  Stairs            Wheelchair Mobility    Modified Rankin (Stroke Patients Only)       Balance Overall balance assessment: History of Falls (uses cane for ambulation) Sitting-balance support: Single extremity supported;Feet supported Sitting balance-Leahy Scale: Good     Standing balance support: Single extremity supported;During functional activity Standing balance-Leahy Scale: Fair                               Pertinent Vitals/Pain Pain Assessment: 0-10 Pain Score: 6  Pain Location: L shoulder Pain Descriptors / Indicators: Aching Pain Intervention(s): Limited activity within patient's tolerance;Monitored during session;Repositioned    Home Living Family/patient expects to be discharged to:: Skilled nursing facility                 Additional Comments: Lives at [facility]    Prior Function Level of Independence: Independent with assistive device(s);Needs assistance   Gait / Transfers Assistance Needed: cane at times  ADL's / Homemaking Assistance Needed: CNA  assists with ADL as needed  Comments: Cane in community; pt not driving     Hand Dominance   Dominant Hand: Right    Extremity/Trunk Assessment   Upper Extremity Assessment: LUE deficits/detail;RUE deficits/detail RUE Deficits / Details: AROM WFL; general weakness     LUE Deficits / Details: s/p L reverse TSA; l elbow/wrist/hand ROM WFL   Lower Extremity Assessment: Defer to PT evaluation  RLE Deficits / Details: Pt reports she has been told she has arthritis in B knees and L ankle LLE Deficits / Details: Pt reports she has been told she has arthritis in B knees and L ankle  Cervical / Trunk Assessment: Kyphotic  Communication    Communication: No difficulties  Cognition Arousal/Alertness: Awake/alert Behavior During Therapy: WFL for tasks assessed/performed Overall Cognitive Status: No family/caregiver present to determine baseline cognitive functioning (most likely at baseline)                      General Comments      Exercises Total Joint Exercises Ankle Circles/Pumps: AROM;Both;15 reps;Supine Quad Sets: AROM;Both;5 reps;Supine Long Arc Quad: AROM;Both;10 reps;Seated Shoulder Exercises Elbow Flexion: Left;AAROM;10 reps Elbow Extension: AAROM;Left;10 reps;Seated Wrist Flexion: AROM;Left;10 reps Wrist Extension: AROM;Left;10 reps Digit Composite Flexion: AROM;Left;10 reps Composite Extension: AROM;Left;10 reps Neck Flexion: AROM;5 reps Neck Extension: AROM;5 reps Neck Lateral Flexion - Right: AROM;5 reps Neck Lateral Flexion - Left: AROM;5 reps Donning/doffing shirt without moving shoulder: Moderate assistance Method for sponge bathing under operated UE: Moderate assistance Donning/doffing sling/immobilizer: Moderate assistance Correct positioning of sling/immobilizer: Moderate assistance ROM for elbow, wrist and digits of operated UE: Minimal assistance Sling wearing schedule (on at all times/off for ADL's): Minimal assistance Proper positioning of operated UE when showering: Minimal assistance Positioning of UE while sleeping: Minimal assistance      Assessment/Plan    PT Assessment Patient needs continued PT services  PT Diagnosis Difficulty walking;Abnormality of gait;Generalized weakness;Acute pain   PT Problem List Decreased strength;Decreased range of motion;Decreased activity tolerance;Decreased balance;Decreased mobility;Decreased coordination;Decreased cognition;Decreased knowledge of use of DME;Decreased safety awareness;Decreased knowledge of precautions;Decreased skin integrity;Pain  PT Treatment Interventions DME instruction;Gait training;Functional mobility  training;Therapeutic activities;Therapeutic exercise;Balance training;Neuromuscular re-education;Cognitive remediation;Patient/family education;Modalities   PT Goals (Current goals can be found in the Care Plan section) Acute Rehab PT Goals Patient Stated Goal: to live by myself again PT Goal Formulation: With patient Time For Goal Achievement: 08/31/14 Potential to Achieve Goals: Good    Frequency Min 3X/week   Barriers to discharge        Co-evaluation               End of Session   Activity Tolerance: Patient limited by fatigue Patient left: in chair;with call bell/phone within reach Nurse Communication: Mobility status;Precautions;Weight bearing status         Time: 5638-7564 PT Time Calculation (min) (ACUTE ONLY): 14 min   Charges:   PT Evaluation $Initial PT Evaluation Tier I: 1 Procedure     PT G CodesJoslyn Hy PT, DPT [phone] Pager: [phone] 08/24/2014, 11:10 AM

## 2014-08-24 NOTE — Clinical Social Work Note (Addendum)
Deleted entered under wrong patient

## 2014-08-24 NOTE — Discharge Instructions (Signed)

## 2014-08-25 LAB — TYPE AND SCREEN
ABO/RH(D): O POS
Antibody Screen: NEGATIVE
UNIT DIVISION: 0

## 2014-08-25 NOTE — Clinical Social Work Placement (Signed)
   CLINICAL SOCIAL WORK PLACEMENT  NOTE  Date:  08/25/2014  Patient Details  Name: Karla Alexander MRN: [id number] Date of Birth: 12-Dec-1932  Clinical Social Work is seeking post-discharge placement for this patient at the [place] level of care (*CSW will initial, date and re-position this form in  chart as items are completed):  Yes   Patient/family provided with [place] Work Department's list of facilities offering this level of care within the geographic area requested by the patient (or if unable, by the patient's family).  Yes   Patient/family informed of their freedom to choose among providers that offer the needed level of care, that participate in Medicare, Medicaid or managed care program needed by the patient, have an available bed and are willing to accept the patient.  Yes   Patient/family informed of [facility]'s ownership interest in [facility] and [facility], as well as of the fact that they are under no obligation to receive care at these facilities.  PASRR submitted to EDS on       PASRR number received on       Existing PASRR number confirmed on 08/25/14     FL2 transmitted to all facilities in geographic area requested by pt/family on 08/25/14     FL2 transmitted to all facilities within larger geographic area on       Patient informed that his/her managed care company has contracts with or will negotiate with certain facilities, including the following:        Yes   Patient/family informed of bed offers received.  Patient chooses bed at       Physician recommends and patient chooses bed at      Patient to be transferred to [facility] on  .  Patient to be transferred to facility by       Patient family notified on   of transfer.  Name of family member notified:        PHYSICIAN Please sign FL2     Additional Comment:    _______________________________________________ Pete Pelt 08/25/2014, 3:20 PM

## 2014-08-25 NOTE — Progress Notes (Signed)
Subjective: 2 Days Post-Op Procedure(s) (LRB): REVERSE SHOULDER ARTHROPLASTY WITH APPLICATION OF CABLES  (Left) Patient reports pain as mild.  She is awaiting discharge to skilled nursing facility. Taking by mouth and voiding okay.  Objective: Vital signs in last 24 hours: Temp:  [98.3 F (36.8 C)-99.7 F (37.6 C)] 98.5 F (36.9 C) (06/04 0629) Pulse Rate:  [82-102] 100 (06/04 0629) Resp:  [16-18] 18 (06/03 1309) BP: (95-121)/(36-59) 109/45 mmHg (06/04 0629) SpO2:  [92 %-96 %] 96 % (06/04 0629)  Intake/Output from previous day: 06/03 0701 - 06/04 0700 In: 815 [P.O.:480; Blood:335] Out: 300 [Urine:300] Intake/Output this shift:     Recent Labs  08/24/14 0625 08/24/14 1900  HGB 7.3* 8.7*    Recent Labs  08/24/14 0625 08/24/14 1900  WBC 10.2  --   RBC 2.67*  --   HCT 22.8* 26.5*  PLT 223  --     Recent Labs  08/24/14 0625  NA 134*  K 3.9  CL 102  CO2 24  BUN 20  CREATININE 1.23*  GLUCOSE 133*  CALCIUM 8.1*   No results for input(s): LABPT, INR in the last 72 hours. Left shoulder exam: Neurovascular intact Sensation intact distally Intact pulses distally Incision: dressing C/D/I Sling intact. Moves hand well. Normal sensation in fingers.  Assessment/Plan: 2 Days Post-Op Procedure(s) (LRB): REVERSE SHOULDER ARTHROPLASTY WITH APPLICATION OF CABLES  (Left) Plan: Continue OT. I spoke with the case manager/social worker about going to skilled nursing facility. She will need short-term rehabilitation there. They will work on these details. She is ready to go once details are clarified.  My cell number is [place] 08/25/2014, 8:45 AM

## 2014-08-25 NOTE — Progress Notes (Signed)
Occupational Therapy Treatment Patient Details Name: Karla Alexander MRN: [id number] DOB: 1932-07-08 Today's Date: 08/25/2014    History of present illness s/p L reverse TSA   OT comments  This 81 yo female making progress with therapy for tolerating mobility and elbow ROM on LUE. She will continue to benefit from acute OT with follow up at SNF for arm and BADLs.  Follow Up Recommendations  SNF;Supervision/Assistance - 24 hour    Equipment Recommendations  None recommended by OT       Precautions / Restrictions Precautions Precautions: Shoulder;Fall Type of Shoulder Precautions: conservative. no shoulder ROM--only AAROM for elbow, wrist , hand Shoulder Interventions: Shoulder sling/immobilizer;At all times;Off for dressing/bathing/exercises Required Braces or Orthoses: Sling Restrictions Weight Bearing Restrictions: Yes LUE Weight Bearing: Non weight bearing       Mobility Bed Mobility Overal bed mobility: Needs Assistance Bed Mobility: Supine to Sit     Supine to sit: Supervision;HOB elevated        Transfers Overall transfer level: Needs assistance Equipment used: 1 person hand held assist Transfers: Sit to/from [facility] Sit to Stand: Min assist Stand pivot transfers: Min assist                ADL Overall ADL's : Needs assistance/impaired                                       General ADL Comments: Pt hot and sweaty in bed when I entered so wanted to get OOB. To BSC with min A then to recliner with min A (HHA +1).      Vision                 Additional Comments: No change from baseline          Cognition   Behavior During Therapy: WFL for tasks assessed/performed Overall Cognitive Status: No family/caregiver present to determine baseline cognitive functioning                         Exercises Other Exercises Other Exercises: 10 AAROM exercises for left elbow and sling repositioned properly  (had worked its way backwards to only being 1/2 way on her arm while she was supine in bed)           Pertinent Vitals/ Pain       Pain Assessment: 0-10 Pain Score: 4  Pain Location: left shoulder Pain Descriptors / Indicators: Aching;Sore Pain Intervention(s): Monitored during session         Frequency Min 2X/week     Progress Toward Goals  OT Goals(current goals can now be found in the care plan section)  Progress towards OT goals: Progressing toward goals     Plan Discharge plan remains appropriate       End of Session Equipment Utilized During Treatment:  (sling)   Activity Tolerance Patient tolerated treatment well   Patient Left in chair;with call bell/phone within reach;with family/visitor present           Time: 3151-7616 OT Time Calculation (min): 32 min  Charges: OT General Charges $OT Visit: 1 Procedure OT Treatments $Self Care/Home Management : 8-22 mins $Therapeutic Exercise: 8-22 mins  Almon Register [phone] 08/25/2014, 2:13 PM

## 2014-08-25 NOTE — Clinical Social Work Note (Signed)
Clinical Social Work Assessment  Patient Details  Name: [place] B Knaak MRN: [id number] Date of Birth: 06/17/1932  Date of referral:  08/25/14               Reason for consult:  Facility Placement, Guardianship Needs, Discharge Planning                Permission sought to share information with:  Facility Sport and exercise psychologist, Guardian Permission granted to share information::  Yes, Verbal Permission Granted  Name::     Kalman Drape  [facility]) or on call Microbiologist::  [place] ([place] facilities - extended search)  Relationship::  Regulatory affairs officer Information:  [phone] or after hours social worker at [phone]  Housing/Transportation Living arrangements for the past 2 months:  [place] of Information:  Patient, Facility, Guardian Patient Interpreter Needed:  None Criminal Activity/Legal Involvement Pertinent to Current Situation/Hospitalization:  No - Comment as needed Significant Relationships:  Other Family Members Lives with:  Facility Resident Do you feel safe going back to the place where you live?  Yes (feels that she needs assistance when discharged) Need for family participation in patient care:  No (Coment) (Pt under guardianship)  Care giving concerns:  Social Services has guardianship of Pt.   Social Worker assessment / plan:  CSW met with the Pt and family Dian Queen and Lisabeth Register) at the bedside. Pt was alert and oriented x4. CSW introduced self and reason for assessment. Pt was aware that Pt would need SNF at time of d/c. Pt stated that Pt would like to return to [facility] as "they are nice to me." Pt is aware that she will not be able to go back to her Assisted living arrangement until she is finished with her rehabilitation. Pt is agreeable to SNF, however due to Pt having guardianship from [employer], [facility] contacted [facility] on Radiographer, therapeutic to gain permission to send Pt to  SNF placement at given location. Family did have concerns about Pt returning to that facility, however social services is aware of their concerns and would like Pt to return to facility.   Employment status:  Disabled (Comment on whether or not currently receiving Disability) Insurance information:    PT Recommendations:  [place] / Referral to community resources:  [place]  Patient/Family's Response to care: Pt and family are appreciative for assistance with discharge planning.   Patient/Family's Understanding of and Emotional Response to Diagnosis, Current Treatment, and Prognosis:  Pt is aware "she broke her shoulder" and would need rehab.   Emotional Assessment Appearance:  Appears stated age Attitude/Demeanor/Rapport:   (appropriate) Affect (typically observed):  Accepting, Appropriate, Pleasant Orientation:  Oriented to Self, Oriented to Place, Oriented to  Time, Oriented to Situation Alcohol / Substance use:  Not Applicable Psych involvement (Current and /or in the community):  No (Comment)  Discharge Needs  Concerns to be addressed:  Decision making concerns Readmission within the last 30 days:  No Current discharge risk:  None Barriers to Discharge:  No Barriers Identified   Pete Pelt 08/25/2014, 2:45 PM

## 2014-08-26 NOTE — Progress Notes (Signed)
Occupational Therapy Treatment Patient Details Name: Karla Alexander MRN: [id number] DOB: 1932/10/10 Today's Date: 08/26/2014    History of present illness s/p L reverse TSA   OT comments  This 81 yo female not making progress today (same as yesterday for elbow exercises and toileting). No family present to go over exercises with them (they were there yesterday, but only at end of session when I came back from heating up pt's food). She will continue to benefit from OT acutely and post acute to increase independence with BADLs and mobility and exercises as MD allows to her to progress.  Follow Up Recommendations  SNF;Supervision/Assistance - 24 hour    Equipment Recommendations  None recommended by OT       Precautions / Restrictions Precautions Precautions: Shoulder;Fall Type of Shoulder Precautions: conservative. no shoulder ROM--only AAROM for elbow, wrist , hand Shoulder Interventions: Shoulder sling/immobilizer;At all times;Off for dressing/bathing/exercises Required Braces or Orthoses: Sling Restrictions Weight Bearing Restrictions: Yes LUE Weight Bearing: Non weight bearing       Mobility Bed Mobility               General bed mobility comments: Pt up on BSC when I entered room with NT with her  Transfers Overall transfer level: Needs assistance Equipment used: 1 person hand held assist Transfers: Sit to/from [facility] Sit to Stand: Min assist Stand pivot transfers: Min assist            Balance           Standing balance support: Single extremity supported;During functional activity Standing balance-Leahy Scale: Poor                     ADL                           Toilet Transfer: Minimal assistance;Stand-pivot;BSC   Toileting- Clothing Manipulation and Hygiene: Total assistance (with min A sit<>stand)                Vision                 Additional Comments: No change from baseline         Cognition   Behavior During Therapy: WFL for tasks assessed/performed Overall Cognitive Status: No family/caregiver present to determine baseline cognitive functioning                         Exercises Other Exercises Other Exercises: 10 AAROM exercises for left elbow and sling repositioned properly (had worked its way backwards to only being 3/4 way on her arm)           Pertinent Vitals/ Pain       Pain Assessment: 0-10 Pain Score: 6  Pain Location: left shoulder Pain Descriptors / Indicators: Aching;Sore Pain Intervention(s): Monitored during session;Repositioned;Patient requesting pain meds-RN notified         Frequency Min 2X/week     Progress Toward Goals  OT Goals(current goals can now be found in the care plan section)  Progress towards OT goals: Not progressing toward goals - comment (same as yesterday)     Plan Discharge plan remains appropriate       End of Session Equipment Utilized During Treatment:  (sling)   Activity Tolerance Patient tolerated treatment well   Patient Left in chair;with call bell/phone within reach   Nurse Communication  (NT: if pt gets really hot  again today she will call you for an ice pack to put on her neck)        Time: 2924-4628 OT Time Calculation (min): 15 min  Charges: OT General Charges $OT Visit: 1 Procedure OT Treatments $Therapeutic Activity: 8-22 mins  Almon Register [phone] 08/26/2014, 8:25 AM

## 2014-08-26 NOTE — Progress Notes (Signed)
Subjective: 3 Days Post-Op Procedure(s) (LRB): REVERSE SHOULDER ARTHROPLASTY WITH APPLICATION OF CABLES  (Left) Patient reports pain as mild. Patient seen and evaluated today at 9 am.   Objective: Vital signs in last 24 hours: Temp:  [97.9 F (36.6 C)-99.4 F (37.4 C)] 98.6 F (37 C) (06/05 1944) Pulse Rate:  [86-90] 90 (06/05 1944) Resp:  [18-20] 20 (06/05 1944) BP: (120-130)/(50-85) 124/50 mmHg (06/05 1944) SpO2:  [96 %-98 %] 96 % (06/05 1944)  Intake/Output from previous day: 06/04 0701 - 06/05 0700 In: 360 [P.O.:360] Out: -  Intake/Output this shift:     Recent Labs  08/24/14 0625 08/24/14 1900  HGB 7.3* 8.7*    Recent Labs  08/24/14 0625 08/24/14 1900  WBC 10.2  --   RBC 2.67*  --   HCT 22.8* 26.5*  PLT 223  --     Recent Labs  08/24/14 0625  NA 134*  K 3.9  CL 102  CO2 24  BUN 20  CREATININE 1.23*  GLUCOSE 133*  CALCIUM 8.1*   No results for input(s): LABPT, INR in the last 72 hours. Left shoulder exam: Some left shoulder swelling. Dressing clean and dry.Minimal pain with rom which is limited.N-V status intact to left upper extremity.   Assessment/Plan: 3 Days Post-Op Procedure(s) (LRB): REVERSE SHOULDER ARTHROPLASTY WITH APPLICATION OF CABLES  (Left)  Plan: Cont OT Cont in sling To SNF Monday am   Sumaya Riedesel G 08/26/2014, 10:00 PM

## 2014-08-27 DIAGNOSIS — Z96612 Presence of left artificial shoulder joint: Secondary | ICD-10-CM | POA: Diagnosis not present

## 2014-08-27 DIAGNOSIS — M21212 Flexion deformity, left shoulder: Secondary | ICD-10-CM | POA: Diagnosis not present

## 2014-08-27 DIAGNOSIS — E785 Hyperlipidemia, unspecified: Secondary | ICD-10-CM | POA: Diagnosis not present

## 2014-08-27 DIAGNOSIS — L03114 Cellulitis of left upper limb: Secondary | ICD-10-CM | POA: Diagnosis not present

## 2014-08-27 DIAGNOSIS — M810 Age-related osteoporosis without current pathological fracture: Secondary | ICD-10-CM | POA: Diagnosis not present

## 2014-08-27 DIAGNOSIS — F0390 Unspecified dementia without behavioral disturbance: Secondary | ICD-10-CM | POA: Diagnosis not present

## 2014-08-27 DIAGNOSIS — Z9889 Other specified postprocedural states: Secondary | ICD-10-CM | POA: Diagnosis not present

## 2014-08-27 DIAGNOSIS — F329 Major depressive disorder, single episode, unspecified: Secondary | ICD-10-CM | POA: Diagnosis not present

## 2014-08-27 DIAGNOSIS — I2510 Atherosclerotic heart disease of native coronary artery without angina pectoris: Secondary | ICD-10-CM | POA: Diagnosis not present

## 2014-08-27 DIAGNOSIS — Z471 Aftercare following joint replacement surgery: Secondary | ICD-10-CM | POA: Diagnosis not present

## 2014-08-27 DIAGNOSIS — E039 Hypothyroidism, unspecified: Secondary | ICD-10-CM | POA: Diagnosis not present

## 2014-08-27 DIAGNOSIS — K219 Gastro-esophageal reflux disease without esophagitis: Secondary | ICD-10-CM | POA: Diagnosis not present

## 2014-08-27 DIAGNOSIS — I10 Essential (primary) hypertension: Secondary | ICD-10-CM | POA: Diagnosis not present

## 2014-08-27 DIAGNOSIS — M1990 Unspecified osteoarthritis, unspecified site: Secondary | ICD-10-CM | POA: Diagnosis not present

## 2014-08-27 DIAGNOSIS — J449 Chronic obstructive pulmonary disease, unspecified: Secondary | ICD-10-CM | POA: Diagnosis not present

## 2014-08-27 NOTE — Progress Notes (Signed)
Physical Therapy Treatment Patient Details Name: Karla Alexander MRN: [id number] DOB: [birth year] Today's Date: 08/27/2014    History of Present Illness Pt is a 81 y/o F s/p L reverse shoulder arthroplasty.  Pt's PMH includes depression, SOB, HTN, CAD, back surgery and dementia.    PT Comments    Pt required min assist to maintain balance upon standing from bed; however pt required min guard assist once provided w/ SPC.  Pt w/ shuffle during ambulation w/ improved minimally w/ verbal cues.  Pt will benefit from continued skilled PT services to increase functional independence and safety.   Follow Up Recommendations  Supervision for mobility/OOB;SNF     Equipment Recommendations  None recommended by PT    Recommendations for Other Services       Precautions / Restrictions Precautions Precautions: Shoulder;Fall Type of Shoulder Precautions: conservative. no shoulder ROM--only AAROM for elbow, wrist , hand Shoulder Interventions: Shoulder sling/immobilizer;At all times;Off for dressing/bathing/exercises Required Braces or Orthoses: Sling Restrictions Weight Bearing Restrictions: Yes LUE Weight Bearing: Non weight bearing    Mobility  Bed Mobility Overal bed mobility: Needs Assistance Bed Mobility: Supine to Sit     Supine to sit: Supervision;HOB elevated     General bed mobility comments: Supervision and min VCs for technique using bed rails.  Transfers Overall transfer level: Needs assistance Equipment used: Straight cane Transfers: Sit to/from Stand Sit to Stand: Min assist         General transfer comment: Min assist as pt has difficulty maintaining balance upon standing from bed and requires min assist to maintain balance.  Pt able to maintain balance w/ min guard assist once pt has SPC.  Ambulation/Gait Ambulation/Gait assistance: Min guard Ambulation Distance (Feet): 70 Feet Assistive device: Straight cane Gait Pattern/deviations: Step-through  pattern;Antalgic;Shuffle;Decreased stride length   Gait velocity interpretation: Below normal speed for age/gender General Gait Details: Min guard assist for safety w/ use of SPC.  Pt shuffling feet which improves minimally w/ verbal cues to pick feet up off of floor w/ ambulation.   Stairs            Wheelchair Mobility    Modified Rankin (Stroke Patients Only)       Balance Overall balance assessment: Needs assistance Sitting-balance support: Single extremity supported;Feet supported Sitting balance-Leahy Scale: Good     Standing balance support: Single extremity supported;During functional activity Standing balance-Leahy Scale: Poor                      Cognition Arousal/Alertness: Awake/alert Behavior During Therapy: WFL for tasks assessed/performed Overall Cognitive Status: Within Functional Limits for tasks assessed                      Exercises      General Comments        Pertinent Vitals/Pain Pain Assessment: 0-10 Pain Score: 4  Pain Location: L shoulder Pain Descriptors / Indicators: Aching;Discomfort Pain Intervention(s): Limited activity within patient's tolerance;Monitored during session;Repositioned    Home Living                      Prior Function            PT Goals (current goals can now be found in the care plan section) Acute Rehab PT Goals Patient Stated Goal: to go to rehab today Progress towards PT goals: Progressing toward goals    Frequency  Min 3X/week    PT Plan Current plan remains  appropriate    Co-evaluation             End of Session Equipment Utilized During Treatment: Gait belt Activity Tolerance: Patient tolerated treatment well Patient left: in chair;with call bell/phone within reach;with family/visitor present     Time: [phone] (Pt on toilet for 10 minutes during session) PT Time Calculation (min) (ACUTE ONLY): 32 min  Charges:  $Gait Training: 8-22 mins                     G CodesJoslyn Hy PT, [place] [phone] Pager: [phone] 08/27/2014, 12:00 PM

## 2014-08-27 NOTE — Clinical Social Work Placement (Signed)
   CLINICAL SOCIAL WORK PLACEMENT  NOTE  Date:  08/27/2014  Patient Details  Name: Karla Alexander MRN: [id number] Date of Birth: 06/22/1932  Clinical Social Work is seeking post-discharge placement for this patient at the [facility] level of care (*CSW will initial, date and re-position this form in  chart as items are completed):  Yes   Patient/family provided with [facility] Work Department's list of facilities offering this level of care within the geographic area requested by the patient (or if unable, by the patient's family).  Yes   Patient/family informed of their freedom to choose among providers that offer the needed level of care, that participate in Medicare, Medicaid or managed care program needed by the patient, have an available bed and are willing to accept the patient.  Yes   Patient/family informed of [facility]'s ownership interest in [facility] and [facility], as well as of the fact that they are under no obligation to receive care at these facilities.  PASRR submitted to EDS on       PASRR number received on       Existing PASRR number confirmed on 08/25/14     FL2 transmitted to all facilities in geographic area requested by pt/family on 08/25/14     FL2 transmitted to all facilities within larger geographic area on       Patient informed that his/her managed care company has contracts with or will negotiate with certain facilities, including the following:        Yes   Patient/family informed of bed offers received.  Patient chooses bed at       Physician recommends and patient chooses bed at      Patient to be transferred to [facility] on 08/27/14.  Patient to be transferred to facility by [facility]     Patient family notified on 08/27/14 of transfer.  Name of family member notified:  Lorenza Chick and guardian, Astrid Divine from [place] Please sign FL2     Additional Comment:     _______________________________________________ Dulcy Fanny, LCSW 08/27/2014, 12:14 PM

## 2014-08-27 NOTE — Progress Notes (Signed)
   PATIENT ID: [place]   4 Days Post-Op Procedure(s) (LRB): REVERSE SHOULDER ARTHROPLASTY WITH APPLICATION OF CABLES  (Left)  Subjective: minimal pain today, no complaints, hopes to d/c to SNF today  Objective:  Filed Vitals:   08/27/14 0527  BP: 136/61  Pulse: 81  Temp: 97.9 F (36.6 C)  Resp: 20    Left UE dressing saturated with blood, changed today Incision benign Repositioned sling Wiggles fingers, distally NVI   Labs:   Recent Labs  08/24/14 1900  HGB 8.7*   Recent Labs  08/24/14 1900  HCT 26.5*  No results for input(s): NA, K, CL, CO2, BUN, CREATININE, GLUCOSE, CALCIUM in the last 72 hours.  Assessment and Plan: 4 days s/p REVERSE SHOULDER ARTHROPLASTY WITH APPLICATION OF CABLES (Left) ABLA- asymptomatic s/p tranfusion Sling, hand, wrist elbow ROM only D/c today to SNF Percocet for pain rx signed, has norco at home and can go back to this if tolerable  VTE proph: ASA 325mg  BID, SCDs

## 2014-08-27 NOTE — Discharge Planning (Signed)
Patient will discharge today per MD order. Patient will discharge to [facility] RN to call report prior to transportation to: [phone] Transportation: [facility]  CSW sent discharge summary to SNF for review.  Packet is complete.  RN, patient, family and DSS guardian aware of discharge plans.  [name], [place] [phone]  Psychiatric & Orthopedics (5N 1-16) Clinical Social Worker

## 2014-08-27 NOTE — Discharge Summary (Signed)
Patient ID: Karla B [place]med[place] MRN: [id number] DOB/AGE: 03/28/1932 81 y.o.  Admit date: 08/23/2014 Discharge date: 08/27/2014  Admission Diagnoses:  Active Problems:   Proximal humerus fracture   Discharge Diagnoses:  Same  Past Medical History  Diagnosis Date  . Depression   . GERD (gastroesophageal reflux disease)   . Hypothyroidism   . Shortness of breath   . Arthritis   . Hypertension     dr Delsa Sale   [place]  . Coronary artery disease     non-obstructive CAD 04/2013  . Constipation   . Seasonal allergies   . Hyperlipemia   . Dementia     Surgeries: Procedure(s): REVERSE SHOULDER ARTHROPLASTY WITH APPLICATION OF CABLES  on 08/23/2014   Consultants:    Discharged Condition: Improved  Hospital Course: Karla Alexander is an 81 y.o. female who was admitted 08/23/2014 for operative treatment of a chronic proximal humerus fracture with nonunion. Patient has severe unremitting pain that affects sleep, daily activities, and work/hobbies. After pre-op clearance the patient was taken to the operating room on 08/23/2014 and underwent  Procedure(s): REVERSE SHOULDER ARTHROPLASTY WITH APPLICATION OF CABLES .    Patient was given perioperative antibiotics: Anti-infectives    Start     Dose/Rate Route Frequency Ordered Stop   08/23/14 1800  ceFAZolin (ANCEF) IVPB 1 g/50 mL premix     1 g 100 mL/hr over 30 Minutes Intravenous Every 6 hours 08/23/14 1745 08/24/14 1144   08/23/14 1000  ceFAZolin (ANCEF) IVPB 2 g/50 mL premix     2 g 100 mL/hr over 30 Minutes Intravenous To Surgery 08/22/14 1156 08/23/14 1110       Patient was given sequential compression devices, early ambulation, and ASA 325mg  BID to prevent DVT.  Patient benefited maximally from hospital stay. Postoperative ABLA- improved and asymptomatic after 1 unit packed RBC transfusion.   Otherwise there were no complications.    Recent vital signs: Patient Vitals for the past 24 hrs:  BP Temp Temp src Pulse Resp SpO2   08/27/14 0527 136/61 mmHg 97.9 F (36.6 C) Oral 81 20 96 %  08/26/14 2224 - - - - - 94 %  08/26/14 1944 (!) 124/50 mmHg 98.6 F (37 C) Oral 90 20 96 %  08/26/14 1401 (!) 120/52 mmHg 99.4 F (37.4 C) Oral 86 20 96 %  08/26/14 0834 - - - - - 96 %     Recent laboratory studies:  Recent Labs  08/24/14 1900  HGB 8.7*  HCT 26.5*     Discharge Medications:     Medication List    STOP taking these medications        acetaminophen 325 MG tablet  Commonly known as:  TYLENOL     HYDROcodone-acetaminophen 5-325 MG per tablet  Commonly known as:  NORCO/VICODIN     meloxicam 15 MG tablet  Commonly known as:  MOBIC      TAKE these medications        albuterol 108 (90 BASE) MCG/ACT inhaler  Commonly known as:  PROVENTIL HFA;VENTOLIN HFA  Inhale 1 puff into the lungs every 6 (six) hours as needed for wheezing or shortness of breath.     alendronate 70 MG tablet  Commonly known as:  FOSAMAX  Take 70 mg by mouth once a week. Take with a full glass of water on an empty stomach.     alum & mag hydroxide-simeth 200-200-20 MG/5ML suspension  Commonly known as:  MAALOX/MYLANTA  Take 30 mLs by mouth every  4 (four) hours as needed for indigestion or heartburn.     aspirin 325 MG tablet  Take 325 mg by mouth daily.     Cholecalciferol 1000 UNITS capsule  Take 1,000 Units by mouth daily.     citalopram 10 MG tablet  Commonly known as:  CELEXA  Take 20 mg by mouth daily.     clonazePAM 0.5 MG tablet  Commonly known as:  KLONOPIN  Take 0.5 mg by mouth 2 (two) times daily as needed for anxiety.     docusate sodium 100 MG capsule  Commonly known as:  COLACE  Take 100 mg by mouth daily as needed for mild constipation.     esomeprazole 20 MG capsule  Commonly known as:  NEXIUM  Take 20 mg by mouth daily at 12 noon.     fluticasone 50 MCG/ACT nasal spray  Commonly known as:  FLONASE  Place 1 spray into both nostrils daily.     Fluticasone-Salmeterol 100-50 MCG/DOSE Aepb   Commonly known as:  ADVAIR  Inhale 1 puff into the lungs 2 (two) times daily.     guaiFENesin 100 MG/5ML Soln  Commonly known as:  ROBITUSSIN  Take 20 mLs by mouth every 4 (four) hours as needed for cough or to loosen phlegm.     levothyroxine 100 MCG tablet  Commonly known as:  SYNTHROID, LEVOTHROID  Take 100 mcg by mouth daily before breakfast.     lovastatin 20 MG tablet  Commonly known as:  MEVACOR  Take 20 mg by mouth.     metoprolol 50 MG tablet  Commonly known as:  LOPRESSOR  Take 25 mg by mouth 2 (two) times daily.     oxyCODONE-acetaminophen 5-325 MG per tablet  Commonly known as:  ROXICET  Take 1-2 tablets by mouth every 4 (four) hours as needed for severe pain.     triamterene-hydrochlorothiazide 37.5-25 MG per capsule  Commonly known as:  DYAZIDE  Take 1 capsule by mouth daily.        Diagnostic Studies: Dg Chest 2 View  08/15/2014   CLINICAL DATA:  Preoperative evaluation for reverse left shoulder arthroplasty. Hypertension.  EXAM: CHEST  2 VIEW  COMPARISON:  May 15, 2013 chest radiograph; left humerus CT July 13, 2014  FINDINGS: There is chronic central peribronchial thickening consistent with a degree of chronic bronchitis. There is no frank edema or consolidation. Heart is upper normal in size with pulmonary vascularity within normal limits. No adenopathy. There is atherosclerotic change in aorta. There is a comminuted fracture proximal left humerus, noted previously.  IMPRESSION: Evidence of chronic bronchitis centrally on both sides. No edema or consolidation. Old fracture proximal left humerus.   Electronically Signed   By: Lowella Grip III M.D.   On: 08/15/2014 13:58   Dg Humerus Left  08/23/2014   CLINICAL DATA:  Postop left shoulder arthroplasty.  EXAM: LEFT HUMERUS - 1 VIEW  COMPARISON:  CT 07/13/2014. Intraoperative radiographs earlier today.  FINDINGS: Single AP view obtained portably. Patient has undergone left shoulder reverse arthroplasty with  a long humeral prosthesis secured by 2 cerclage wires. The glenoid component appears well positioned. There is no evidence of acute fracture or dislocation on single AP view. Surgical clips are in place.  IMPRESSION: No demonstrated complication following left shoulder reverse arthroplasty.   Electronically Signed   By: Richardean Sale M.D.   On: 08/23/2014 19:31   Dg Humerus Left  08/23/2014   CLINICAL DATA:  Previous left shoulder arthroplasty  EXAM: LEFT HUMERUS - 2+ VIEW; DG C-ARM GT 120 MIN  COMPARISON:  07/13/2014  FINDINGS: Two views of the left shoulder submitted. The patient is status post intraoperative repair of left proximal humeral fracture. There is a left shoulder prosthesis with a long intra medullary rod component. There is anatomic alignment. Cerclage wires are noted in proximal humerus.  IMPRESSION: Status post intraoperative repair of left humeral fracture. Left shoulder prosthesis in anatomic alignment.  Fluoroscopy time was 14 seconds.   Electronically Signed   By: Lahoma Crocker M.D.   On: 08/23/2014 14:49   Dg C-arm Gt 120 Min  08/23/2014   CLINICAL DATA:  Previous left shoulder arthroplasty  EXAM: LEFT HUMERUS - 2+ VIEW; DG C-ARM GT 120 MIN  COMPARISON:  07/13/2014  FINDINGS: Two views of the left shoulder submitted. The patient is status post intraoperative repair of left proximal humeral fracture. There is a left shoulder prosthesis with a long intra medullary rod component. There is anatomic alignment. Cerclage wires are noted in proximal humerus.  IMPRESSION: Status post intraoperative repair of left humeral fracture. Left shoulder prosthesis in anatomic alignment.  Fluoroscopy time was 14 seconds.   Electronically Signed   By: Lahoma Crocker M.D.   On: 08/23/2014 14:49    Disposition: 03-Skilled Nursing Facility      Discharge Instructions    Call MD / Call 911    Complete by:  As directed   If you experience chest pain or shortness of breath, CALL 911 and be transported to the  hospital emergency room.  If you develope a fever above 101 F, pus (white drainage) or increased drainage or redness at the wound, or calf pain, call your surgeon's office.     Call MD / Call 911    Complete by:  As directed   If you experience chest pain or shortness of breath, CALL 911 and be transported to the hospital emergency room.  If you develope a fever above 101 F, pus (white drainage) or increased drainage or redness at the wound, or calf pain, call your surgeon's office.     Constipation Prevention    Complete by:  As directed   Drink plenty of fluids.  Prune juice may be helpful.  You may use a stool softener, such as Colace (over the counter) 100 mg twice a day.  Use MiraLax (over the counter) for constipation as needed.     Constipation Prevention    Complete by:  As directed   Drink plenty of fluids.  Prune juice may be helpful.  You may use a stool softener, such as Colace (over the counter) 100 mg twice a day.  Use MiraLax (over the counter) for constipation as needed.     Diet - low sodium heart healthy    Complete by:  As directed      Diet - low sodium heart healthy    Complete by:  As directed      Increase activity slowly as tolerated    Complete by:  As directed      Increase activity slowly as tolerated    Complete by:  As directed            Follow-up Information    Follow up with Nita Sells, MD. Schedule an appointment as soon as possible for a visit in 2 weeks.   Specialty:  Orthopedic Surgery   Contact information:   [place] [address] [phone]  SignedGrier Mitts 08/27/2014, 8:33 AM

## 2014-08-27 NOTE — Progress Notes (Signed)
  Pharmacy Discharge Medication Therapy Review   Total Number of meds on admission ____19______ (polypharmacy > 10 meds)  Indications for all medications: [x]  Yes       []  No  Adherence Review  []  Excellent (no doses missed/week)     []  Good (no more than 1 dose missed/week)     []  Partial (2-3 doses missed/week)     []  Poor (>3 doses missed/week)     [x]  Not Assessed  Total number of high risk medications __1__ (Anticoagulants, Dual antiplatelets, oral Antihyperglycemic agents,Insulins, Antipsychotics, Anti-Seizure meds, Inhalers, HF/ACS meds, Antibiotics and HIV medications)   Assessment: (Medication related problems)  Intervention  YES NO  Explanation   Indications      Medication without noted indication []  [x]     Indication without noted medication []  [x]     Duplicate therapy []  [x]    Efficacy      Suboptimal drug or dose selection [x]  []  Consider reducing Aspirin dose to 81 mg daily.  Insufficient dose/duration []  [x]    Failure to receive therapy  (Rx not filled) []  [x]     Safety      Adverse drug event []  [x]     Drug interaction []  [x]     Excessive dose/duration []  [x]    High-risk medications []  [x]    Compliance     Underuse []  [x]     Overuse []  [x]    Other pertinent pharmacist counseling []  [x]      Total number of new medications upon discharge: ____1______  Time:  Time spent preparing for discharge counseling: 0 minutes  Time spent counseling patient: 0 minutes Additional time spent on discharge (specify): 10 spent analyzing discharge medication list and writing progress note.   PLAN:  Consider the following at discharge/Recommendations discussed with provider - Decrease dose of Aspirin to 81 mg daily

## 2014-08-27 NOTE — Progress Notes (Signed)
Hand off report gave to buffa at [facility], pt is ready to discharge.

## 2014-08-28 DIAGNOSIS — E039 Hypothyroidism, unspecified: Secondary | ICD-10-CM | POA: Diagnosis not present

## 2014-08-28 DIAGNOSIS — J449 Chronic obstructive pulmonary disease, unspecified: Secondary | ICD-10-CM | POA: Diagnosis not present

## 2014-08-28 DIAGNOSIS — I10 Essential (primary) hypertension: Secondary | ICD-10-CM | POA: Diagnosis not present

## 2014-08-28 DIAGNOSIS — M810 Age-related osteoporosis without current pathological fracture: Secondary | ICD-10-CM | POA: Diagnosis not present

## 2014-08-28 DIAGNOSIS — E785 Hyperlipidemia, unspecified: Secondary | ICD-10-CM | POA: Diagnosis not present

## 2014-08-28 DIAGNOSIS — K219 Gastro-esophageal reflux disease without esophagitis: Secondary | ICD-10-CM | POA: Diagnosis not present

## 2014-09-10 DIAGNOSIS — Z471 Aftercare following joint replacement surgery: Secondary | ICD-10-CM | POA: Diagnosis not present

## 2014-09-10 DIAGNOSIS — Z9889 Other specified postprocedural states: Secondary | ICD-10-CM | POA: Diagnosis not present

## 2014-09-10 DIAGNOSIS — Z96612 Presence of left artificial shoulder joint: Secondary | ICD-10-CM | POA: Diagnosis not present

## 2014-09-12 DIAGNOSIS — Z96612 Presence of left artificial shoulder joint: Secondary | ICD-10-CM | POA: Diagnosis not present

## 2014-09-12 DIAGNOSIS — L03114 Cellulitis of left upper limb: Secondary | ICD-10-CM | POA: Diagnosis not present

## 2014-09-26 DIAGNOSIS — Z96612 Presence of left artificial shoulder joint: Secondary | ICD-10-CM | POA: Diagnosis not present

## 2014-09-26 DIAGNOSIS — L03114 Cellulitis of left upper limb: Secondary | ICD-10-CM | POA: Diagnosis not present

## 2014-10-03 DIAGNOSIS — Z96612 Presence of left artificial shoulder joint: Secondary | ICD-10-CM | POA: Diagnosis not present

## 2014-10-03 DIAGNOSIS — Z471 Aftercare following joint replacement surgery: Secondary | ICD-10-CM | POA: Diagnosis not present

## 2014-10-09 DIAGNOSIS — K219 Gastro-esophageal reflux disease without esophagitis: Secondary | ICD-10-CM | POA: Diagnosis not present

## 2014-10-09 DIAGNOSIS — F329 Major depressive disorder, single episode, unspecified: Secondary | ICD-10-CM | POA: Diagnosis not present

## 2014-10-09 DIAGNOSIS — Z96612 Presence of left artificial shoulder joint: Secondary | ICD-10-CM | POA: Diagnosis not present

## 2014-10-09 DIAGNOSIS — R489 Unspecified symbolic dysfunctions: Secondary | ICD-10-CM | POA: Diagnosis not present

## 2014-10-09 DIAGNOSIS — E039 Hypothyroidism, unspecified: Secondary | ICD-10-CM | POA: Diagnosis not present

## 2014-10-09 DIAGNOSIS — M129 Arthropathy, unspecified: Secondary | ICD-10-CM | POA: Diagnosis not present

## 2014-10-09 DIAGNOSIS — I2510 Atherosclerotic heart disease of native coronary artery without angina pectoris: Secondary | ICD-10-CM | POA: Diagnosis not present

## 2014-10-09 DIAGNOSIS — M4806 Spinal stenosis, lumbar region: Secondary | ICD-10-CM | POA: Diagnosis not present

## 2014-10-09 DIAGNOSIS — Z9181 History of falling: Secondary | ICD-10-CM | POA: Diagnosis not present

## 2014-10-09 DIAGNOSIS — I10 Essential (primary) hypertension: Secondary | ICD-10-CM | POA: Diagnosis not present

## 2014-10-09 DIAGNOSIS — S42202D Unspecified fracture of upper end of left humerus, subsequent encounter for fracture with routine healing: Secondary | ICD-10-CM | POA: Diagnosis not present

## 2014-10-09 DIAGNOSIS — L03114 Cellulitis of left upper limb: Secondary | ICD-10-CM | POA: Diagnosis not present

## 2014-10-12 DIAGNOSIS — I10 Essential (primary) hypertension: Secondary | ICD-10-CM | POA: Diagnosis not present

## 2014-10-12 DIAGNOSIS — F329 Major depressive disorder, single episode, unspecified: Secondary | ICD-10-CM | POA: Diagnosis not present

## 2014-10-12 DIAGNOSIS — K219 Gastro-esophageal reflux disease without esophagitis: Secondary | ICD-10-CM | POA: Diagnosis not present

## 2014-10-12 DIAGNOSIS — S42202D Unspecified fracture of upper end of left humerus, subsequent encounter for fracture with routine healing: Secondary | ICD-10-CM | POA: Diagnosis not present

## 2014-10-12 DIAGNOSIS — E039 Hypothyroidism, unspecified: Secondary | ICD-10-CM | POA: Diagnosis not present

## 2014-10-12 DIAGNOSIS — M4806 Spinal stenosis, lumbar region: Secondary | ICD-10-CM | POA: Diagnosis not present

## 2014-10-12 DIAGNOSIS — I2510 Atherosclerotic heart disease of native coronary artery without angina pectoris: Secondary | ICD-10-CM | POA: Diagnosis not present

## 2014-10-12 DIAGNOSIS — R489 Unspecified symbolic dysfunctions: Secondary | ICD-10-CM | POA: Diagnosis not present

## 2014-10-12 DIAGNOSIS — M129 Arthropathy, unspecified: Secondary | ICD-10-CM | POA: Diagnosis not present

## 2014-10-12 DIAGNOSIS — Z9181 History of falling: Secondary | ICD-10-CM | POA: Diagnosis not present

## 2014-10-15 DIAGNOSIS — M4806 Spinal stenosis, lumbar region: Secondary | ICD-10-CM | POA: Diagnosis not present

## 2014-10-15 DIAGNOSIS — K219 Gastro-esophageal reflux disease without esophagitis: Secondary | ICD-10-CM | POA: Diagnosis not present

## 2014-10-15 DIAGNOSIS — S42202D Unspecified fracture of upper end of left humerus, subsequent encounter for fracture with routine healing: Secondary | ICD-10-CM | POA: Diagnosis not present

## 2014-10-15 DIAGNOSIS — I2510 Atherosclerotic heart disease of native coronary artery without angina pectoris: Secondary | ICD-10-CM | POA: Diagnosis not present

## 2014-10-15 DIAGNOSIS — I10 Essential (primary) hypertension: Secondary | ICD-10-CM | POA: Diagnosis not present

## 2014-10-15 DIAGNOSIS — M129 Arthropathy, unspecified: Secondary | ICD-10-CM | POA: Diagnosis not present

## 2014-10-15 DIAGNOSIS — Z9181 History of falling: Secondary | ICD-10-CM | POA: Diagnosis not present

## 2014-10-15 DIAGNOSIS — F329 Major depressive disorder, single episode, unspecified: Secondary | ICD-10-CM | POA: Diagnosis not present

## 2014-10-15 DIAGNOSIS — R489 Unspecified symbolic dysfunctions: Secondary | ICD-10-CM | POA: Diagnosis not present

## 2014-10-15 DIAGNOSIS — E039 Hypothyroidism, unspecified: Secondary | ICD-10-CM | POA: Diagnosis not present

## 2014-10-18 DIAGNOSIS — E039 Hypothyroidism, unspecified: Secondary | ICD-10-CM | POA: Diagnosis not present

## 2014-10-18 DIAGNOSIS — M129 Arthropathy, unspecified: Secondary | ICD-10-CM | POA: Diagnosis not present

## 2014-10-18 DIAGNOSIS — R489 Unspecified symbolic dysfunctions: Secondary | ICD-10-CM | POA: Diagnosis not present

## 2014-10-18 DIAGNOSIS — Z9181 History of falling: Secondary | ICD-10-CM | POA: Diagnosis not present

## 2014-10-18 DIAGNOSIS — S42202D Unspecified fracture of upper end of left humerus, subsequent encounter for fracture with routine healing: Secondary | ICD-10-CM | POA: Diagnosis not present

## 2014-10-18 DIAGNOSIS — F329 Major depressive disorder, single episode, unspecified: Secondary | ICD-10-CM | POA: Diagnosis not present

## 2014-10-18 DIAGNOSIS — M4806 Spinal stenosis, lumbar region: Secondary | ICD-10-CM | POA: Diagnosis not present

## 2014-10-18 DIAGNOSIS — K219 Gastro-esophageal reflux disease without esophagitis: Secondary | ICD-10-CM | POA: Diagnosis not present

## 2014-10-18 DIAGNOSIS — I2510 Atherosclerotic heart disease of native coronary artery without angina pectoris: Secondary | ICD-10-CM | POA: Diagnosis not present

## 2014-10-18 DIAGNOSIS — I10 Essential (primary) hypertension: Secondary | ICD-10-CM | POA: Diagnosis not present

## 2014-10-19 DIAGNOSIS — K219 Gastro-esophageal reflux disease without esophagitis: Secondary | ICD-10-CM | POA: Diagnosis not present

## 2014-10-19 DIAGNOSIS — I10 Essential (primary) hypertension: Secondary | ICD-10-CM | POA: Diagnosis not present

## 2014-10-19 DIAGNOSIS — R489 Unspecified symbolic dysfunctions: Secondary | ICD-10-CM | POA: Diagnosis not present

## 2014-10-19 DIAGNOSIS — S42202D Unspecified fracture of upper end of left humerus, subsequent encounter for fracture with routine healing: Secondary | ICD-10-CM | POA: Diagnosis not present

## 2014-10-19 DIAGNOSIS — F329 Major depressive disorder, single episode, unspecified: Secondary | ICD-10-CM | POA: Diagnosis not present

## 2014-10-19 DIAGNOSIS — E039 Hypothyroidism, unspecified: Secondary | ICD-10-CM | POA: Diagnosis not present

## 2014-10-19 DIAGNOSIS — M4806 Spinal stenosis, lumbar region: Secondary | ICD-10-CM | POA: Diagnosis not present

## 2014-10-19 DIAGNOSIS — M129 Arthropathy, unspecified: Secondary | ICD-10-CM | POA: Diagnosis not present

## 2014-10-19 DIAGNOSIS — Z9181 History of falling: Secondary | ICD-10-CM | POA: Diagnosis not present

## 2014-10-19 DIAGNOSIS — I2510 Atherosclerotic heart disease of native coronary artery without angina pectoris: Secondary | ICD-10-CM | POA: Diagnosis not present

## 2014-10-22 DIAGNOSIS — K219 Gastro-esophageal reflux disease without esophagitis: Secondary | ICD-10-CM | POA: Diagnosis not present

## 2014-10-22 DIAGNOSIS — S42202D Unspecified fracture of upper end of left humerus, subsequent encounter for fracture with routine healing: Secondary | ICD-10-CM | POA: Diagnosis not present

## 2014-10-22 DIAGNOSIS — Z9181 History of falling: Secondary | ICD-10-CM | POA: Diagnosis not present

## 2014-10-22 DIAGNOSIS — F329 Major depressive disorder, single episode, unspecified: Secondary | ICD-10-CM | POA: Diagnosis not present

## 2014-10-22 DIAGNOSIS — I2510 Atherosclerotic heart disease of native coronary artery without angina pectoris: Secondary | ICD-10-CM | POA: Diagnosis not present

## 2014-10-22 DIAGNOSIS — E039 Hypothyroidism, unspecified: Secondary | ICD-10-CM | POA: Diagnosis not present

## 2014-10-22 DIAGNOSIS — M4806 Spinal stenosis, lumbar region: Secondary | ICD-10-CM | POA: Diagnosis not present

## 2014-10-22 DIAGNOSIS — R489 Unspecified symbolic dysfunctions: Secondary | ICD-10-CM | POA: Diagnosis not present

## 2014-10-22 DIAGNOSIS — I10 Essential (primary) hypertension: Secondary | ICD-10-CM | POA: Diagnosis not present

## 2014-10-24 DIAGNOSIS — I10 Essential (primary) hypertension: Secondary | ICD-10-CM | POA: Diagnosis not present

## 2014-10-24 DIAGNOSIS — M4806 Spinal stenosis, lumbar region: Secondary | ICD-10-CM | POA: Diagnosis not present

## 2014-10-24 DIAGNOSIS — S42202D Unspecified fracture of upper end of left humerus, subsequent encounter for fracture with routine healing: Secondary | ICD-10-CM | POA: Diagnosis not present

## 2014-10-24 DIAGNOSIS — Z9181 History of falling: Secondary | ICD-10-CM | POA: Diagnosis not present

## 2014-10-24 DIAGNOSIS — K219 Gastro-esophageal reflux disease without esophagitis: Secondary | ICD-10-CM | POA: Diagnosis not present

## 2014-10-24 DIAGNOSIS — I2510 Atherosclerotic heart disease of native coronary artery without angina pectoris: Secondary | ICD-10-CM | POA: Diagnosis not present

## 2014-10-24 DIAGNOSIS — E039 Hypothyroidism, unspecified: Secondary | ICD-10-CM | POA: Diagnosis not present

## 2014-10-24 DIAGNOSIS — R489 Unspecified symbolic dysfunctions: Secondary | ICD-10-CM | POA: Diagnosis not present

## 2014-10-24 DIAGNOSIS — F329 Major depressive disorder, single episode, unspecified: Secondary | ICD-10-CM | POA: Diagnosis not present

## 2014-10-26 DIAGNOSIS — F329 Major depressive disorder, single episode, unspecified: Secondary | ICD-10-CM | POA: Diagnosis not present

## 2014-10-26 DIAGNOSIS — I10 Essential (primary) hypertension: Secondary | ICD-10-CM | POA: Diagnosis not present

## 2014-10-26 DIAGNOSIS — Z9181 History of falling: Secondary | ICD-10-CM | POA: Diagnosis not present

## 2014-10-26 DIAGNOSIS — E039 Hypothyroidism, unspecified: Secondary | ICD-10-CM | POA: Diagnosis not present

## 2014-10-26 DIAGNOSIS — S42202D Unspecified fracture of upper end of left humerus, subsequent encounter for fracture with routine healing: Secondary | ICD-10-CM | POA: Diagnosis not present

## 2014-10-26 DIAGNOSIS — R489 Unspecified symbolic dysfunctions: Secondary | ICD-10-CM | POA: Diagnosis not present

## 2014-10-26 DIAGNOSIS — K219 Gastro-esophageal reflux disease without esophagitis: Secondary | ICD-10-CM | POA: Diagnosis not present

## 2014-10-26 DIAGNOSIS — M4806 Spinal stenosis, lumbar region: Secondary | ICD-10-CM | POA: Diagnosis not present

## 2014-10-26 DIAGNOSIS — I2510 Atherosclerotic heart disease of native coronary artery without angina pectoris: Secondary | ICD-10-CM | POA: Diagnosis not present

## 2014-10-29 DIAGNOSIS — I10 Essential (primary) hypertension: Secondary | ICD-10-CM | POA: Diagnosis not present

## 2014-10-29 DIAGNOSIS — K219 Gastro-esophageal reflux disease without esophagitis: Secondary | ICD-10-CM | POA: Diagnosis not present

## 2014-10-29 DIAGNOSIS — S42202D Unspecified fracture of upper end of left humerus, subsequent encounter for fracture with routine healing: Secondary | ICD-10-CM | POA: Diagnosis not present

## 2014-10-29 DIAGNOSIS — F329 Major depressive disorder, single episode, unspecified: Secondary | ICD-10-CM | POA: Diagnosis not present

## 2014-10-29 DIAGNOSIS — I2510 Atherosclerotic heart disease of native coronary artery without angina pectoris: Secondary | ICD-10-CM | POA: Diagnosis not present

## 2014-10-29 DIAGNOSIS — M4806 Spinal stenosis, lumbar region: Secondary | ICD-10-CM | POA: Diagnosis not present

## 2014-10-29 DIAGNOSIS — E039 Hypothyroidism, unspecified: Secondary | ICD-10-CM | POA: Diagnosis not present

## 2014-10-29 DIAGNOSIS — R489 Unspecified symbolic dysfunctions: Secondary | ICD-10-CM | POA: Diagnosis not present

## 2014-10-29 DIAGNOSIS — Z9181 History of falling: Secondary | ICD-10-CM | POA: Diagnosis not present

## 2014-10-31 DIAGNOSIS — I2510 Atherosclerotic heart disease of native coronary artery without angina pectoris: Secondary | ICD-10-CM | POA: Diagnosis not present

## 2014-10-31 DIAGNOSIS — R489 Unspecified symbolic dysfunctions: Secondary | ICD-10-CM | POA: Diagnosis not present

## 2014-10-31 DIAGNOSIS — S42202D Unspecified fracture of upper end of left humerus, subsequent encounter for fracture with routine healing: Secondary | ICD-10-CM | POA: Diagnosis not present

## 2014-10-31 DIAGNOSIS — Z9181 History of falling: Secondary | ICD-10-CM | POA: Diagnosis not present

## 2014-10-31 DIAGNOSIS — I10 Essential (primary) hypertension: Secondary | ICD-10-CM | POA: Diagnosis not present

## 2014-10-31 DIAGNOSIS — E039 Hypothyroidism, unspecified: Secondary | ICD-10-CM | POA: Diagnosis not present

## 2014-10-31 DIAGNOSIS — F329 Major depressive disorder, single episode, unspecified: Secondary | ICD-10-CM | POA: Diagnosis not present

## 2014-10-31 DIAGNOSIS — K219 Gastro-esophageal reflux disease without esophagitis: Secondary | ICD-10-CM | POA: Diagnosis not present

## 2014-10-31 DIAGNOSIS — M4806 Spinal stenosis, lumbar region: Secondary | ICD-10-CM | POA: Diagnosis not present

## 2014-11-02 DIAGNOSIS — F329 Major depressive disorder, single episode, unspecified: Secondary | ICD-10-CM | POA: Diagnosis not present

## 2014-11-02 DIAGNOSIS — I10 Essential (primary) hypertension: Secondary | ICD-10-CM | POA: Diagnosis not present

## 2014-11-02 DIAGNOSIS — K219 Gastro-esophageal reflux disease without esophagitis: Secondary | ICD-10-CM | POA: Diagnosis not present

## 2014-11-02 DIAGNOSIS — M4806 Spinal stenosis, lumbar region: Secondary | ICD-10-CM | POA: Diagnosis not present

## 2014-11-02 DIAGNOSIS — R489 Unspecified symbolic dysfunctions: Secondary | ICD-10-CM | POA: Diagnosis not present

## 2014-11-02 DIAGNOSIS — Z9181 History of falling: Secondary | ICD-10-CM | POA: Diagnosis not present

## 2014-11-02 DIAGNOSIS — I2510 Atherosclerotic heart disease of native coronary artery without angina pectoris: Secondary | ICD-10-CM | POA: Diagnosis not present

## 2014-11-02 DIAGNOSIS — S42202D Unspecified fracture of upper end of left humerus, subsequent encounter for fracture with routine healing: Secondary | ICD-10-CM | POA: Diagnosis not present

## 2014-11-02 DIAGNOSIS — E039 Hypothyroidism, unspecified: Secondary | ICD-10-CM | POA: Diagnosis not present

## 2014-11-05 DIAGNOSIS — F329 Major depressive disorder, single episode, unspecified: Secondary | ICD-10-CM | POA: Diagnosis not present

## 2014-11-05 DIAGNOSIS — S42202D Unspecified fracture of upper end of left humerus, subsequent encounter for fracture with routine healing: Secondary | ICD-10-CM | POA: Diagnosis not present

## 2014-11-05 DIAGNOSIS — Z9181 History of falling: Secondary | ICD-10-CM | POA: Diagnosis not present

## 2014-11-05 DIAGNOSIS — E039 Hypothyroidism, unspecified: Secondary | ICD-10-CM | POA: Diagnosis not present

## 2014-11-05 DIAGNOSIS — I2510 Atherosclerotic heart disease of native coronary artery without angina pectoris: Secondary | ICD-10-CM | POA: Diagnosis not present

## 2014-11-05 DIAGNOSIS — R489 Unspecified symbolic dysfunctions: Secondary | ICD-10-CM | POA: Diagnosis not present

## 2014-11-05 DIAGNOSIS — M4806 Spinal stenosis, lumbar region: Secondary | ICD-10-CM | POA: Diagnosis not present

## 2014-11-05 DIAGNOSIS — I10 Essential (primary) hypertension: Secondary | ICD-10-CM | POA: Diagnosis not present

## 2014-11-05 DIAGNOSIS — K219 Gastro-esophageal reflux disease without esophagitis: Secondary | ICD-10-CM | POA: Diagnosis not present

## 2014-11-07 DIAGNOSIS — S80211A Abrasion, right knee, initial encounter: Secondary | ICD-10-CM | POA: Diagnosis not present

## 2014-11-08 DIAGNOSIS — E039 Hypothyroidism, unspecified: Secondary | ICD-10-CM | POA: Diagnosis not present

## 2014-11-08 DIAGNOSIS — I2510 Atherosclerotic heart disease of native coronary artery without angina pectoris: Secondary | ICD-10-CM | POA: Diagnosis not present

## 2014-11-08 DIAGNOSIS — F329 Major depressive disorder, single episode, unspecified: Secondary | ICD-10-CM | POA: Diagnosis not present

## 2014-11-08 DIAGNOSIS — K219 Gastro-esophageal reflux disease without esophagitis: Secondary | ICD-10-CM | POA: Diagnosis not present

## 2014-11-08 DIAGNOSIS — M4806 Spinal stenosis, lumbar region: Secondary | ICD-10-CM | POA: Diagnosis not present

## 2014-11-08 DIAGNOSIS — R489 Unspecified symbolic dysfunctions: Secondary | ICD-10-CM | POA: Diagnosis not present

## 2014-11-08 DIAGNOSIS — Z9181 History of falling: Secondary | ICD-10-CM | POA: Diagnosis not present

## 2014-11-08 DIAGNOSIS — S42202D Unspecified fracture of upper end of left humerus, subsequent encounter for fracture with routine healing: Secondary | ICD-10-CM | POA: Diagnosis not present

## 2014-11-08 DIAGNOSIS — I10 Essential (primary) hypertension: Secondary | ICD-10-CM | POA: Diagnosis not present

## 2014-11-09 DIAGNOSIS — K219 Gastro-esophageal reflux disease without esophagitis: Secondary | ICD-10-CM | POA: Diagnosis not present

## 2014-11-09 DIAGNOSIS — S42202D Unspecified fracture of upper end of left humerus, subsequent encounter for fracture with routine healing: Secondary | ICD-10-CM | POA: Diagnosis not present

## 2014-11-09 DIAGNOSIS — R489 Unspecified symbolic dysfunctions: Secondary | ICD-10-CM | POA: Diagnosis not present

## 2014-11-09 DIAGNOSIS — I10 Essential (primary) hypertension: Secondary | ICD-10-CM | POA: Diagnosis not present

## 2014-11-09 DIAGNOSIS — M4806 Spinal stenosis, lumbar region: Secondary | ICD-10-CM | POA: Diagnosis not present

## 2014-11-09 DIAGNOSIS — E039 Hypothyroidism, unspecified: Secondary | ICD-10-CM | POA: Diagnosis not present

## 2014-11-09 DIAGNOSIS — I2510 Atherosclerotic heart disease of native coronary artery without angina pectoris: Secondary | ICD-10-CM | POA: Diagnosis not present

## 2014-11-09 DIAGNOSIS — Z9181 History of falling: Secondary | ICD-10-CM | POA: Diagnosis not present

## 2014-11-09 DIAGNOSIS — F329 Major depressive disorder, single episode, unspecified: Secondary | ICD-10-CM | POA: Diagnosis not present

## 2014-11-12 DIAGNOSIS — M4806 Spinal stenosis, lumbar region: Secondary | ICD-10-CM | POA: Diagnosis not present

## 2014-11-12 DIAGNOSIS — S42202D Unspecified fracture of upper end of left humerus, subsequent encounter for fracture with routine healing: Secondary | ICD-10-CM | POA: Diagnosis not present

## 2014-11-12 DIAGNOSIS — I2510 Atherosclerotic heart disease of native coronary artery without angina pectoris: Secondary | ICD-10-CM | POA: Diagnosis not present

## 2014-11-12 DIAGNOSIS — E039 Hypothyroidism, unspecified: Secondary | ICD-10-CM | POA: Diagnosis not present

## 2014-11-12 DIAGNOSIS — I10 Essential (primary) hypertension: Secondary | ICD-10-CM | POA: Diagnosis not present

## 2014-11-12 DIAGNOSIS — K219 Gastro-esophageal reflux disease without esophagitis: Secondary | ICD-10-CM | POA: Diagnosis not present

## 2014-11-12 DIAGNOSIS — R489 Unspecified symbolic dysfunctions: Secondary | ICD-10-CM | POA: Diagnosis not present

## 2014-11-12 DIAGNOSIS — F329 Major depressive disorder, single episode, unspecified: Secondary | ICD-10-CM | POA: Diagnosis not present

## 2014-11-12 DIAGNOSIS — Z9181 History of falling: Secondary | ICD-10-CM | POA: Diagnosis not present

## 2014-11-13 DIAGNOSIS — E039 Hypothyroidism, unspecified: Secondary | ICD-10-CM | POA: Diagnosis not present

## 2014-11-13 DIAGNOSIS — K219 Gastro-esophageal reflux disease without esophagitis: Secondary | ICD-10-CM | POA: Diagnosis not present

## 2014-11-13 DIAGNOSIS — R489 Unspecified symbolic dysfunctions: Secondary | ICD-10-CM | POA: Diagnosis not present

## 2014-11-13 DIAGNOSIS — F329 Major depressive disorder, single episode, unspecified: Secondary | ICD-10-CM | POA: Diagnosis not present

## 2014-11-13 DIAGNOSIS — I2510 Atherosclerotic heart disease of native coronary artery without angina pectoris: Secondary | ICD-10-CM | POA: Diagnosis not present

## 2014-11-13 DIAGNOSIS — I10 Essential (primary) hypertension: Secondary | ICD-10-CM | POA: Diagnosis not present

## 2014-11-13 DIAGNOSIS — M4806 Spinal stenosis, lumbar region: Secondary | ICD-10-CM | POA: Diagnosis not present

## 2014-11-13 DIAGNOSIS — S42202D Unspecified fracture of upper end of left humerus, subsequent encounter for fracture with routine healing: Secondary | ICD-10-CM | POA: Diagnosis not present

## 2014-11-13 DIAGNOSIS — Z9181 History of falling: Secondary | ICD-10-CM | POA: Diagnosis not present

## 2014-11-14 DIAGNOSIS — Z471 Aftercare following joint replacement surgery: Secondary | ICD-10-CM | POA: Diagnosis not present

## 2014-11-14 DIAGNOSIS — Z96612 Presence of left artificial shoulder joint: Secondary | ICD-10-CM | POA: Diagnosis not present

## 2014-11-15 DIAGNOSIS — M4806 Spinal stenosis, lumbar region: Secondary | ICD-10-CM | POA: Diagnosis not present

## 2014-11-15 DIAGNOSIS — R489 Unspecified symbolic dysfunctions: Secondary | ICD-10-CM | POA: Diagnosis not present

## 2014-11-15 DIAGNOSIS — K219 Gastro-esophageal reflux disease without esophagitis: Secondary | ICD-10-CM | POA: Diagnosis not present

## 2014-11-15 DIAGNOSIS — Z9181 History of falling: Secondary | ICD-10-CM | POA: Diagnosis not present

## 2014-11-15 DIAGNOSIS — I2510 Atherosclerotic heart disease of native coronary artery without angina pectoris: Secondary | ICD-10-CM | POA: Diagnosis not present

## 2014-11-15 DIAGNOSIS — S42202D Unspecified fracture of upper end of left humerus, subsequent encounter for fracture with routine healing: Secondary | ICD-10-CM | POA: Diagnosis not present

## 2014-11-15 DIAGNOSIS — F329 Major depressive disorder, single episode, unspecified: Secondary | ICD-10-CM | POA: Diagnosis not present

## 2014-11-15 DIAGNOSIS — E039 Hypothyroidism, unspecified: Secondary | ICD-10-CM | POA: Diagnosis not present

## 2014-11-15 DIAGNOSIS — I10 Essential (primary) hypertension: Secondary | ICD-10-CM | POA: Diagnosis not present

## 2014-11-19 DIAGNOSIS — S42202D Unspecified fracture of upper end of left humerus, subsequent encounter for fracture with routine healing: Secondary | ICD-10-CM | POA: Diagnosis not present

## 2014-11-19 DIAGNOSIS — Z9181 History of falling: Secondary | ICD-10-CM | POA: Diagnosis not present

## 2014-11-19 DIAGNOSIS — M4806 Spinal stenosis, lumbar region: Secondary | ICD-10-CM | POA: Diagnosis not present

## 2014-11-19 DIAGNOSIS — E039 Hypothyroidism, unspecified: Secondary | ICD-10-CM | POA: Diagnosis not present

## 2014-11-19 DIAGNOSIS — K219 Gastro-esophageal reflux disease without esophagitis: Secondary | ICD-10-CM | POA: Diagnosis not present

## 2014-11-19 DIAGNOSIS — I10 Essential (primary) hypertension: Secondary | ICD-10-CM | POA: Diagnosis not present

## 2014-11-19 DIAGNOSIS — I2510 Atherosclerotic heart disease of native coronary artery without angina pectoris: Secondary | ICD-10-CM | POA: Diagnosis not present

## 2014-11-19 DIAGNOSIS — R489 Unspecified symbolic dysfunctions: Secondary | ICD-10-CM | POA: Diagnosis not present

## 2014-11-19 DIAGNOSIS — F329 Major depressive disorder, single episode, unspecified: Secondary | ICD-10-CM | POA: Diagnosis not present

## 2014-11-20 DIAGNOSIS — Z9181 History of falling: Secondary | ICD-10-CM | POA: Diagnosis not present

## 2014-11-20 DIAGNOSIS — F329 Major depressive disorder, single episode, unspecified: Secondary | ICD-10-CM | POA: Diagnosis not present

## 2014-11-20 DIAGNOSIS — K219 Gastro-esophageal reflux disease without esophagitis: Secondary | ICD-10-CM | POA: Diagnosis not present

## 2014-11-20 DIAGNOSIS — M4806 Spinal stenosis, lumbar region: Secondary | ICD-10-CM | POA: Diagnosis not present

## 2014-11-20 DIAGNOSIS — I10 Essential (primary) hypertension: Secondary | ICD-10-CM | POA: Diagnosis not present

## 2014-11-20 DIAGNOSIS — E039 Hypothyroidism, unspecified: Secondary | ICD-10-CM | POA: Diagnosis not present

## 2014-11-20 DIAGNOSIS — I2510 Atherosclerotic heart disease of native coronary artery without angina pectoris: Secondary | ICD-10-CM | POA: Diagnosis not present

## 2014-11-20 DIAGNOSIS — R489 Unspecified symbolic dysfunctions: Secondary | ICD-10-CM | POA: Diagnosis not present

## 2014-11-20 DIAGNOSIS — S42202D Unspecified fracture of upper end of left humerus, subsequent encounter for fracture with routine healing: Secondary | ICD-10-CM | POA: Diagnosis not present

## 2014-11-22 DIAGNOSIS — I10 Essential (primary) hypertension: Secondary | ICD-10-CM | POA: Diagnosis not present

## 2014-11-22 DIAGNOSIS — R489 Unspecified symbolic dysfunctions: Secondary | ICD-10-CM | POA: Diagnosis not present

## 2014-11-22 DIAGNOSIS — F329 Major depressive disorder, single episode, unspecified: Secondary | ICD-10-CM | POA: Diagnosis not present

## 2014-11-22 DIAGNOSIS — Z9181 History of falling: Secondary | ICD-10-CM | POA: Diagnosis not present

## 2014-11-22 DIAGNOSIS — I2510 Atherosclerotic heart disease of native coronary artery without angina pectoris: Secondary | ICD-10-CM | POA: Diagnosis not present

## 2014-11-22 DIAGNOSIS — S42202D Unspecified fracture of upper end of left humerus, subsequent encounter for fracture with routine healing: Secondary | ICD-10-CM | POA: Diagnosis not present

## 2014-11-22 DIAGNOSIS — E039 Hypothyroidism, unspecified: Secondary | ICD-10-CM | POA: Diagnosis not present

## 2014-11-22 DIAGNOSIS — M4806 Spinal stenosis, lumbar region: Secondary | ICD-10-CM | POA: Diagnosis not present

## 2014-11-22 DIAGNOSIS — K219 Gastro-esophageal reflux disease without esophagitis: Secondary | ICD-10-CM | POA: Diagnosis not present

## 2014-11-23 DIAGNOSIS — R489 Unspecified symbolic dysfunctions: Secondary | ICD-10-CM | POA: Diagnosis not present

## 2014-11-23 DIAGNOSIS — I2510 Atherosclerotic heart disease of native coronary artery without angina pectoris: Secondary | ICD-10-CM | POA: Diagnosis not present

## 2014-11-23 DIAGNOSIS — S42202D Unspecified fracture of upper end of left humerus, subsequent encounter for fracture with routine healing: Secondary | ICD-10-CM | POA: Diagnosis not present

## 2014-11-23 DIAGNOSIS — Z9181 History of falling: Secondary | ICD-10-CM | POA: Diagnosis not present

## 2014-11-23 DIAGNOSIS — K219 Gastro-esophageal reflux disease without esophagitis: Secondary | ICD-10-CM | POA: Diagnosis not present

## 2014-11-23 DIAGNOSIS — F329 Major depressive disorder, single episode, unspecified: Secondary | ICD-10-CM | POA: Diagnosis not present

## 2014-11-23 DIAGNOSIS — E039 Hypothyroidism, unspecified: Secondary | ICD-10-CM | POA: Diagnosis not present

## 2014-11-23 DIAGNOSIS — M4806 Spinal stenosis, lumbar region: Secondary | ICD-10-CM | POA: Diagnosis not present

## 2014-11-23 DIAGNOSIS — I10 Essential (primary) hypertension: Secondary | ICD-10-CM | POA: Diagnosis not present

## 2014-11-27 DIAGNOSIS — S42202D Unspecified fracture of upper end of left humerus, subsequent encounter for fracture with routine healing: Secondary | ICD-10-CM | POA: Diagnosis not present

## 2014-11-27 DIAGNOSIS — K219 Gastro-esophageal reflux disease without esophagitis: Secondary | ICD-10-CM | POA: Diagnosis not present

## 2014-11-27 DIAGNOSIS — I2510 Atherosclerotic heart disease of native coronary artery without angina pectoris: Secondary | ICD-10-CM | POA: Diagnosis not present

## 2014-11-27 DIAGNOSIS — R489 Unspecified symbolic dysfunctions: Secondary | ICD-10-CM | POA: Diagnosis not present

## 2014-11-27 DIAGNOSIS — M4806 Spinal stenosis, lumbar region: Secondary | ICD-10-CM | POA: Diagnosis not present

## 2014-11-27 DIAGNOSIS — Z9181 History of falling: Secondary | ICD-10-CM | POA: Diagnosis not present

## 2014-11-27 DIAGNOSIS — F329 Major depressive disorder, single episode, unspecified: Secondary | ICD-10-CM | POA: Diagnosis not present

## 2014-11-27 DIAGNOSIS — I10 Essential (primary) hypertension: Secondary | ICD-10-CM | POA: Diagnosis not present

## 2014-11-27 DIAGNOSIS — E039 Hypothyroidism, unspecified: Secondary | ICD-10-CM | POA: Diagnosis not present

## 2014-11-29 DIAGNOSIS — Z9181 History of falling: Secondary | ICD-10-CM | POA: Diagnosis not present

## 2014-11-29 DIAGNOSIS — K219 Gastro-esophageal reflux disease without esophagitis: Secondary | ICD-10-CM | POA: Diagnosis not present

## 2014-11-29 DIAGNOSIS — R489 Unspecified symbolic dysfunctions: Secondary | ICD-10-CM | POA: Diagnosis not present

## 2014-11-29 DIAGNOSIS — S42202D Unspecified fracture of upper end of left humerus, subsequent encounter for fracture with routine healing: Secondary | ICD-10-CM | POA: Diagnosis not present

## 2014-11-29 DIAGNOSIS — E039 Hypothyroidism, unspecified: Secondary | ICD-10-CM | POA: Diagnosis not present

## 2014-11-29 DIAGNOSIS — M4806 Spinal stenosis, lumbar region: Secondary | ICD-10-CM | POA: Diagnosis not present

## 2014-11-29 DIAGNOSIS — I10 Essential (primary) hypertension: Secondary | ICD-10-CM | POA: Diagnosis not present

## 2014-11-29 DIAGNOSIS — F329 Major depressive disorder, single episode, unspecified: Secondary | ICD-10-CM | POA: Diagnosis not present

## 2014-11-29 DIAGNOSIS — I2510 Atherosclerotic heart disease of native coronary artery without angina pectoris: Secondary | ICD-10-CM | POA: Diagnosis not present

## 2014-12-03 DIAGNOSIS — I2510 Atherosclerotic heart disease of native coronary artery without angina pectoris: Secondary | ICD-10-CM | POA: Diagnosis not present

## 2014-12-03 DIAGNOSIS — R489 Unspecified symbolic dysfunctions: Secondary | ICD-10-CM | POA: Diagnosis not present

## 2014-12-03 DIAGNOSIS — E039 Hypothyroidism, unspecified: Secondary | ICD-10-CM | POA: Diagnosis not present

## 2014-12-03 DIAGNOSIS — M4806 Spinal stenosis, lumbar region: Secondary | ICD-10-CM | POA: Diagnosis not present

## 2014-12-03 DIAGNOSIS — S42202D Unspecified fracture of upper end of left humerus, subsequent encounter for fracture with routine healing: Secondary | ICD-10-CM | POA: Diagnosis not present

## 2014-12-03 DIAGNOSIS — I10 Essential (primary) hypertension: Secondary | ICD-10-CM | POA: Diagnosis not present

## 2014-12-03 DIAGNOSIS — F329 Major depressive disorder, single episode, unspecified: Secondary | ICD-10-CM | POA: Diagnosis not present

## 2014-12-03 DIAGNOSIS — K219 Gastro-esophageal reflux disease without esophagitis: Secondary | ICD-10-CM | POA: Diagnosis not present

## 2014-12-03 DIAGNOSIS — Z9181 History of falling: Secondary | ICD-10-CM | POA: Diagnosis not present

## 2014-12-05 DIAGNOSIS — S42202D Unspecified fracture of upper end of left humerus, subsequent encounter for fracture with routine healing: Secondary | ICD-10-CM | POA: Diagnosis not present

## 2014-12-05 DIAGNOSIS — Z9181 History of falling: Secondary | ICD-10-CM | POA: Diagnosis not present

## 2014-12-05 DIAGNOSIS — I2510 Atherosclerotic heart disease of native coronary artery without angina pectoris: Secondary | ICD-10-CM | POA: Diagnosis not present

## 2014-12-05 DIAGNOSIS — K219 Gastro-esophageal reflux disease without esophagitis: Secondary | ICD-10-CM | POA: Diagnosis not present

## 2014-12-05 DIAGNOSIS — F329 Major depressive disorder, single episode, unspecified: Secondary | ICD-10-CM | POA: Diagnosis not present

## 2014-12-05 DIAGNOSIS — I10 Essential (primary) hypertension: Secondary | ICD-10-CM | POA: Diagnosis not present

## 2014-12-05 DIAGNOSIS — R489 Unspecified symbolic dysfunctions: Secondary | ICD-10-CM | POA: Diagnosis not present

## 2014-12-05 DIAGNOSIS — E039 Hypothyroidism, unspecified: Secondary | ICD-10-CM | POA: Diagnosis not present

## 2014-12-05 DIAGNOSIS — M4806 Spinal stenosis, lumbar region: Secondary | ICD-10-CM | POA: Diagnosis not present

## 2014-12-07 DIAGNOSIS — I2510 Atherosclerotic heart disease of native coronary artery without angina pectoris: Secondary | ICD-10-CM | POA: Diagnosis not present

## 2014-12-07 DIAGNOSIS — M4806 Spinal stenosis, lumbar region: Secondary | ICD-10-CM | POA: Diagnosis not present

## 2014-12-07 DIAGNOSIS — I10 Essential (primary) hypertension: Secondary | ICD-10-CM | POA: Diagnosis not present

## 2014-12-07 DIAGNOSIS — E039 Hypothyroidism, unspecified: Secondary | ICD-10-CM | POA: Diagnosis not present

## 2014-12-07 DIAGNOSIS — R489 Unspecified symbolic dysfunctions: Secondary | ICD-10-CM | POA: Diagnosis not present

## 2014-12-07 DIAGNOSIS — F329 Major depressive disorder, single episode, unspecified: Secondary | ICD-10-CM | POA: Diagnosis not present

## 2014-12-07 DIAGNOSIS — Z9181 History of falling: Secondary | ICD-10-CM | POA: Diagnosis not present

## 2014-12-07 DIAGNOSIS — S42202D Unspecified fracture of upper end of left humerus, subsequent encounter for fracture with routine healing: Secondary | ICD-10-CM | POA: Diagnosis not present

## 2014-12-07 DIAGNOSIS — K219 Gastro-esophageal reflux disease without esophagitis: Secondary | ICD-10-CM | POA: Diagnosis not present

## 2014-12-10 DIAGNOSIS — S42202D Unspecified fracture of upper end of left humerus, subsequent encounter for fracture with routine healing: Secondary | ICD-10-CM | POA: Diagnosis not present

## 2014-12-10 DIAGNOSIS — E039 Hypothyroidism, unspecified: Secondary | ICD-10-CM | POA: Diagnosis not present

## 2014-12-10 DIAGNOSIS — Z9181 History of falling: Secondary | ICD-10-CM | POA: Diagnosis not present

## 2014-12-10 DIAGNOSIS — I2510 Atherosclerotic heart disease of native coronary artery without angina pectoris: Secondary | ICD-10-CM | POA: Diagnosis not present

## 2014-12-10 DIAGNOSIS — I10 Essential (primary) hypertension: Secondary | ICD-10-CM | POA: Diagnosis not present

## 2014-12-10 DIAGNOSIS — F329 Major depressive disorder, single episode, unspecified: Secondary | ICD-10-CM | POA: Diagnosis not present

## 2014-12-10 DIAGNOSIS — M4806 Spinal stenosis, lumbar region: Secondary | ICD-10-CM | POA: Diagnosis not present

## 2014-12-10 DIAGNOSIS — R489 Unspecified symbolic dysfunctions: Secondary | ICD-10-CM | POA: Diagnosis not present

## 2014-12-10 DIAGNOSIS — K219 Gastro-esophageal reflux disease without esophagitis: Secondary | ICD-10-CM | POA: Diagnosis not present

## 2014-12-14 DIAGNOSIS — R489 Unspecified symbolic dysfunctions: Secondary | ICD-10-CM | POA: Diagnosis not present

## 2014-12-14 DIAGNOSIS — K219 Gastro-esophageal reflux disease without esophagitis: Secondary | ICD-10-CM | POA: Diagnosis not present

## 2014-12-14 DIAGNOSIS — S42202D Unspecified fracture of upper end of left humerus, subsequent encounter for fracture with routine healing: Secondary | ICD-10-CM | POA: Diagnosis not present

## 2014-12-14 DIAGNOSIS — F329 Major depressive disorder, single episode, unspecified: Secondary | ICD-10-CM | POA: Diagnosis not present

## 2014-12-14 DIAGNOSIS — I2510 Atherosclerotic heart disease of native coronary artery without angina pectoris: Secondary | ICD-10-CM | POA: Diagnosis not present

## 2014-12-14 DIAGNOSIS — M4806 Spinal stenosis, lumbar region: Secondary | ICD-10-CM | POA: Diagnosis not present

## 2014-12-14 DIAGNOSIS — E039 Hypothyroidism, unspecified: Secondary | ICD-10-CM | POA: Diagnosis not present

## 2014-12-14 DIAGNOSIS — I10 Essential (primary) hypertension: Secondary | ICD-10-CM | POA: Diagnosis not present

## 2014-12-14 DIAGNOSIS — Z9181 History of falling: Secondary | ICD-10-CM | POA: Diagnosis not present

## 2014-12-17 DIAGNOSIS — Z9181 History of falling: Secondary | ICD-10-CM | POA: Diagnosis not present

## 2014-12-17 DIAGNOSIS — S42202D Unspecified fracture of upper end of left humerus, subsequent encounter for fracture with routine healing: Secondary | ICD-10-CM | POA: Diagnosis not present

## 2014-12-17 DIAGNOSIS — I2510 Atherosclerotic heart disease of native coronary artery without angina pectoris: Secondary | ICD-10-CM | POA: Diagnosis not present

## 2014-12-17 DIAGNOSIS — I10 Essential (primary) hypertension: Secondary | ICD-10-CM | POA: Diagnosis not present

## 2014-12-17 DIAGNOSIS — R489 Unspecified symbolic dysfunctions: Secondary | ICD-10-CM | POA: Diagnosis not present

## 2014-12-17 DIAGNOSIS — K219 Gastro-esophageal reflux disease without esophagitis: Secondary | ICD-10-CM | POA: Diagnosis not present

## 2014-12-17 DIAGNOSIS — F4323 Adjustment disorder with mixed anxiety and depressed mood: Secondary | ICD-10-CM | POA: Diagnosis not present

## 2014-12-17 DIAGNOSIS — M4806 Spinal stenosis, lumbar region: Secondary | ICD-10-CM | POA: Diagnosis not present

## 2014-12-17 DIAGNOSIS — F329 Major depressive disorder, single episode, unspecified: Secondary | ICD-10-CM | POA: Diagnosis not present

## 2014-12-17 DIAGNOSIS — E039 Hypothyroidism, unspecified: Secondary | ICD-10-CM | POA: Diagnosis not present

## 2014-12-18 DIAGNOSIS — K219 Gastro-esophageal reflux disease without esophagitis: Secondary | ICD-10-CM | POA: Diagnosis not present

## 2014-12-18 DIAGNOSIS — I2510 Atherosclerotic heart disease of native coronary artery without angina pectoris: Secondary | ICD-10-CM | POA: Diagnosis not present

## 2014-12-18 DIAGNOSIS — M4806 Spinal stenosis, lumbar region: Secondary | ICD-10-CM | POA: Diagnosis not present

## 2014-12-18 DIAGNOSIS — Z9181 History of falling: Secondary | ICD-10-CM | POA: Diagnosis not present

## 2014-12-18 DIAGNOSIS — E039 Hypothyroidism, unspecified: Secondary | ICD-10-CM | POA: Diagnosis not present

## 2014-12-18 DIAGNOSIS — F329 Major depressive disorder, single episode, unspecified: Secondary | ICD-10-CM | POA: Diagnosis not present

## 2014-12-18 DIAGNOSIS — R489 Unspecified symbolic dysfunctions: Secondary | ICD-10-CM | POA: Diagnosis not present

## 2014-12-18 DIAGNOSIS — I10 Essential (primary) hypertension: Secondary | ICD-10-CM | POA: Diagnosis not present

## 2014-12-18 DIAGNOSIS — S42202D Unspecified fracture of upper end of left humerus, subsequent encounter for fracture with routine healing: Secondary | ICD-10-CM | POA: Diagnosis not present

## 2014-12-24 DIAGNOSIS — M75102 Unspecified rotator cuff tear or rupture of left shoulder, not specified as traumatic: Secondary | ICD-10-CM | POA: Diagnosis not present

## 2014-12-24 DIAGNOSIS — D649 Anemia, unspecified: Secondary | ICD-10-CM | POA: Diagnosis not present

## 2014-12-24 DIAGNOSIS — E785 Hyperlipidemia, unspecified: Secondary | ICD-10-CM | POA: Diagnosis not present

## 2014-12-24 DIAGNOSIS — K219 Gastro-esophageal reflux disease without esophagitis: Secondary | ICD-10-CM | POA: Diagnosis not present

## 2014-12-24 DIAGNOSIS — F329 Major depressive disorder, single episode, unspecified: Secondary | ICD-10-CM | POA: Diagnosis not present

## 2014-12-24 DIAGNOSIS — I10 Essential (primary) hypertension: Secondary | ICD-10-CM | POA: Diagnosis not present

## 2014-12-24 DIAGNOSIS — E039 Hypothyroidism, unspecified: Secondary | ICD-10-CM | POA: Diagnosis not present

## 2014-12-24 DIAGNOSIS — M810 Age-related osteoporosis without current pathological fracture: Secondary | ICD-10-CM | POA: Diagnosis not present

## 2014-12-24 DIAGNOSIS — M4808 Spinal stenosis, sacral and sacrococcygeal region: Secondary | ICD-10-CM | POA: Diagnosis not present

## 2015-02-13 DIAGNOSIS — M25512 Pain in left shoulder: Secondary | ICD-10-CM | POA: Diagnosis not present

## 2015-02-22 DIAGNOSIS — F329 Major depressive disorder, single episode, unspecified: Secondary | ICD-10-CM | POA: Diagnosis not present

## 2015-02-22 DIAGNOSIS — D649 Anemia, unspecified: Secondary | ICD-10-CM | POA: Diagnosis not present

## 2015-02-22 DIAGNOSIS — K219 Gastro-esophageal reflux disease without esophagitis: Secondary | ICD-10-CM | POA: Diagnosis not present

## 2015-02-22 DIAGNOSIS — M75102 Unspecified rotator cuff tear or rupture of left shoulder, not specified as traumatic: Secondary | ICD-10-CM | POA: Diagnosis not present

## 2015-02-22 DIAGNOSIS — E785 Hyperlipidemia, unspecified: Secondary | ICD-10-CM | POA: Diagnosis not present

## 2015-02-22 DIAGNOSIS — M810 Age-related osteoporosis without current pathological fracture: Secondary | ICD-10-CM | POA: Diagnosis not present

## 2015-02-22 DIAGNOSIS — I10 Essential (primary) hypertension: Secondary | ICD-10-CM | POA: Diagnosis not present

## 2015-02-22 DIAGNOSIS — E039 Hypothyroidism, unspecified: Secondary | ICD-10-CM | POA: Diagnosis not present

## 2015-02-22 DIAGNOSIS — M4808 Spinal stenosis, sacral and sacrococcygeal region: Secondary | ICD-10-CM | POA: Diagnosis not present

## 2015-03-11 DIAGNOSIS — J069 Acute upper respiratory infection, unspecified: Secondary | ICD-10-CM | POA: Diagnosis not present

## 2015-03-14 DIAGNOSIS — R093 Abnormal sputum: Secondary | ICD-10-CM | POA: Diagnosis not present

## 2015-03-28 DIAGNOSIS — R4182 Altered mental status, unspecified: Secondary | ICD-10-CM | POA: Diagnosis not present

## 2015-03-28 DIAGNOSIS — Z5181 Encounter for therapeutic drug level monitoring: Secondary | ICD-10-CM | POA: Diagnosis not present

## 2015-03-28 DIAGNOSIS — R300 Dysuria: Secondary | ICD-10-CM | POA: Diagnosis not present

## 2015-03-28 DIAGNOSIS — E878 Other disorders of electrolyte and fluid balance, not elsewhere classified: Secondary | ICD-10-CM | POA: Diagnosis not present

## 2015-03-28 DIAGNOSIS — E039 Hypothyroidism, unspecified: Secondary | ICD-10-CM | POA: Diagnosis not present

## 2015-05-10 DIAGNOSIS — I10 Essential (primary) hypertension: Secondary | ICD-10-CM | POA: Diagnosis not present

## 2015-05-10 DIAGNOSIS — J45901 Unspecified asthma with (acute) exacerbation: Secondary | ICD-10-CM | POA: Diagnosis not present

## 2015-05-10 DIAGNOSIS — M549 Dorsalgia, unspecified: Secondary | ICD-10-CM | POA: Diagnosis not present

## 2015-05-10 DIAGNOSIS — E784 Other hyperlipidemia: Secondary | ICD-10-CM | POA: Diagnosis not present

## 2015-05-16 DIAGNOSIS — R062 Wheezing: Secondary | ICD-10-CM | POA: Diagnosis not present

## 2015-05-16 DIAGNOSIS — R05 Cough: Secondary | ICD-10-CM | POA: Diagnosis not present

## 2015-06-14 DIAGNOSIS — M6281 Muscle weakness (generalized): Secondary | ICD-10-CM | POA: Diagnosis not present

## 2015-06-14 DIAGNOSIS — R2681 Unsteadiness on feet: Secondary | ICD-10-CM | POA: Diagnosis not present

## 2015-06-17 DIAGNOSIS — M6281 Muscle weakness (generalized): Secondary | ICD-10-CM | POA: Diagnosis not present

## 2015-06-17 DIAGNOSIS — R2681 Unsteadiness on feet: Secondary | ICD-10-CM | POA: Diagnosis not present

## 2015-06-18 DIAGNOSIS — E039 Hypothyroidism, unspecified: Secondary | ICD-10-CM | POA: Diagnosis not present

## 2015-06-20 DIAGNOSIS — M6281 Muscle weakness (generalized): Secondary | ICD-10-CM | POA: Diagnosis not present

## 2015-06-20 DIAGNOSIS — R2681 Unsteadiness on feet: Secondary | ICD-10-CM | POA: Diagnosis not present

## 2015-06-26 DIAGNOSIS — R2681 Unsteadiness on feet: Secondary | ICD-10-CM | POA: Diagnosis not present

## 2015-06-26 DIAGNOSIS — M6281 Muscle weakness (generalized): Secondary | ICD-10-CM | POA: Diagnosis not present

## 2015-06-27 DIAGNOSIS — R2681 Unsteadiness on feet: Secondary | ICD-10-CM | POA: Diagnosis not present

## 2015-06-27 DIAGNOSIS — M6281 Muscle weakness (generalized): Secondary | ICD-10-CM | POA: Diagnosis not present

## 2015-07-02 DIAGNOSIS — R2681 Unsteadiness on feet: Secondary | ICD-10-CM | POA: Diagnosis not present

## 2015-07-02 DIAGNOSIS — M6281 Muscle weakness (generalized): Secondary | ICD-10-CM | POA: Diagnosis not present

## 2015-07-03 DIAGNOSIS — M6281 Muscle weakness (generalized): Secondary | ICD-10-CM | POA: Diagnosis not present

## 2015-07-03 DIAGNOSIS — R2681 Unsteadiness on feet: Secondary | ICD-10-CM | POA: Diagnosis not present

## 2015-07-04 DIAGNOSIS — M6281 Muscle weakness (generalized): Secondary | ICD-10-CM | POA: Diagnosis not present

## 2015-07-04 DIAGNOSIS — R2681 Unsteadiness on feet: Secondary | ICD-10-CM | POA: Diagnosis not present

## 2015-07-05 DIAGNOSIS — R2681 Unsteadiness on feet: Secondary | ICD-10-CM | POA: Diagnosis not present

## 2015-07-05 DIAGNOSIS — M6281 Muscle weakness (generalized): Secondary | ICD-10-CM | POA: Diagnosis not present

## 2015-07-09 DIAGNOSIS — R2681 Unsteadiness on feet: Secondary | ICD-10-CM | POA: Diagnosis not present

## 2015-07-09 DIAGNOSIS — M6281 Muscle weakness (generalized): Secondary | ICD-10-CM | POA: Diagnosis not present

## 2015-07-16 DIAGNOSIS — R2681 Unsteadiness on feet: Secondary | ICD-10-CM | POA: Diagnosis not present

## 2015-07-16 DIAGNOSIS — M6281 Muscle weakness (generalized): Secondary | ICD-10-CM | POA: Diagnosis not present

## 2015-07-17 DIAGNOSIS — R2681 Unsteadiness on feet: Secondary | ICD-10-CM | POA: Diagnosis not present

## 2015-07-17 DIAGNOSIS — M6281 Muscle weakness (generalized): Secondary | ICD-10-CM | POA: Diagnosis not present

## 2015-07-18 DIAGNOSIS — F39 Unspecified mood [affective] disorder: Secondary | ICD-10-CM | POA: Diagnosis not present

## 2015-07-18 DIAGNOSIS — F0390 Unspecified dementia without behavioral disturbance: Secondary | ICD-10-CM | POA: Diagnosis not present

## 2015-07-18 DIAGNOSIS — M6281 Muscle weakness (generalized): Secondary | ICD-10-CM | POA: Diagnosis not present

## 2015-07-18 DIAGNOSIS — F419 Anxiety disorder, unspecified: Secondary | ICD-10-CM | POA: Diagnosis not present

## 2015-07-18 DIAGNOSIS — R2681 Unsteadiness on feet: Secondary | ICD-10-CM | POA: Diagnosis not present

## 2015-07-18 DIAGNOSIS — F339 Major depressive disorder, recurrent, unspecified: Secondary | ICD-10-CM | POA: Diagnosis not present

## 2015-08-01 DIAGNOSIS — R946 Abnormal results of thyroid function studies: Secondary | ICD-10-CM | POA: Diagnosis not present

## 2015-08-06 ENCOUNTER — Other Ambulatory Visit: Payer: Self-pay

## 2015-08-06 ENCOUNTER — Observation Stay (HOSPITAL_COMMUNITY)
Admission: EM | Admit: 2015-08-06 | Discharge: 2015-08-09 | Disposition: A | Discharge status: Nursing Home (Includes ICF) | Payer: Commercial Managed Care - HMO | Attending: Internal Medicine | Admitting: Internal Medicine

## 2015-08-06 ENCOUNTER — Encounter (HOSPITAL_COMMUNITY): Payer: Self-pay | Admitting: Emergency Medicine

## 2015-08-06 ENCOUNTER — Emergency Department (HOSPITAL_COMMUNITY): Payer: Commercial Managed Care - HMO

## 2015-08-06 DIAGNOSIS — I10 Essential (primary) hypertension: Secondary | ICD-10-CM | POA: Diagnosis present

## 2015-08-06 DIAGNOSIS — F0390 Unspecified dementia without behavioral disturbance: Secondary | ICD-10-CM | POA: Diagnosis not present

## 2015-08-06 DIAGNOSIS — Z7982 Long term (current) use of aspirin: Secondary | ICD-10-CM | POA: Diagnosis not present

## 2015-08-06 DIAGNOSIS — E785 Hyperlipidemia, unspecified: Secondary | ICD-10-CM | POA: Diagnosis not present

## 2015-08-06 DIAGNOSIS — Z79899 Other long term (current) drug therapy: Secondary | ICD-10-CM | POA: Diagnosis not present

## 2015-08-06 DIAGNOSIS — G8929 Other chronic pain: Secondary | ICD-10-CM | POA: Insufficient documentation

## 2015-08-06 DIAGNOSIS — I5032 Chronic diastolic (congestive) heart failure: Secondary | ICD-10-CM

## 2015-08-06 DIAGNOSIS — I2510 Atherosclerotic heart disease of native coronary artery without angina pectoris: Secondary | ICD-10-CM | POA: Insufficient documentation

## 2015-08-06 DIAGNOSIS — F329 Major depressive disorder, single episode, unspecified: Secondary | ICD-10-CM | POA: Insufficient documentation

## 2015-08-06 DIAGNOSIS — Z7983 Long term (current) use of bisphosphonates: Secondary | ICD-10-CM | POA: Insufficient documentation

## 2015-08-06 DIAGNOSIS — G9341 Metabolic encephalopathy: Secondary | ICD-10-CM | POA: Diagnosis present

## 2015-08-06 DIAGNOSIS — N182 Chronic kidney disease, stage 2 (mild): Secondary | ICD-10-CM | POA: Insufficient documentation

## 2015-08-06 DIAGNOSIS — G9340 Encephalopathy, unspecified: Principal | ICD-10-CM | POA: Diagnosis present

## 2015-08-06 DIAGNOSIS — J984 Other disorders of lung: Secondary | ICD-10-CM | POA: Diagnosis not present

## 2015-08-06 DIAGNOSIS — F32A Depression, unspecified: Secondary | ICD-10-CM | POA: Diagnosis present

## 2015-08-06 DIAGNOSIS — R4182 Altered mental status, unspecified: Secondary | ICD-10-CM

## 2015-08-06 DIAGNOSIS — E876 Hypokalemia: Secondary | ICD-10-CM | POA: Insufficient documentation

## 2015-08-06 DIAGNOSIS — I679 Cerebrovascular disease, unspecified: Secondary | ICD-10-CM | POA: Diagnosis not present

## 2015-08-06 DIAGNOSIS — I130 Hypertensive heart and chronic kidney disease with heart failure and stage 1 through stage 4 chronic kidney disease, or unspecified chronic kidney disease: Secondary | ICD-10-CM | POA: Diagnosis not present

## 2015-08-06 DIAGNOSIS — E039 Hypothyroidism, unspecified: Secondary | ICD-10-CM | POA: Diagnosis present

## 2015-08-06 DIAGNOSIS — K219 Gastro-esophageal reflux disease without esophagitis: Secondary | ICD-10-CM | POA: Insufficient documentation

## 2015-08-06 LAB — COMPREHENSIVE METABOLIC PANEL WITH GFR
ALT: 16 U/L (ref 14–54)
AST: 23 U/L (ref 15–41)
Albumin: 3.7 g/dL (ref 3.5–5.0)
Alkaline Phosphatase: 60 U/L (ref 38–126)
Anion gap: 7 (ref 5–15)
BUN: 11 mg/dL (ref 6–20)
CO2: 30 mmol/L (ref 22–32)
Calcium: 9.2 mg/dL (ref 8.9–10.3)
Chloride: 101 mmol/L (ref 101–111)
Creatinine, Ser: 1.14 mg/dL — ABNORMAL HIGH (ref 0.44–1.00)
GFR calc Af Amer: 51 mL/min — ABNORMAL LOW
GFR calc non Af Amer: 44 mL/min — ABNORMAL LOW
Glucose, Bld: 98 mg/dL (ref 65–99)
Potassium: 3.6 mmol/L (ref 3.5–5.1)
Sodium: 138 mmol/L (ref 135–145)
Total Bilirubin: 0.7 mg/dL (ref 0.3–1.2)
Total Protein: 6.1 g/dL — ABNORMAL LOW (ref 6.5–8.1)

## 2015-08-06 LAB — I-STAT ARTERIAL BLOOD GAS, ED
Acid-Base Excess: 4 mmol/L — ABNORMAL HIGH (ref 0.0–2.0)
Bicarbonate: 29.4 meq/L — ABNORMAL HIGH (ref 20.0–24.0)
O2 Saturation: 93 %
Patient temperature: 98.7
TCO2: 31 mmol/L (ref 0–100)
pCO2 arterial: 46 mmHg — ABNORMAL HIGH (ref 35.0–45.0)
pH, Arterial: 7.414 (ref 7.350–7.450)
pO2, Arterial: 69 mmHg — ABNORMAL LOW (ref 80.0–100.0)

## 2015-08-06 LAB — RAPID URINE DRUG SCREEN, HOSP PERFORMED
Amphetamines: NOT DETECTED
Barbiturates: NOT DETECTED
Benzodiazepines: NOT DETECTED
Cocaine: NOT DETECTED
Opiates: NOT DETECTED
Tetrahydrocannabinol: NOT DETECTED

## 2015-08-06 LAB — AMMONIA: Ammonia: 29 umol/L (ref 9–35)

## 2015-08-06 LAB — CBC WITH DIFFERENTIAL/PLATELET
BASOS ABS: 0 10*3/uL (ref 0.0–0.1)
Basophils Relative: 0 %
EOS PCT: 2 %
Eosinophils Absolute: 0.1 10*3/uL (ref 0.0–0.7)
HEMATOCRIT: 36.9 % (ref 36.0–46.0)
Hemoglobin: 12 g/dL (ref 12.0–15.0)
LYMPHS ABS: 1.7 10*3/uL (ref 0.7–4.0)
LYMPHS PCT: 41 %
MCH: 30.6 pg (ref 26.0–34.0)
MCHC: 32.5 g/dL (ref 30.0–36.0)
MCV: 94.1 fL (ref 78.0–100.0)
Monocytes Absolute: 0.4 10*3/uL (ref 0.1–1.0)
Monocytes Relative: 10 %
NEUTROS ABS: 2 10*3/uL (ref 1.7–7.7)
Neutrophils Relative %: 47 %
PLATELETS: 196 10*3/uL (ref 150–400)
RBC: 3.92 MIL/uL (ref 3.87–5.11)
RDW: 12.6 % (ref 11.5–15.5)
WBC: 4.3 10*3/uL (ref 4.0–10.5)

## 2015-08-06 LAB — URINALYSIS, ROUTINE W REFLEX MICROSCOPIC
Bilirubin Urine: NEGATIVE
Glucose, UA: NEGATIVE mg/dL
Hgb urine dipstick: NEGATIVE
Ketones, ur: NEGATIVE mg/dL
Leukocytes, UA: NEGATIVE
Nitrite: NEGATIVE
Protein, ur: NEGATIVE mg/dL
Specific Gravity, Urine: 1.016 (ref 1.005–1.030)
pH: 7.5 (ref 5.0–8.0)

## 2015-08-06 LAB — TSH: TSH: 0.247 u[IU]/mL — AB (ref 0.350–4.500)

## 2015-08-06 LAB — PROTIME-INR
INR: 1.04 (ref 0.00–1.49)
Prothrombin Time: 13.8 s (ref 11.6–15.2)

## 2015-08-06 LAB — I-STAT CG4 LACTIC ACID, ED: LACTIC ACID, VENOUS: 0.66 mmol/L (ref 0.5–2.0)

## 2015-08-06 LAB — TROPONIN I

## 2015-08-06 LAB — ETHANOL: Alcohol, Ethyl (B): <5 mg/dL (ref <5)

## 2015-08-06 MED ORDER — POLYETHYLENE GLYCOL 3350 17 G PO PACK
17.0000 g | PACK | Freq: Every day | ORAL | Status: DC
  Administered 2015-08-07 – 2015-08-09 (×2): 17 g via ORAL
  Filled 2015-08-06 (×3): qty 1

## 2015-08-06 MED ORDER — ENOXAPARIN SODIUM 40 MG/0.4ML ~~LOC~~ SOLN
40.0000 mg | Freq: Every day | SUBCUTANEOUS | Status: DC
  Administered 2015-08-07 – 2015-08-09 (×3): 40 mg via SUBCUTANEOUS
  Filled 2015-08-06 (×3): qty 0.4

## 2015-08-06 MED ORDER — HYDRALAZINE HCL 20 MG/ML IJ SOLN: 10.0000 mg | INTRAMUSCULAR | Status: DC | PRN

## 2015-08-06 MED ORDER — CLONAZEPAM 0.5 MG PO TABS
0.5000 mg | ORAL_TABLET | Freq: Two times a day (BID) | ORAL | Status: DC | PRN
  Administered 2015-08-07: 0.5 mg via ORAL
  Filled 2015-08-06: qty 1

## 2015-08-06 MED ORDER — PRAVASTATIN SODIUM 20 MG PO TABS
20.0000 mg | ORAL_TABLET | Freq: Every day | ORAL | Status: DC
  Administered 2015-08-07 – 2015-08-08 (×2): 20 mg via ORAL
  Filled 2015-08-06 (×2): qty 1

## 2015-08-06 MED ORDER — CALCIUM CARBONATE-VITAMIN D 500-200 MG-UNIT PO TABS
1.0000 | ORAL_TABLET | Freq: Two times a day (BID) | ORAL | Status: DC
  Administered 2015-08-07 – 2015-08-09 (×5): 1 via ORAL
  Filled 2015-08-06 (×6): qty 1

## 2015-08-06 MED ORDER — SODIUM CHLORIDE 0.9 % IV SOLN
INTRAVENOUS | Status: AC
  Administered 2015-08-07: via INTRAVENOUS

## 2015-08-06 MED ORDER — METOPROLOL TARTRATE 25 MG PO TABS
25.0000 mg | ORAL_TABLET | Freq: Two times a day (BID) | ORAL | Status: DC
  Administered 2015-08-07 – 2015-08-09 (×5): 25 mg via ORAL
  Filled 2015-08-06 (×5): qty 1

## 2015-08-06 MED ORDER — OXYCODONE-ACETAMINOPHEN 5-325 MG PO TABS
1.0000 | ORAL_TABLET | ORAL | Status: DC | PRN
  Filled 2015-08-06: qty 1

## 2015-08-06 MED ORDER — VITAMIN D 1000 UNITS PO TABS
1000.0000 [IU] | ORAL_TABLET | Freq: Every day | ORAL | Status: DC
  Administered 2015-08-07 – 2015-08-09 (×3): 1000 [IU] via ORAL
  Filled 2015-08-06 (×3): qty 1

## 2015-08-06 MED ORDER — SODIUM CHLORIDE 0.9 % IV BOLUS (SEPSIS)
1000.0000 mL | Freq: Once | INTRAVENOUS | Status: AC
  Administered 2015-08-06: 1000 mL via INTRAVENOUS

## 2015-08-06 MED ORDER — ALBUTEROL SULFATE (2.5 MG/3ML) 0.083% IN NEBU: 2.5000 mg | INHALATION_SOLUTION | Freq: Four times a day (QID) | RESPIRATORY_TRACT | Status: DC | PRN

## 2015-08-06 MED ORDER — DIVALPROEX SODIUM 125 MG PO CSDR
125.0000 mg | DELAYED_RELEASE_CAPSULE | Freq: Two times a day (BID) | ORAL | Status: DC
  Administered 2015-08-07 – 2015-08-09 (×5): 125 mg via ORAL
  Filled 2015-08-06 (×5): qty 1

## 2015-08-06 MED ORDER — ASPIRIN 325 MG PO TABS
325.0000 mg | ORAL_TABLET | Freq: Every day | ORAL | Status: DC
  Administered 2015-08-07 – 2015-08-09 (×3): 325 mg via ORAL
  Filled 2015-08-06 (×3): qty 1

## 2015-08-06 NOTE — H&P (Addendum)
History and Physical    Karla Alexander [id number] DOB: 12-27-1932 DOA: 08/06/2015  PCP: Radene Gunning, MD  Patient coming from: Assisted living facility.  Chief Complaint: Confusion.  HPI: Karla Alexander is a 82 y.o. female with medical history significant of depression, chronic kidney disease stage II, anemia, hypertension, hyperlipidemia and chronic pain was brought from the assisted living facility after patient was found to be confused and having some slurred speech. Patient on exam was oriented to name and month. Follows commands and moves all extremities. As per the ER physician who had talked with the nurse at the facility patient was confused which was sudden in onset and has some slurred speech. CT of the head was unremarkable. Ammonia levels were within acceptable limits. TSH was on the low side. On exam patient is able to move all extremities. Patient is mildly febrile and chest x-ray showed bronchitis changes with some large cardiac silhouette with no definite acute changes and UA is unremarkable. Patient has been admitted for acute encephalopathy. MRI brain is pending.   ED Course: See history of present illness.  Review of Systems: As per HPI otherwise 10 point review of systems negative.    Past Medical History  Diagnosis Date  . Depression   . GERD (gastroesophageal reflux disease)   . Hypothyroidism   . Shortness of breath   . Arthritis   . Hypertension     dr Delsa Sale   [place]  . Coronary artery disease     non-obstructive CAD 04/2013  . Constipation   . Seasonal allergies   . Hyperlipemia   . Dementia     Past Surgical History  Procedure Laterality Date  . No past surgeries    . Cardiac catheterization  04/24/2013    20% mLAD, 20% OM1, 30% #1/60% #2 pRCA [facility]).  EF 68% by stress test 04/12/13.   . Maximum access (mas)posterior lumbar interbody fusion (plif) 2 level N/A 05/31/2013    Procedure: FOR MAXIMUM ACCESS (MAS) POSTERIOR LUMBAR INTERBODY  FUSION (PLIF) Lumbar Four-Five, Lumbar Five-Sacral One;  Surgeon: Eustace Moore, MD;  Location: [facility];  Service: Neurosurgery;  Laterality: N/A;  FOR MAXIMUM ACCESS (MAS) POSTERIOR LUMBAR INTERBODY FUSION (PLIF) Lumbar Four-Five, Lumbar Five-Sacral One  . Back surgery    . Reverse shoulder arthroplasty Left 08/23/2014    Procedure: REVERSE SHOULDER ARTHROPLASTY WITH APPLICATION OF CABLES ;  Surgeon: Tania Ade, MD;  Location: [place];  Service: Orthopedics;  Laterality: Left;  Left reverse total shoulder arthroplasty     reports that she has never smoked. She does not have any smokeless tobacco history on file. She reports that she does not drink alcohol or use illicit drugs.  Allergies  Allergen Reactions  . Codeine     Other reaction(s): Hallucination    Family History  Problem Relation Age of Onset  . Family history unknown: Yes    Prior to Admission medications   Medication Sig Start Date End Date Taking? Authorizing Provider  alendronate (FOSAMAX) 70 MG tablet Take 70 mg by mouth once a week. Take with a full glass of water on an empty stomach.   Yes Historical Provider, MD  aspirin 325 MG tablet Take 325 mg by mouth daily.   Yes Historical Provider, MD  Calcium Citrate-Vitamin D 250-200 MG-UNIT TABS Take 1 tablet by mouth 2 (two) times daily.   Yes Historical Provider, MD  Cholecalciferol 1000 UNITS capsule Take 1,000 Units by mouth daily.   Yes Historical Provider, MD  clonazePAM (KLONOPIN) 0.5 MG tablet Take 0.5 mg by mouth 2 (two) times daily as needed for anxiety.    Yes Historical Provider, MD  divalproex (DEPAKOTE SPRINKLE) 125 MG capsule Take 125 mg by mouth 2 (two) times daily.   Yes Historical Provider, MD  levothyroxine (SYNTHROID, LEVOTHROID) 75 MCG tablet Take 75 mcg by mouth daily before breakfast.   Yes Historical Provider, MD  lovastatin (MEVACOR) 20 MG tablet Take 20 mg by mouth.   Yes Historical Provider, MD  metoprolol (LOPRESSOR) 50 MG tablet Take 25 mg  by mouth 2 (two) times daily.   Yes Historical Provider, MD  oxyCODONE-acetaminophen (ROXICET) 5-325 MG per tablet Take 1-2 tablets by mouth every 4 (four) hours as needed for severe pain. 08/24/14  Yes Grier Mitts, PA-C  polyethylene glycol (MIRALAX / GLYCOLAX) packet Take 17 g by mouth daily.   Yes Historical Provider, MD  triamterene-hydrochlorothiazide (DYAZIDE) 37.5-25 MG per capsule Take 1 capsule by mouth daily.   Yes Historical Provider, MD  albuterol (PROVENTIL HFA;VENTOLIN HFA) 108 (90 BASE) MCG/ACT inhaler Inhale 1 puff into the lungs every 6 (six) hours as needed for wheezing or shortness of breath.    Historical Provider, MD  alum & mag hydroxide-simeth (MAALOX/MYLANTA) 200-200-20 MG/5ML suspension Take 30 mLs by mouth every 4 (four) hours as needed for indigestion or heartburn.    Historical Provider, MD    Physical Exam: Filed Vitals:   08/06/15 2245 08/06/15 2259 08/06/15 2315 08/06/15 2345  BP: 116/86  113/94 148/64  Pulse: 70  69 70  Temp:  98.8 F (37.1 C)    TempSrc:      Resp: 16  14 14   Height:      Weight:      SpO2: 97%  95% 96%      Constitutional: Not in distress. Filed Vitals:   08/06/15 2245 08/06/15 2259 08/06/15 2315 08/06/15 2345  BP: 116/86  113/94 148/64  Pulse: 70  69 70  Temp:  98.8 F (37.1 C)    TempSrc:      Resp: 16  14 14   Height:      Weight:      SpO2: 97%  95% 96%   Eyes: Anicteric no pallor. ENMT: No discharge from the ears eyes nose and mouth. Neck: No mass felt. No JVD appreciated. Respiratory: No rhonchi or crepitations. Cardiovascular: S1 and S2 heard. Abdomen: Soft nontender bowel sounds present. Musculoskeletal: No edema. Skin: No rash. Neurologic: Alert awake oriented to name and month but not date and place. Moves all extremities. No facial asymmetry. Tongue is midline. PERRLA positive. Psychiatric: Patient is oriented to name and month.   Labs on Admission: I have personally reviewed following labs and imaging  studies  CBC:  Recent Labs Lab 08/06/15 1958  WBC 4.3  NEUTROABS 2.0  HGB 12.0  HCT 36.9  MCV 94.1  PLT 123456   Basic Metabolic Panel:  Recent Labs Lab 08/06/15 1958  NA 138  K 3.6  CL 101  CO2 30  GLUCOSE 98  BUN 11  CREATININE 1.14*  CALCIUM 9.2   GFR: Estimated Creatinine Clearance: 37 mL/min (by C-G formula based on Cr of 1.14). Liver Function Tests:  Recent Labs Lab 08/06/15 1958  AST 23  ALT 16  ALKPHOS 60  BILITOT 0.7  PROT 6.1*  ALBUMIN 3.7   No results for input(s): LIPASE, AMYLASE in the last 168 hours.  Recent Labs Lab 08/06/15 1958  AMMONIA 29   Coagulation Profile:  Recent  Labs Lab 08/06/15 1958  INR 1.04   Cardiac Enzymes:  Recent Labs Lab 08/06/15 1958  TROPONINI <0.03   BNP (last 3 results) No results for input(s): PROBNP in the last 8760 hours. HbA1C: No results for input(s): HGBA1C in the last 72 hours. CBG: No results for input(s): GLUCAP in the last 168 hours. Lipid Profile: No results for input(s): CHOL, HDL, LDLCALC, TRIG, CHOLHDL, LDLDIRECT in the last 72 hours. Thyroid Function Tests:  Recent Labs  08/06/15 1958  TSH 0.247*   Anemia Panel: No results for input(s): VITAMINB12, FOLATE, FERRITIN, TIBC, IRON, RETICCTPCT in the last 72 hours. Urine analysis:    Component Value Date/Time   COLORURINE YELLOW 08/06/2015 1956   COLORURINE Yellow 01/31/2014 [place] 08/06/2015 1956   APPEARANCEUR Clear 01/31/2014 1418   LABSPEC 1.016 08/06/2015 1956   LABSPEC 1.013 01/31/2014 1418   PHURINE 7.5 08/06/2015 1956   PHURINE 6.0 01/31/2014 1418   GLUCOSEU NEGATIVE 08/06/2015 1956   GLUCOSEU Negative 01/31/2014 1418   HGBUR NEGATIVE 08/06/2015 1956   HGBUR Negative 01/31/2014 1418   BILIRUBINUR NEGATIVE 08/06/2015 1956   BILIRUBINUR Negative 01/31/2014 1418   KETONESUR NEGATIVE 08/06/2015 1956   KETONESUR Negative 01/31/2014 1418   PROTEINUR NEGATIVE 08/06/2015 1956   PROTEINUR Negative  01/31/2014 1418   UROBILINOGEN 1.0 08/15/2014 1236   NITRITE NEGATIVE 08/06/2015 1956   NITRITE Negative 01/31/2014 1418   LEUKOCYTESUR NEGATIVE 08/06/2015 1956   LEUKOCYTESUR Negative 01/31/2014 1418   Sepsis Labs: @LABRCNTIP (procalcitonin:4,lacticidven:4) )No results found for this or any previous visit (from the past 240 hour(s)).   Radiological Exams on Admission: Dg Chest 2 View  08/06/2015  CLINICAL DATA:  Altered mental status, hypertension, coronary artery disease, hyperlipidemia, dementia EXAM: CHEST  2 VIEW COMPARISON:  08/15/2014 FINDINGS: Enlargement of cardiac silhouette. Prominent superior mediastinum which may be accentuated by lordotic technique. Mediastinal contours and pulmonary vascularity otherwise normal. Chronic bronchitic interstitial changes similar to prior study. No definite acute infiltrate, pleural effusion or pneumothorax. LEFT shoulder prosthesis. IMPRESSION: Enlargement of cardiac silhouette. Chronic bronchitic interstitial changes without definite acute infiltrate. Electronically Signed   By: Lavonia Dana M.D.   On: 08/06/2015 20:53   Ct Head Wo Contrast  08/06/2015  CLINICAL DATA:  Altered mental status. Increased confusion around noon today. EXAM: CT HEAD WITHOUT CONTRAST TECHNIQUE: Contiguous axial images were obtained from the base of the skull through the vertex without intravenous contrast. COMPARISON:  01/31/2014 FINDINGS: Diffuse cerebral atrophy. Ventricular dilatation consistent with central atrophy. Low-attenuation changes throughout the deep white matter consistent small vessel ischemia. No change in pattern since previous study. All all No mass effect or midline shift. No abnormal extra-axial fluid collections. Gray-white matter junctions are distinct. Basal cisterns are not effaced. No evidence of acute intracranial hemorrhage. No depressed skull fractures. Mild mucosal thickening in the paranasal sinuses. No acute air-fluid levels. Mastoid air cells are  not opacified. IMPRESSION: No acute intracranial abnormalities. Chronic atrophy and small vessel ischemic changes. Electronically Signed   By: Lucienne Capers M.D.   On: 08/06/2015 21:39    EKG: Independently reviewed. Normal sinus rhythm.  Assessment/Plan Principal Problem:   Acute encephalopathy Active Problems:   Hypothyroidism   HLD (hyperlipidemia)   Depression   Hypertension    #1. Acute encephalopathy - cause not clear. MRI brain is pending. Will check EEG and Depakote levels. TSH is mildly on the low side for which we will hold off Synthroid for at least couple of days and restart at a  lower dose and will need close follow-up of TSH. Ammonia levels are normal. Check RPR.  #2. Hypoxia - ABG done showed no carbon dioxide retention but does show some hypoxia. Will check BNP 2-D echo and d-dimer. This could also be contributing to patient's #1. #3. Hypothyroidism - TSH in the lower side so I'm holding off Synthroid and restart at a lower dose of the couple of days. Will need close follow-up of TSH. #4. Chronic kidney disease stage II - creatinine appears to be at baseline. #5. Chronic pain - continue when necessary pain medications. #6. Hyperlipidemia on statins. #7. Hypertension - will hold off hydrochlorothiazide and gently hydrate and continue metoprolol. #8. Depression on Depakote.  Patient's healthcare power of attorney is one of the state.  DVT prophylaxis: Lovenox. Code Status: Full code.  Family Communication: Healthcare power of attorney is ward of the state. Disposition Plan: Assisted-living facility.  Consults called: None.  Admission status: Observation. Telemetry.    Rise Patience MD [facility] Pager [phone].  If 7PM-7AM, please contact night-coverage [email or web address] Password [facility]  08/06/2015, 11:54 PM

## 2015-08-06 NOTE — ED Notes (Signed)
Ems states pt was up this morning at the nsg home at baseline. Staff noticed she was confused/had more confusion than normal around noon today

## 2015-08-06 NOTE — ED Provider Notes (Signed)
CSN: [id number]     Arrival date & time 08/06/15  1849 History   First MD Initiated Contact with Patient 08/06/15 1859     Chief Complaint  Patient presents with  . Altered Mental Status   LEVEL 5 CAVEAT - AMS  (Consider location/radiation/quality/duration/timing/severity/associated sxs/prior Treatment) HPI  82 year old female presents with acute altered mental status. History is limited from the patient given history of prior dementia as well as acute confusion. History is obtained from the NP, Kendal Hymen, for her living facility. Apparently the patient is normally alert and oriented and walks normally. She was last seen normal around 12 PM. Then she started dropping things like water bottles. She almost fell backwards which is atypical for her. She then all of a sudden had trouble getting words out. The nurse practitioner think she saw some nystagmus as well. Patient was sent here for further evaluation. Has not been having a fever or otherwise been acutely ill. Patient is currently a warden of the state.  Past Medical History  Diagnosis Date  . Depression   . GERD (gastroesophageal reflux disease)   . Hypothyroidism   . Shortness of breath   . Arthritis   . Hypertension     dr Delsa Sale   [place]  . Coronary artery disease     non-obstructive CAD 04/2013  . Constipation   . Seasonal allergies   . Hyperlipemia   . Dementia    Past Surgical History  Procedure Laterality Date  . No past surgeries    . Cardiac catheterization  04/24/2013    20% mLAD, 20% OM1, 30% #1/60% #2 pRCA [facility]).  EF 68% by stress test 04/12/13.   . Maximum access (mas)posterior lumbar interbody fusion (plif) 2 level N/A 05/31/2013    Procedure: FOR MAXIMUM ACCESS (MAS) POSTERIOR LUMBAR INTERBODY FUSION (PLIF) Lumbar Four-Five, Lumbar Five-Sacral One;  Surgeon: Eustace Moore, MD;  Location: [facility];  Service: Neurosurgery;  Laterality: N/A;  FOR MAXIMUM ACCESS (MAS) POSTERIOR LUMBAR INTERBODY FUSION (PLIF)  Lumbar Four-Five, Lumbar Five-Sacral One  . Back surgery    . Reverse shoulder arthroplasty Left 08/23/2014    Procedure: REVERSE SHOULDER ARTHROPLASTY WITH APPLICATION OF CABLES ;  Surgeon: Tania Ade, MD;  Location: [place];  Service: Orthopedics;  Laterality: Left;  Left reverse total shoulder arthroplasty   History reviewed. No pertinent family history. Social History  Substance Use Topics  . Smoking status: Never Smoker   . Smokeless tobacco: None  . Alcohol Use: No   OB History    No data available     Review of Systems  Unable to perform ROS: Mental status change      Allergies  Codeine  Home Medications   Prior to Admission medications   Medication Sig Start Date End Date Taking? Authorizing Provider  albuterol (PROVENTIL HFA;VENTOLIN HFA) 108 (90 BASE) MCG/ACT inhaler Inhale 1 puff into the lungs every 6 (six) hours as needed for wheezing or shortness of breath.    Historical Provider, MD  alendronate (FOSAMAX) 70 MG tablet Take 70 mg by mouth once a week. Take with a full glass of water on an empty stomach.    Historical Provider, MD  alum & mag hydroxide-simeth (MAALOX/MYLANTA) 200-200-20 MG/5ML suspension Take 30 mLs by mouth every 4 (four) hours as needed for indigestion or heartburn.    Historical Provider, MD  aspirin 325 MG tablet Take 325 mg by mouth daily.    Historical Provider, MD  Cholecalciferol 1000 UNITS capsule Take 1,000  Units by mouth daily.    Historical Provider, MD  citalopram (CELEXA) 10 MG tablet Take 20 mg by mouth daily.    Historical Provider, MD  clonazePAM (KLONOPIN) 0.5 MG tablet Take 0.5 mg by mouth 2 (two) times daily as needed for anxiety.     Historical Provider, MD  docusate sodium (COLACE) 100 MG capsule Take 100 mg by mouth daily as needed for mild constipation.    Historical Provider, MD  esomeprazole (NEXIUM) 20 MG capsule Take 20 mg by mouth daily at 12 noon.    Historical Provider, MD  fluticasone (FLONASE) 50 MCG/ACT nasal spray  Place 1 spray into both nostrils daily.    Historical Provider, MD  Fluticasone-Salmeterol (ADVAIR) 100-50 MCG/DOSE AEPB Inhale 1 puff into the lungs 2 (two) times daily.    Historical Provider, MD  guaiFENesin (ROBITUSSIN) 100 MG/5ML SOLN Take 20 mLs by mouth every 4 (four) hours as needed for cough or to loosen phlegm.    Historical Provider, MD  levothyroxine (SYNTHROID, LEVOTHROID) 100 MCG tablet Take 100 mcg by mouth daily before breakfast.    Historical Provider, MD  lovastatin (MEVACOR) 20 MG tablet Take 20 mg by mouth.    Historical Provider, MD  metoprolol (LOPRESSOR) 50 MG tablet Take 25 mg by mouth 2 (two) times daily.    Historical Provider, MD  oxyCODONE-acetaminophen (ROXICET) 5-325 MG per tablet Take 1-2 tablets by mouth every 4 (four) hours as needed for severe pain. 08/24/14   Grier Mitts, PA-C  triamterene-hydrochlorothiazide (DYAZIDE) 37.5-25 MG per capsule Take 1 capsule by mouth daily.    Historical Provider, MD   BP 115/61 mmHg  Pulse 62  Temp(Src) 99.5 F (37.5 C) (Rectal)  Resp 13  Ht 5\' 3"  (1.6 m)  Wt 160 lb (72.576 kg)  BMI 28.35 kg/m2  SpO2 91% Physical Exam  Constitutional: She appears well-developed and well-nourished. No distress.  HENT:  Head: Normocephalic and atraumatic.  Right Ear: External ear normal.  Left Ear: External ear normal.  Nose: Nose normal.  Eyes: EOM are normal. Pupils are equal, round, and reactive to light. Right eye exhibits no discharge. Left eye exhibits no discharge.  Neck: Normal range of motion. Neck supple.  Cardiovascular: Normal rate, regular rhythm and normal heart sounds.   Pulmonary/Chest: Effort normal and breath sounds normal.  Abdominal: Soft. There is no tenderness.  Neurological: She is alert. She is disoriented.  Alert but disoriented. Slurred speech but she barely opens her mouth and is speaking through gritted teeth. Equal strength in all 4 extremities  Skin: Skin is warm and dry. She is not diaphoretic.   Nursing note and vitals reviewed.   ED Course  Procedures (including critical care time) Labs Review Labs Reviewed  COMPREHENSIVE METABOLIC PANEL - Abnormal; Notable for the following:    Creatinine, Ser 1.14 (*)    Total Protein 6.1 (*)    GFR calc non Af Amer 44 (*)    GFR calc Af Amer 51 (*)    All other components within normal limits  TSH - Abnormal; Notable for the following:    TSH 0.247 (*)    All other components within normal limits  I-STAT ARTERIAL BLOOD GAS, ED - Abnormal; Notable for the following:    pCO2 arterial 46.0 (*)    pO2, Arterial 69.0 (*)    Bicarbonate 29.4 (*)    Acid-Base Excess 4.0 (*)    All other components within normal limits  URINE CULTURE  ETHANOL  TROPONIN I  CBC WITH DIFFERENTIAL/PLATELET  PROTIME-INR  URINALYSIS, ROUTINE W REFLEX MICROSCOPIC (NOT AT [facility])  URINE RAPID DRUG SCREEN, HOSP PERFORMED  AMMONIA  I-STAT CG4 LACTIC ACID, ED  CBG MONITORING, ED    Imaging Review Dg Chest 2 View  08/06/2015  CLINICAL DATA:  Altered mental status, hypertension, coronary artery disease, hyperlipidemia, dementia EXAM: CHEST  2 VIEW COMPARISON:  08/15/2014 FINDINGS: Enlargement of cardiac silhouette. Prominent superior mediastinum which may be accentuated by lordotic technique. Mediastinal contours and pulmonary vascularity otherwise normal. Chronic bronchitic interstitial changes similar to prior study. No definite acute infiltrate, pleural effusion or pneumothorax. LEFT shoulder prosthesis. IMPRESSION: Enlargement of cardiac silhouette. Chronic bronchitic interstitial changes without definite acute infiltrate. Electronically Signed   By: Lavonia Dana M.D.   On: 08/06/2015 20:53   Ct Head Wo Contrast  08/06/2015  CLINICAL DATA:  Altered mental status. Increased confusion around noon today. EXAM: CT HEAD WITHOUT CONTRAST TECHNIQUE: Contiguous axial images were obtained from the base of the skull through the vertex without intravenous contrast. COMPARISON:   01/31/2014 FINDINGS: Diffuse cerebral atrophy. Ventricular dilatation consistent with central atrophy. Low-attenuation changes throughout the deep white matter consistent small vessel ischemia. No change in pattern since previous study. All all No mass effect or midline shift. No abnormal extra-axial fluid collections. Gray-white matter junctions are distinct. Basal cisterns are not effaced. No evidence of acute intracranial hemorrhage. No depressed skull fractures. Mild mucosal thickening in the paranasal sinuses. No acute air-fluid levels. Mastoid air cells are not opacified. IMPRESSION: No acute intracranial abnormalities. Chronic atrophy and small vessel ischemic changes. Electronically Signed   By: Lucienne Capers M.D.   On: 08/06/2015 21:39   I have personally reviewed and evaluated these images and lab results as part of my medical decision-making.   EKG Interpretation   Date/Time:  Tuesday Aug 06 2015 18:48:36 EDT Ventricular Rate:  68 PR Interval:  198 QRS Duration: 80 QT Interval:  430 QTC Calculation: 457 R Axis:   77 Text Interpretation:  Normal sinus rhythm Nonspecific ST abnormality  Abnormal ECG ST abnormality new compared to May 2016 Confirmed by Regenia Skeeter  MD, [place] [phone]) on 08/06/2015 7:28:34 PM      MDM   Final diagnoses:  Altered mental status, unspecified altered mental status type    Unclear why patient is altered. She is awake and alert but confused. No focal findings on exam. No obvious infection. I highly doubt encephalitis/meningitis with no fever, elevated WBC or neck stiffness. Possibly CVA although onset and progression is odd. Will need MRI. Ordered from ED. D/w Dr. Hal Hope, will admit to tele obs.    [place]wood Gambler, MD 08/06/15 [phone]

## 2015-08-06 NOTE — ED Notes (Signed)
Dr. Kakrakandy at the bedside.  

## 2015-08-06 NOTE — ED Notes (Signed)
Prepared for transport. Respiratory therapist at the bedside for ABG.

## 2015-08-07 ENCOUNTER — Observation Stay (HOSPITAL_BASED_OUTPATIENT_CLINIC_OR_DEPARTMENT_OTHER): Payer: Commercial Managed Care - HMO

## 2015-08-07 ENCOUNTER — Observation Stay (HOSPITAL_COMMUNITY): Payer: Commercial Managed Care - HMO

## 2015-08-07 DIAGNOSIS — G9340 Encephalopathy, unspecified: Secondary | ICD-10-CM | POA: Diagnosis not present

## 2015-08-07 DIAGNOSIS — F0390 Unspecified dementia without behavioral disturbance: Secondary | ICD-10-CM | POA: Diagnosis not present

## 2015-08-07 DIAGNOSIS — R4182 Altered mental status, unspecified: Secondary | ICD-10-CM | POA: Diagnosis not present

## 2015-08-07 DIAGNOSIS — I10 Essential (primary) hypertension: Secondary | ICD-10-CM | POA: Diagnosis not present

## 2015-08-07 DIAGNOSIS — I319 Disease of pericardium, unspecified: Secondary | ICD-10-CM | POA: Diagnosis not present

## 2015-08-07 DIAGNOSIS — E039 Hypothyroidism, unspecified: Secondary | ICD-10-CM | POA: Diagnosis not present

## 2015-08-07 LAB — CBC
HEMATOCRIT: 35.5 % — AB (ref 36.0–46.0)
HEMOGLOBIN: 11.6 g/dL — AB (ref 12.0–15.0)
MCH: 30.7 pg (ref 26.0–34.0)
MCHC: 32.7 g/dL (ref 30.0–36.0)
MCV: 93.9 fL (ref 78.0–100.0)
Platelets: 179 10*3/uL (ref 150–400)
RBC: 3.78 MIL/uL — AB (ref 3.87–5.11)
RDW: 12.7 % (ref 11.5–15.5)
WBC: 4.7 10*3/uL (ref 4.0–10.5)

## 2015-08-07 LAB — RPR: RPR: NONREACTIVE

## 2015-08-07 LAB — COMPREHENSIVE METABOLIC PANEL
ALK PHOS: 59 U/L (ref 38–126)
ALT: 16 U/L (ref 14–54)
AST: 21 U/L (ref 15–41)
Albumin: 3.5 g/dL (ref 3.5–5.0)
Anion gap: 10 (ref 5–15)
BUN: 10 mg/dL (ref 6–20)
CALCIUM: 8.9 mg/dL (ref 8.9–10.3)
CHLORIDE: 103 mmol/L (ref 101–111)
CO2: 29 mmol/L (ref 22–32)
CREATININE: 1.08 mg/dL — AB (ref 0.44–1.00)
GFR calc Af Amer: 54 mL/min — ABNORMAL LOW (ref >60)
GFR, EST NON AFRICAN AMERICAN: 47 mL/min — AB (ref >60)
Glucose, Bld: 105 mg/dL — ABNORMAL HIGH (ref 65–99)
Potassium: 3.5 mmol/L (ref 3.5–5.1)
Sodium: 142 mmol/L (ref 135–145)
Total Bilirubin: 0.4 mg/dL (ref 0.3–1.2)
Total Protein: 5.9 g/dL — ABNORMAL LOW (ref 6.5–8.1)

## 2015-08-07 LAB — GLUCOSE, CAPILLARY: Glucose-Capillary: 104 mg/dL — ABNORMAL HIGH (ref 65–99)

## 2015-08-07 LAB — HIV ANTIBODY (ROUTINE TESTING W REFLEX): HIV SCREEN 4TH GENERATION: NONREACTIVE

## 2015-08-07 LAB — BRAIN NATRIURETIC PEPTIDE: B Natriuretic Peptide: 127.8 pg/mL — ABNORMAL HIGH (ref 0.0–100.0)

## 2015-08-07 LAB — ECHOCARDIOGRAM COMPLETE
HEIGHTINCHES: 63 in
Weight: 2560 oz

## 2015-08-07 LAB — D-DIMER, QUANTITATIVE: D-Dimer, Quant: 0.56 ug/mL-FEU — ABNORMAL HIGH (ref 0.00–0.50)

## 2015-08-07 LAB — VALPROIC ACID LEVEL: Valproic Acid Lvl: 67 ug/mL (ref 50.0–100.0)

## 2015-08-07 MED ORDER — AMOXICILLIN-POT CLAVULANATE 875-125 MG PO TABS
1.0000 | ORAL_TABLET | Freq: Two times a day (BID) | ORAL | Status: DC
  Administered 2015-08-07 – 2015-08-09 (×5): 1 via ORAL
  Filled 2015-08-07 (×5): qty 1

## 2015-08-07 MED ORDER — ALBUTEROL SULFATE (2.5 MG/3ML) 0.083% IN NEBU
2.5000 mg | INHALATION_SOLUTION | RESPIRATORY_TRACT | Status: DC
  Administered 2015-08-07: 2.5 mg via RESPIRATORY_TRACT
  Filled 2015-08-07: qty 3

## 2015-08-07 NOTE — Progress Notes (Signed)
Patient very anxious. Patient states she is supposed to be somewhere and mike and them won't know where i am. Patient reoriented multiple times. Patient continues to get up and down out of bed. Patient ambulated, toileted, reoriented to unit, call light placed within patients reach, bed alarm activated and safety floor mat in place. RN and NT will continue to monitor.

## 2015-08-07 NOTE — Progress Notes (Addendum)
PROGRESS NOTE    Karla Alexander  [id number] DOB: February 14, 1933 DOA: 08/06/2015 PCP: Radene Gunning, MD   Outpatient Specialists:    Brief Narrative:  Karla Alexander is a 82 y.o. female with medical history significant of depression, chronic kidney disease stage II, anemia, hypertension, hyperlipidemia and chronic pain was brought from the assisted living facility after patient was found to be confused and having some slurred speech. Patient on exam was oriented to name and month. Follows commands and moves all extremities. As per the ER physician who had talked with the nurse at the facility patient was confused which was sudden in onset and has some slurred speech. CT of the head was unremarkable. Ammonia levels were within acceptable limits. TSH was on the low side. On exam patient is able to move all extremities.  Per family, she is ward of the state now.      Assessment & Plan:   Principal Problem:   Acute encephalopathy Active Problems:   Hypothyroidism   HLD (hyperlipidemia)   Depression   Hypertension   Acute encephalopathy - cause not clear- but appears resolved-- ?TIA -MRI brain negative -echo/carotid-- on 325 ASA FLP in AM - Depakote level ok -TSH is mildly on the low side for which we will hold off Synthroid for at least couple of days and restart at a lower dose and will need close follow-up of TSH -Ammonia levels are normal.  -RPR- non- reactive -PT/OT eval -U/A negatiave Hypoxia - 96% on RA  Baseline dementia -unsure of baseline mental status other than oriented to person/place which she is now  Hypothyroidism - TSH in the lower side  -holding off Synthroid and restart at a lower dose of the couple of days. Will need close follow-up of TSH.  Marland KitchenChronic kidney disease stage II - creatinine appears to be at baseline.   Chronic pain - continue when necessary pain medications.   Hyperlipidemia on statins.  Hypertension - will hold off  hydrochlorothiazide and gently hydrate and continue metoprolol.  Depression on Depakote.   DVT prophylaxis:  Lovenox   Code Status: Full Code   Family Communication: Niece says patient is a ward of the state currently  Disposition Plan:  From [facility] -await PT eval   Consultants:   Social services  Procedures:        Subjective: Slipped to her knee this AM -no complaints  Objective: Filed Vitals:   08/07/15 0000 08/07/15 0240 08/07/15 0817 08/07/15 1035  BP: 119/55 120/57  110/52  Pulse: 61 64 81 71  Temp:  98.7 F (37.1 C)  98.7 F (37.1 C)  TempSrc:  Oral  Oral  Resp: 17 18 16 18   Height:      Weight:      SpO2: 92% 93% 94% 96%   No intake or output data in the 24 hours ending 08/07/15 1357 Filed Weights   08/06/15 1853  Weight: 72.576 kg (160 lb)    Examination:  General exam: Appears calm and comfortable  Respiratory system: Clear to auscultation. Respiratory effort normal. Cardiovascular system: S1 & S2 heard, RRR. No JVD, murmurs, rubs, gallops or clicks. No pedal edema. Gastrointestinal system: Abdomen is nondistended, soft and nontender. No organomegaly or masses felt. Normal bowel sounds heard. Central nervous system: Alert and oriented. No focal neurological deficits. Extremities: Symmetric 5 x 5 power. Skin: No rashes, lesions or ulcers Psychiatry: Judgement and insight appear normal. Mood & affect appropriate.     Data Reviewed: I have personally reviewed  following labs and imaging studies  CBC:  Recent Labs Lab 08/06/15 1958 08/07/15 0435  WBC 4.3 4.7  NEUTROABS 2.0  --   HGB 12.0 11.6*  HCT 36.9 35.5*  MCV 94.1 93.9  PLT 196 0000000   Basic Metabolic Panel:  Recent Labs Lab 08/06/15 1958 08/07/15 0435  NA 138 142  K 3.6 3.5  CL 101 103  CO2 30 29  GLUCOSE 98 105*  BUN 11 10  CREATININE 1.14* 1.08*  CALCIUM 9.2 8.9   GFR: Estimated Creatinine Clearance: 39 mL/min (by C-G formula based on Cr of  1.08). Liver Function Tests:  Recent Labs Lab 08/06/15 1958 08/07/15 0435  AST 23 21  ALT 16 16  ALKPHOS 60 59  BILITOT 0.7 0.4  PROT 6.1* 5.9*  ALBUMIN 3.7 3.5   No results for input(s): LIPASE, AMYLASE in the last 168 hours.  Recent Labs Lab 08/06/15 1958  AMMONIA 29   Coagulation Profile:  Recent Labs Lab 08/06/15 1958  INR 1.04   Cardiac Enzymes:  Recent Labs Lab 08/06/15 1958  TROPONINI <0.03   BNP (last 3 results) No results for input(s): PROBNP in the last 8760 hours. HbA1C: No results for input(s): HGBA1C in the last 72 hours. CBG:  Recent Labs Lab 08/06/15 1840  GLUCAP 104*   Lipid Profile: No results for input(s): CHOL, HDL, LDLCALC, TRIG, CHOLHDL, LDLDIRECT in the last 72 hours. Thyroid Function Tests:  Recent Labs  08/06/15 1958  TSH 0.247*   Anemia Panel: No results for input(s): VITAMINB12, FOLATE, FERRITIN, TIBC, IRON, RETICCTPCT in the last 72 hours. Urine analysis:    Component Value Date/Time   COLORURINE YELLOW 08/06/2015 1956   COLORURINE Yellow 01/31/2014 [place] 08/06/2015 1956   APPEARANCEUR Clear 01/31/2014 1418   LABSPEC 1.016 08/06/2015 1956   LABSPEC 1.013 01/31/2014 1418   PHURINE 7.5 08/06/2015 1956   PHURINE 6.0 01/31/2014 1418   GLUCOSEU NEGATIVE 08/06/2015 1956   GLUCOSEU Negative 01/31/2014 1418   HGBUR NEGATIVE 08/06/2015 1956   HGBUR Negative 01/31/2014 1418   BILIRUBINUR NEGATIVE 08/06/2015 1956   BILIRUBINUR Negative 01/31/2014 1418   KETONESUR NEGATIVE 08/06/2015 1956   KETONESUR Negative 01/31/2014 1418   PROTEINUR NEGATIVE 08/06/2015 1956   PROTEINUR Negative 01/31/2014 1418   UROBILINOGEN 1.0 08/15/2014 1236   NITRITE NEGATIVE 08/06/2015 1956   NITRITE Negative 01/31/2014 1418   LEUKOCYTESUR NEGATIVE 08/06/2015 1956   LEUKOCYTESUR Negative 01/31/2014 1418   Sepsis Labs: @LABRCNTIP (procalcitonin:4,lacticidven:4)  )No results found for this or any previous visit (from the  past 240 hour(s)).    Anti-infectives    Start     Dose/Rate Route Frequency Ordered Stop   08/07/15 1130  amoxicillin-clavulanate (AUGMENTIN) 875-125 MG per tablet 1 tablet     1 tablet Oral Every 12 hours 08/07/15 1119         Radiology Studies: Dg Chest 2 View  08/06/2015  CLINICAL DATA:  Altered mental status, hypertension, coronary artery disease, hyperlipidemia, dementia EXAM: CHEST  2 VIEW COMPARISON:  08/15/2014 FINDINGS: Enlargement of cardiac silhouette. Prominent superior mediastinum which may be accentuated by lordotic technique. Mediastinal contours and pulmonary vascularity otherwise normal. Chronic bronchitic interstitial changes similar to prior study. No definite acute infiltrate, pleural effusion or pneumothorax. LEFT shoulder prosthesis. IMPRESSION: Enlargement of cardiac silhouette. Chronic bronchitic interstitial changes without definite acute infiltrate. Electronically Signed   By: Lavonia Dana M.D.   On: 08/06/2015 20:53   Ct Head Wo Contrast  08/06/2015  CLINICAL DATA:  Altered  mental status. Increased confusion around noon today. EXAM: CT HEAD WITHOUT CONTRAST TECHNIQUE: Contiguous axial images were obtained from the base of the skull through the vertex without intravenous contrast. COMPARISON:  01/31/2014 FINDINGS: Diffuse cerebral atrophy. Ventricular dilatation consistent with central atrophy. Low-attenuation changes throughout the deep white matter consistent small vessel ischemia. No change in pattern since previous study. All all No mass effect or midline shift. No abnormal extra-axial fluid collections. Gray-white matter junctions are distinct. Basal cisterns are not effaced. No evidence of acute intracranial hemorrhage. No depressed skull fractures. Mild mucosal thickening in the paranasal sinuses. No acute air-fluid levels. Mastoid air cells are not opacified. IMPRESSION: No acute intracranial abnormalities. Chronic atrophy and small vessel ischemic changes.  Electronically Signed   By: Lucienne Capers M.D.   On: 08/06/2015 21:39   Mr Brain Wo Contrast (neuro Protocol)  08/07/2015  CLINICAL DATA:  Initial evaluation for acute altered mental status, slurred speech. EXAM: MRI HEAD WITHOUT CONTRAST TECHNIQUE: Multiplanar, multiecho pulse sequences of the brain and surrounding structures were obtained without intravenous contrast. COMPARISON:  Prior CT from 08/06/2015. FINDINGS: Diffuse prominence of the CSF containing spaces is compatible with generalized age-related cerebral atrophy. Patchy T2/FLAIR hyperintensity within the periventricular and deep white matter both cerebral hemispheres most consistent with chronic small vessel ischemic disease, moderate nature. No abnormal foci of restricted diffusion to suggest acute intracranial infarct. Gray-white matter differentiation maintained. Major intracranial vascular flow voids are preserved. No acute or chronic intracranial hemorrhage. Probable small remote lacunar infarction within the left basal ganglia noted. No mass lesion, midline shift, or mass effect. Ventricular prominence related to global parenchymal volume loss present without hydrocephalus. No extra-axial fluid collection. Major dural sinuses are grossly patent. Craniocervical junction within normal limits. Visualized upper cervical spine without acute abnormality or significant stenosis. Pituitary gland within normal limits. No acute abnormality about the orbits. Scattered mucosal thickening within the paranasal sinuses. Small fluid level within the right sphenoid sinus. Trace opacity within left mastoid air cells. Inner ear structures grossly normal. Bone marrow signal intensity within normal limits. No scalp soft tissue abnormality. IMPRESSION: 1. No acute intracranial process identified. 2. Moderate generalized age-related cerebral atrophy with chronic small vessel ischemic disease. 3. Mild inflammatory paranasal sinus disease. Fluid level within the  right sphenoid sinus suggest acute sinusitis. Electronically Signed   By: Jeannine Boga M.D.   On: 08/07/2015 06:43        Scheduled Meds: . amoxicillin-clavulanate  1 tablet Oral Q12H  . aspirin  325 mg Oral Daily  . calcium-vitamin D  1 tablet Oral BID WC  . cholecalciferol  1,000 Units Oral Daily  . divalproex  125 mg Oral BID  . enoxaparin (LOVENOX) injection  40 mg Subcutaneous Daily  . metoprolol  25 mg Oral BID  . polyethylene glycol  17 g Oral Daily  . pravastatin  20 mg Oral q1800   Continuous Infusions: . sodium chloride 75 mL/hr at 08/07/15 0000        Time spent: 35 min    [place], DO [facility] Pager [phone]  If 7PM-7AM, please contact night-coverage [email or web address] Password [email or web address] 08/07/2015, 1:57 PM

## 2015-08-07 NOTE — Care Management Obs Status (Signed)
[place] NOTIFICATION   Patient Details  Name: Karla Alexander MRN: [id number] Date of Birth: 12-Dec-1932   Medicare Observation Status Notification Given:  Yes (MRI negative)    Pollie Friar, RN 08/07/2015, 3:05 PM

## 2015-08-07 NOTE — Care Management Note (Signed)
Case Management Note  Patient Details  Name: Karla Alexander MRN: [id number] Date of Birth: 1932-08-06  Subjective/Objective:   Pt in with acute encephalopathy. She is from [facility].                Action/Plan: Continued medical work up. CM following for d/c needs.   Expected Discharge Date:                  Expected Discharge Plan:     In-House Referral:     Discharge planning Services     Post Acute Care Choice:    Choice offered to:     DME Arranged:    DME Agency:     HH Arranged:    HH Agency:     Status of Service:  In process, will continue to follow  Medicare Important Message Given:    Date Medicare IM Given:    Medicare IM give by:    Date Additional Medicare IM Given:    Additional Medicare Important Message give by:     If discussed at [place] of Stay Meetings, dates discussed:    Additional Comments:  Pollie Friar, RN 08/07/2015, 10:30 AM

## 2015-08-07 NOTE — Progress Notes (Signed)
Echocardiogram 2D Echocardiogram has been performed.  Karla Alexander 08/07/2015, 9:44 AM

## 2015-08-07 NOTE — Progress Notes (Signed)
   08/07/15 1120  What Happened  Was fall witnessed? No  Was patient injured? No  Patient found on floor  Found by Staff-comment  Stated prior activity other (comment) (patient was getting out of bed)  Follow Up  MD notified Dr. Eliseo Squires  Time MD notified [phone]  Family notified No- patient refusal  Additional tests No  Simple treatment Dressing  Progress note created (see row info) Yes  Adult Fall Risk Assessment  Risk Factor Category (scoring not indicated) History of more than one fall within 6 months before admission (document High fall risk)  Patient's Fall Risk High Fall Risk (>13 points)  Adult Fall Risk Interventions  Required Bundle Interventions *See Row Information* High fall risk - low, moderate, and high requirements implemented  Additional Interventions Individualized elimination schedule  Fall with Injury Screening  Risk For Fall Injury- See Row Information  Nurse judgement  Intervention(s) for 2 or more risk criteria identified Gait Belt  Neurological  Level of Consciousness Alert  Orientation Level Oriented to place;Oriented to person;Disoriented to situation  Cognition Appropriate at baseline  Speech Clear  R Pupil Size (mm) 3  R Pupil Reaction Brisk  L Pupil Size (mm) 3  L Pupil Reaction Brisk  Facial Symmetry Symmetrical  R Hand Grip Present;Weak  L Hand Grip Present;Weak   Right Pronator Drift Absent  Left Pronator Drift Absent  R Foot Dorsiflexion Present;Weak  L Foot Dorsiflexion Present;Weak  R Foot Plantar Flexion Present;Weak  L Foot Plantar Flexion Present;Weak  Glasgow Coma Scale  Eye Opening 4  Best Verbal Response (NON-intubated) 4  Best Motor Response 6  Glasgow Coma Scale Score 14  Musculoskeletal  Musculoskeletal (WDL) X  Generalized Weakness Yes  Weight Bearing Restrictions No  Integumentary  Integumentary (WDL) WDL    Patient was left sitting on side of bed with no bed alarm, she attempted to stand and went down on one knee when a  staff member found her.

## 2015-08-08 DIAGNOSIS — E039 Hypothyroidism, unspecified: Secondary | ICD-10-CM | POA: Diagnosis not present

## 2015-08-08 DIAGNOSIS — F0390 Unspecified dementia without behavioral disturbance: Secondary | ICD-10-CM | POA: Diagnosis not present

## 2015-08-08 DIAGNOSIS — G9340 Encephalopathy, unspecified: Secondary | ICD-10-CM | POA: Diagnosis not present

## 2015-08-08 DIAGNOSIS — I10 Essential (primary) hypertension: Secondary | ICD-10-CM | POA: Diagnosis not present

## 2015-08-08 LAB — URINE CULTURE: CULTURE: NO GROWTH

## 2015-08-08 LAB — CBC
HEMATOCRIT: 34.7 % — AB (ref 36.0–46.0)
HEMOGLOBIN: 11.7 g/dL — AB (ref 12.0–15.0)
MCH: 31 pg (ref 26.0–34.0)
MCHC: 33.7 g/dL (ref 30.0–36.0)
MCV: 92 fL (ref 78.0–100.0)
Platelets: 182 10*3/uL (ref 150–400)
RBC: 3.77 MIL/uL — ABNORMAL LOW (ref 3.87–5.11)
RDW: 12.5 % (ref 11.5–15.5)
WBC: 5.1 10*3/uL (ref 4.0–10.5)

## 2015-08-08 LAB — BASIC METABOLIC PANEL
Anion gap: 13 (ref 5–15)
CHLORIDE: 103 mmol/L (ref 101–111)
CO2: 26 mmol/L (ref 22–32)
CREATININE: 0.99 mg/dL (ref 0.44–1.00)
Calcium: 9.5 mg/dL (ref 8.9–10.3)
GFR calc non Af Amer: 52 mL/min — ABNORMAL LOW (ref >60)
Glucose, Bld: 98 mg/dL (ref 65–99)
Potassium: 3.1 mmol/L — ABNORMAL LOW (ref 3.5–5.1)
Sodium: 142 mmol/L (ref 135–145)

## 2015-08-08 LAB — LIPID PANEL
CHOLESTEROL: 143 mg/dL (ref 0–200)
HDL: 33 mg/dL — AB (ref >40)
LDL Cholesterol: 95 mg/dL (ref 0–99)
Total CHOL/HDL Ratio: 4.3 RATIO
Triglycerides: 76 mg/dL (ref <150)
VLDL: 15 mg/dL (ref 0–40)

## 2015-08-08 MED ORDER — HALOPERIDOL LACTATE 5 MG/ML IJ SOLN
1.0000 mg | Freq: Once | INTRAMUSCULAR | Status: AC
  Administered 2015-08-08: 1 mg via INTRAVENOUS
  Filled 2015-08-08: qty 1

## 2015-08-08 MED ORDER — LEVOTHYROXINE SODIUM 25 MCG PO TABS
68.5000 ug | ORAL_TABLET | Freq: Every day | ORAL | Status: DC
  Administered 2015-08-09: 68.5 ug via ORAL
  Filled 2015-08-08: qty 1

## 2015-08-08 MED ORDER — LEVOTHYROXINE SODIUM 75 MCG PO TABS: 75.0000 ug | ORAL_TABLET | Freq: Every day | ORAL | Status: DC

## 2015-08-08 MED ORDER — SENNOSIDES-DOCUSATE SODIUM 8.6-50 MG PO TABS
2.0000 | ORAL_TABLET | Freq: Every evening | ORAL | Status: DC | PRN
  Filled 2015-08-08: qty 2

## 2015-08-08 MED ORDER — QUETIAPINE FUMARATE 25 MG PO TABS
12.5000 mg | ORAL_TABLET | Freq: Every day | ORAL | Status: DC
  Administered 2015-08-08: 12.5 mg via ORAL
  Filled 2015-08-08: qty 1

## 2015-08-08 MED ORDER — POTASSIUM CHLORIDE CRYS ER 20 MEQ PO TBCR
40.0000 meq | EXTENDED_RELEASE_TABLET | Freq: Once | ORAL | Status: AC
  Administered 2015-08-08: 40 meq via ORAL
  Filled 2015-08-08: qty 2

## 2015-08-08 NOTE — Progress Notes (Signed)
PROGRESS NOTE    Karla B Dominique  [id number] DOB: 1933/02/01 DOA: 08/06/2015 PCP: Radene Gunning, MD   Outpatient Specialists:    Brief Narrative:  Karla Alexander is a 82 y.o. female with medical history significant of depression, chronic kidney disease stage II, anemia, hypertension, hyperlipidemia and chronic pain was brought from the assisted living facility after patient was found to be confused and having some slurred speech. Patient on exam was oriented to name and month. Follows commands and moves all extremities. As per the ER physician who had talked with the nurse at the facility patient was confused which was sudden in onset and has some slurred speech. CT of the head was unremarkable. Ammonia levels were within acceptable limits. TSH was on the low side. On exam patient is able to move all extremities.  Per family, she is ward of the state now.      Assessment & Plan:   Principal Problem:   Acute encephalopathy Active Problems:   Hypothyroidism   HLD (hyperlipidemia)   Depression   Hypertension   Dementia   Acute encephalopathy - cause not clear- but appears resolved--has recent diagnosis of dementia -MRI brain negative -echo/carotid-- on 325 ASA LDL 95 - Depakote level ok -TSH is mildly on the low side: held for 2 days. Resume -Ammonia levels are normal.  -RPR- non- reactive -PT/OT eval -U/A negatiave Hypoxia resolved- 96% on RA  Baseline dementia -unsure of baseline mental status other than oriented to person/place which she is now -patient is impulsive  Hypothyroidism - TSH in the lower side  -restart at lower dose  .Chronic kidney disease stage II - creatinine appears to be at baseline.   Chronic pain - continue when necessary pain medications.   Hyperlipidemia on statins.  Hypertension - will hold off hydrochlorothiazide and gently hydrate and continue metoprolol.  Depression on Depakote.  Hypokalemia -replete   DVT prophylaxis:   Lovenox   Code Status: Full Code   Family Communication: Niece says patient is a ward of the state currently  Disposition Plan:  From [facility] -await PT eval   Consultants:   Social services  Procedures:        Subjective: Very much impulsive  Objective: Filed Vitals:   08/07/15 2101 08/07/15 2228 08/08/15 0546 08/08/15 0957  BP: 140/59 148/62 101/85 119/83  Pulse: 72 71 80 80  Temp: 98.7 F (37.1 C)  97.4 F (36.3 C) 98.5 F (36.9 C)  TempSrc: Oral  Oral Oral  Resp: 18  18 20   Height:      Weight:      SpO2: 95%  93% 98%    Intake/Output Summary (Last 24 hours) at 08/08/15 1706 Last data filed at 08/08/15 1300  Gross per 24 hour  Intake    360 ml  Output      0 ml  Net    360 ml   Filed Weights   08/06/15 1853  Weight: 72.576 kg (160 lb)    Examination:  General exam: Appears calm and comfortable- confused but pleasant Respiratory system: Clear to auscultation. Respiratory effort normal. Cardiovascular system: S1 & S2 heard, RRR. No JVD, murmurs, rubs, gallops or clicks. No pedal edema. Gastrointestinal system: Abdomen is nondistended, soft and nontender. No organomegaly or masses felt. Normal bowel sounds heard.   Data Reviewed: I have personally reviewed following labs and imaging studies  CBC:  Recent Labs Lab 08/06/15 1958 08/07/15 0435 08/08/15 0710  WBC 4.3 4.7 5.1  NEUTROABS 2.0  --   --  HGB 12.0 11.6* 11.7*  HCT 36.9 35.5* 34.7*  MCV 94.1 93.9 92.0  PLT 196 179 [id number]   Basic Metabolic Panel:  Recent Labs Lab 08/06/15 1958 08/07/15 0435 08/08/15 0710  NA 138 142 142  K 3.6 3.5 3.1*  CL 101 103 103  CO2 30 29 26   GLUCOSE 98 105* 98  BUN 11 10 <5*  CREATININE 1.14* 1.08* 0.99  CALCIUM 9.2 8.9 9.5   GFR: Estimated Creatinine Clearance: 42.6 mL/min (by C-G formula based on Cr of 0.99). Liver Function Tests:  Recent Labs Lab 08/06/15 1958 08/07/15 0435  AST 23 21  ALT 16 16  ALKPHOS 60 59  BILITOT  0.7 0.4  PROT 6.1* 5.9*  ALBUMIN 3.7 3.5   No results for input(s): LIPASE, AMYLASE in the last 168 hours.  Recent Labs Lab 08/06/15 1958  AMMONIA 29   Coagulation Profile:  Recent Labs Lab 08/06/15 1958  INR 1.04   Cardiac Enzymes:  Recent Labs Lab 08/06/15 1958  TROPONINI <0.03   BNP (last 3 results) No results for input(s): PROBNP in the last 8760 hours. HbA1C: No results for input(s): HGBA1C in the last 72 hours. CBG:  Recent Labs Lab 08/06/15 1840  GLUCAP 104*   Lipid Profile:  Recent Labs  08/08/15 0710  CHOL 143  HDL 33*  LDLCALC 95  TRIG 76  CHOLHDL 4.3   Thyroid Function Tests:  Recent Labs  08/06/15 1958  TSH 0.247*   Anemia Panel: No results for input(s): VITAMINB12, FOLATE, FERRITIN, TIBC, IRON, RETICCTPCT in the last 72 hours. Urine analysis:    Component Value Date/Time   COLORURINE YELLOW 08/06/2015 1956   COLORURINE Yellow 01/31/2014 [place] 08/06/2015 1956   APPEARANCEUR Clear 01/31/2014 1418   LABSPEC 1.016 08/06/2015 1956   LABSPEC 1.013 01/31/2014 1418   PHURINE 7.5 08/06/2015 1956   PHURINE 6.0 01/31/2014 1418   GLUCOSEU NEGATIVE 08/06/2015 1956   GLUCOSEU Negative 01/31/2014 1418   HGBUR NEGATIVE 08/06/2015 1956   HGBUR Negative 01/31/2014 1418   BILIRUBINUR NEGATIVE 08/06/2015 1956   BILIRUBINUR Negative 01/31/2014 1418   KETONESUR NEGATIVE 08/06/2015 1956   KETONESUR Negative 01/31/2014 1418   PROTEINUR NEGATIVE 08/06/2015 1956   PROTEINUR Negative 01/31/2014 1418   UROBILINOGEN 1.0 08/15/2014 1236   NITRITE NEGATIVE 08/06/2015 1956   NITRITE Negative 01/31/2014 1418   LEUKOCYTESUR NEGATIVE 08/06/2015 1956   LEUKOCYTESUR Negative 01/31/2014 1418   Sepsis Labs: @LABRCNTIP (procalcitonin:4,lacticidven:4)  ) Recent Results (from the past 240 hour(s))  Urine culture     Status: None   Collection Time: 08/06/15  7:58 PM  Result Value Ref Range Status   Specimen Description URINE,  CATHETERIZED  Final   Special Requests NONE  Final   Culture NO GROWTH  Final   Report Status 08/08/2015 FINAL  Final      Anti-infectives    Start     Dose/Rate Route Frequency Ordered Stop   08/07/15 1130  amoxicillin-clavulanate (AUGMENTIN) 875-125 MG per tablet 1 tablet     1 tablet Oral Every 12 hours 08/07/15 1119         Radiology Studies: Dg Chest 2 View  08/06/2015  CLINICAL DATA:  Altered mental status, hypertension, coronary artery disease, hyperlipidemia, dementia EXAM: CHEST  2 VIEW COMPARISON:  08/15/2014 FINDINGS: Enlargement of cardiac silhouette. Prominent superior mediastinum which may be accentuated by lordotic technique. Mediastinal contours and pulmonary vascularity otherwise normal. Chronic bronchitic interstitial changes similar to prior study. No definite acute infiltrate, pleural effusion  or pneumothorax. LEFT shoulder prosthesis. IMPRESSION: Enlargement of cardiac silhouette. Chronic bronchitic interstitial changes without definite acute infiltrate. Electronically Signed   By: Lavonia Dana M.D.   On: 08/06/2015 20:53   Ct Head Wo Contrast  08/06/2015  CLINICAL DATA:  Altered mental status. Increased confusion around noon today. EXAM: CT HEAD WITHOUT CONTRAST TECHNIQUE: Contiguous axial images were obtained from the base of the skull through the vertex without intravenous contrast. COMPARISON:  01/31/2014 FINDINGS: Diffuse cerebral atrophy. Ventricular dilatation consistent with central atrophy. Low-attenuation changes throughout the deep white matter consistent small vessel ischemia. No change in pattern since previous study. All all No mass effect or midline shift. No abnormal extra-axial fluid collections. Gray-white matter junctions are distinct. Basal cisterns are not effaced. No evidence of acute intracranial hemorrhage. No depressed skull fractures. Mild mucosal thickening in the paranasal sinuses. No acute air-fluid levels. Mastoid air cells are not opacified.  IMPRESSION: No acute intracranial abnormalities. Chronic atrophy and small vessel ischemic changes. Electronically Signed   By: Lucienne Capers M.D.   On: 08/06/2015 21:39   Mr Brain Wo Contrast (neuro Protocol)  08/07/2015  CLINICAL DATA:  Initial evaluation for acute altered mental status, slurred speech. EXAM: MRI HEAD WITHOUT CONTRAST TECHNIQUE: Multiplanar, multiecho pulse sequences of the brain and surrounding structures were obtained without intravenous contrast. COMPARISON:  Prior CT from 08/06/2015. FINDINGS: Diffuse prominence of the CSF containing spaces is compatible with generalized age-related cerebral atrophy. Patchy T2/FLAIR hyperintensity within the periventricular and deep white matter both cerebral hemispheres most consistent with chronic small vessel ischemic disease, moderate nature. No abnormal foci of restricted diffusion to suggest acute intracranial infarct. Gray-white matter differentiation maintained. Major intracranial vascular flow voids are preserved. No acute or chronic intracranial hemorrhage. Probable small remote lacunar infarction within the left basal ganglia noted. No mass lesion, midline shift, or mass effect. Ventricular prominence related to global parenchymal volume loss present without hydrocephalus. No extra-axial fluid collection. Major dural sinuses are grossly patent. Craniocervical junction within normal limits. Visualized upper cervical spine without acute abnormality or significant stenosis. Pituitary gland within normal limits. No acute abnormality about the orbits. Scattered mucosal thickening within the paranasal sinuses. Small fluid level within the right sphenoid sinus. Trace opacity within left mastoid air cells. Inner ear structures grossly normal. Bone marrow signal intensity within normal limits. No scalp soft tissue abnormality. IMPRESSION: 1. No acute intracranial process identified. 2. Moderate generalized age-related cerebral atrophy with chronic  small vessel ischemic disease. 3. Mild inflammatory paranasal sinus disease. Fluid level within the right sphenoid sinus suggest acute sinusitis. Electronically Signed   By: Jeannine Boga M.D.   On: 08/07/2015 06:43        Scheduled Meds: . amoxicillin-clavulanate  1 tablet Oral Q12H  . aspirin  325 mg Oral Daily  . calcium-vitamin D  1 tablet Oral BID WC  . cholecalciferol  1,000 Units Oral Daily  . divalproex  125 mg Oral BID  . enoxaparin (LOVENOX) injection  40 mg Subcutaneous Daily  . metoprolol  25 mg Oral BID  . polyethylene glycol  17 g Oral Daily  . pravastatin  20 mg Oral q1800  . QUEtiapine  12.5 mg Oral QHS   Continuous Infusions:        Time spent: 35 min    [place], DO [facility] Pager [phone]  If 7PM-7AM, please contact night-coverage [email or web address] Password [email or web address] 08/08/2015, 5:06 PM

## 2015-08-08 NOTE — Progress Notes (Signed)
OT Cancellation Note  Patient Details Name: Karla Alexander MRN: [id number] DOB: [birth year]   Cancelled Treatment:    Reason Eval/Treat Not Completed: Fatigue/lethargy limiting ability to participate. Pt able to arouse with verbal and tactile stimulus but only able to maintain arousal for a few seconds before closing eyes and going back to sleep. Repositioned pt in bed with assist of sitter; pt continued to be lethargic. Will follow up for OT eval as time allows.  Binnie Kand M.S., OTR/L Pager: [phone]  08/08/2015, 2:23 PM

## 2015-08-08 NOTE — Progress Notes (Signed)
CSW left a message for [place] DSS case worker Garment/textile technologist Idolina Primer [phone]).  Percell Locus Johnathon Mittal LCSWA [phone]

## 2015-08-08 NOTE — Progress Notes (Signed)
Patient toileted and returned to bed. 1 mg haldol administered. Call light placed within patients reach, bed alarm activated, RN will continue to monitor.

## 2015-08-08 NOTE — Evaluation (Signed)
Physical Therapy Evaluation Patient Details Name: [place] B [place] MRN: [id number] DOB: 03/30/1932 Today's Date: 08/08/2015   History of Present Illness  Pt is an 82 y.o. female with medical history significant of depression, chronic kidney disease stage II, anemia, hypertension, hyperlipidemia and chronic pain was brought from the assisted living facility after patient was found to be confused and having some slurred speech.  Clinical Impression  Pt admitted with above diagnosis. Pt currently with functional limitations due to the deficits listed below (see PT Problem List). On eval, pt required min assist for supine to sit, min guard assist transfers, and min guard assist ambulation 10 feet with RW. Gait distance limited by pt having diarrhea. RN reports pt has had 2 recent falls due to knee buckling. No buckling noted during eval.  Pt will benefit from skilled PT to increase their independence and safety with mobility to allow discharge to the venue listed below.       Follow Up Recommendations Home health PT;Supervision for mobility/OOB    Equipment Recommendations  Rolling walker with 5" wheels    Recommendations for Other Services       Precautions / Restrictions Precautions Precautions: Fall Restrictions Weight Bearing Restrictions: No      Mobility  Bed Mobility Overal bed mobility: Needs Assistance Bed Mobility: Supine to Sit     Supine to sit: Min assist;HOB elevated     General bed mobility comments: +rail  Transfers Overall transfer level: Needs assistance Equipment used: Rolling walker (2 wheeled) Transfers: Sit to/from [facility] Sit to Stand: Min guard Stand pivot transfers: Min guard       General transfer comment: min guard for safety only  Ambulation/Gait Ambulation/Gait assistance: Min guard Ambulation Distance (Feet): 10 Feet Assistive device: Rolling walker (2 wheeled) Gait Pattern/deviations: Step-through pattern   Gait  velocity interpretation: at or above normal speed for age/gender General Gait Details: Gait limited to in room due to pt with diarrhea.  Stairs            Wheelchair Mobility    Modified Rankin (Stroke Patients Only)       Balance Overall balance assessment: History of Falls;Needs assistance Sitting-balance support: No upper extremity supported;Feet supported Sitting balance-Leahy Scale: Good     Standing balance support: Bilateral upper extremity supported;During functional activity Standing balance-Leahy Scale: Fair                               Pertinent Vitals/Pain Pain Assessment: No/denies pain    Home Living Family/patient expects to be discharged to:: Assisted living                 Additional Comments: Lives at [facility]    Prior Function Level of Independence: Independent with assistive device(s)         Comments: Cane or RW for ambulation     Hand Dominance   Dominant Hand: Right    Extremity/Trunk Assessment   Upper Extremity Assessment: Defer to OT evaluation           Lower Extremity Assessment: Overall WFL for tasks assessed      Cervical / Trunk Assessment: Normal  Communication   Communication: No difficulties  Cognition Arousal/Alertness: Awake/alert Behavior During Therapy: WFL for tasks assessed/performed Overall Cognitive Status: No family/caregiver present to determine baseline cognitive functioning Area of Impairment: Safety/judgement     Memory: Decreased short-term memory   Safety/Judgement: Decreased awareness of safety  General Comments: Pt A and O x 4. She does present with deficits in safety and answers all questions appropriately and follows all commands.    General Comments      Exercises        Assessment/Plan    PT Assessment Patient needs continued PT services  PT Diagnosis Altered mental status;Difficulty walking   PT Problem List Decreased mobility;Decreased  safety awareness;Decreased balance  PT Treatment Interventions DME instruction;Gait training;Functional mobility training;Therapeutic activities;Therapeutic exercise;Patient/family education;Balance training   PT Goals (Current goals can be found in the Care Plan section) Acute Rehab PT Goals Patient Stated Goal: not stated PT Goal Formulation: With patient Time For Goal Achievement: 08/22/15 Potential to Achieve Goals: Good    Frequency Min 3X/week   Barriers to discharge        Co-evaluation               End of Session Equipment Utilized During Treatment: Gait belt Activity Tolerance: Patient tolerated treatment well Patient left: Other (comment);with call bell/phone within reach;with nursing/sitter in room (in bathroom with sitter present) Nurse Communication: Mobility status    Functional Assessment Tool Used: clinical judgement Functional Limitation: Mobility: Walking and moving around Mobility: Walking and Moving Around Current Status [id number]): At least 1 percent but less than 20 percent impaired, limited or restricted Mobility: Walking and Moving Around Goal Status [phone]): At least 1 percent but less than 20 percent impaired, limited or restricted    Time: 1120-1144 PT Time Calculation (min) (ACUTE ONLY): 24 min   Charges:   PT Evaluation $PT Eval Moderate Complexity: 1 Procedure PT Treatments $Therapeutic Activity: 8-22 mins   PT G Codes:   PT G-Codes **NOT FOR INPATIENT CLASS** Functional Assessment Tool Used: clinical judgement Functional Limitation: Mobility: Walking and moving around Mobility: Walking and Moving Around Current Status [id number]): At least 1 percent but less than 20 percent impaired, limited or restricted Mobility: Walking and Moving Around Goal Status [phone]): At least 1 percent but less than 20 percent impaired, limited or restricted    Lorriane Shire 08/08/2015, 12:25 PM

## 2015-08-09 DIAGNOSIS — G9340 Encephalopathy, unspecified: Secondary | ICD-10-CM | POA: Diagnosis not present

## 2015-08-09 DIAGNOSIS — K219 Gastro-esophageal reflux disease without esophagitis: Secondary | ICD-10-CM | POA: Diagnosis not present

## 2015-08-09 DIAGNOSIS — F329 Major depressive disorder, single episode, unspecified: Secondary | ICD-10-CM | POA: Diagnosis not present

## 2015-08-09 DIAGNOSIS — N182 Chronic kidney disease, stage 2 (mild): Secondary | ICD-10-CM | POA: Diagnosis not present

## 2015-08-09 DIAGNOSIS — E039 Hypothyroidism, unspecified: Secondary | ICD-10-CM | POA: Diagnosis not present

## 2015-08-09 DIAGNOSIS — I5032 Chronic diastolic (congestive) heart failure: Secondary | ICD-10-CM

## 2015-08-09 DIAGNOSIS — J329 Chronic sinusitis, unspecified: Secondary | ICD-10-CM | POA: Diagnosis not present

## 2015-08-09 DIAGNOSIS — G894 Chronic pain syndrome: Secondary | ICD-10-CM | POA: Diagnosis not present

## 2015-08-09 DIAGNOSIS — R4182 Altered mental status, unspecified: Secondary | ICD-10-CM | POA: Diagnosis not present

## 2015-08-09 DIAGNOSIS — I10 Essential (primary) hypertension: Secondary | ICD-10-CM | POA: Diagnosis not present

## 2015-08-09 DIAGNOSIS — E785 Hyperlipidemia, unspecified: Secondary | ICD-10-CM | POA: Diagnosis not present

## 2015-08-09 DIAGNOSIS — I129 Hypertensive chronic kidney disease with stage 1 through stage 4 chronic kidney disease, or unspecified chronic kidney disease: Secondary | ICD-10-CM | POA: Diagnosis not present

## 2015-08-09 DIAGNOSIS — I2510 Atherosclerotic heart disease of native coronary artery without angina pectoris: Secondary | ICD-10-CM | POA: Diagnosis not present

## 2015-08-09 DIAGNOSIS — M25519 Pain in unspecified shoulder: Secondary | ICD-10-CM | POA: Diagnosis not present

## 2015-08-09 DIAGNOSIS — D649 Anemia, unspecified: Secondary | ICD-10-CM | POA: Diagnosis not present

## 2015-08-09 DIAGNOSIS — F0390 Unspecified dementia without behavioral disturbance: Secondary | ICD-10-CM | POA: Diagnosis not present

## 2015-08-09 DIAGNOSIS — I130 Hypertensive heart and chronic kidney disease with heart failure and stage 1 through stage 4 chronic kidney disease, or unspecified chronic kidney disease: Secondary | ICD-10-CM | POA: Diagnosis not present

## 2015-08-09 LAB — CBC
HCT: 38.5 % (ref 36.0–46.0)
HEMOGLOBIN: 12.3 g/dL (ref 12.0–15.0)
MCH: 30.6 pg (ref 26.0–34.0)
MCHC: 31.9 g/dL (ref 30.0–36.0)
MCV: 95.8 fL (ref 78.0–100.0)
Platelets: 184 10*3/uL (ref 150–400)
RBC: 4.02 MIL/uL (ref 3.87–5.11)
RDW: 12.8 % (ref 11.5–15.5)
WBC: 5.7 10*3/uL (ref 4.0–10.5)

## 2015-08-09 LAB — BASIC METABOLIC PANEL
Anion gap: 9 (ref 5–15)
BUN: 6 mg/dL (ref 6–20)
CHLORIDE: 107 mmol/L (ref 101–111)
CO2: 28 mmol/L (ref 22–32)
Calcium: 9.7 mg/dL (ref 8.9–10.3)
Creatinine, Ser: 0.94 mg/dL (ref 0.44–1.00)
GFR, EST NON AFRICAN AMERICAN: 55 mL/min — AB (ref >60)
Glucose, Bld: 88 mg/dL (ref 65–99)
POTASSIUM: 3.6 mmol/L (ref 3.5–5.1)
SODIUM: 144 mmol/L (ref 135–145)

## 2015-08-09 MED ORDER — AMOXICILLIN-POT CLAVULANATE 875-125 MG PO TABS: 1.0000 | ORAL_TABLET | Freq: Two times a day (BID) | ORAL | Status: DC

## 2015-08-09 MED ORDER — QUETIAPINE FUMARATE 25 MG PO TABS: 12.5000 mg | ORAL_TABLET | Freq: Every day | ORAL | Status: DC

## 2015-08-09 MED ORDER — LEVOTHYROXINE SODIUM 137 MCG PO TABS: 68.5000 ug | ORAL_TABLET | Freq: Every day | ORAL | Status: DC

## 2015-08-09 NOTE — Progress Notes (Signed)
Patient is being d/c to an assisted living. Report called to the receiving facility.

## 2015-08-09 NOTE — Discharge Summary (Signed)
Physician Discharge Summary  Karla B Davies [id number] DOB: [birth year] DOA: 08/06/2015  PCP: Radene Gunning, MD  Admit date: 08/06/2015 Discharge date: 08/09/2015   Recommendations for Outpatient Follow-Up:   1. augmentin 4 more days 2. TSH/free t4- 6 weeks for slight medication adjustment 3. PT 4. Supervision 5. Daily weights 6. Cbc/bmp 1 week   Discharge Diagnosis:   Principal Problem:   Acute encephalopathy Active Problems:   Hypothyroidism   HLD (hyperlipidemia)   Depression   Hypertension   Dementia   Chronic diastolic congestive heart failure [facility])   Discharge disposition:  ALF  Discharge Condition: Improved.  Diet recommendation: Low sodium, heart healthy  Wound care: None.   History of Present Illness:   Karla Alexander is a 82 y.o. female with medical history significant of depression, chronic kidney disease stage II, anemia, hypertension, hyperlipidemia and chronic pain was brought from the assisted living facility after patient was found to be confused and having some slurred speech. Patient on exam was oriented to name and month. Follows commands and moves all extremities. As per the ER physician who had talked with the nurse at the facility patient was confused which was sudden in onset and has some slurred speech. CT of the head was unremarkable. Ammonia levels were within acceptable limits. TSH was on the low side. On exam patient is able to move all extremities. Patient is mildly febrile and chest x-ray showed bronchitis changes with some large cardiac silhouette with no definite acute changes and UA is unremarkable. Patient has been admitted for acute encephalopathy. MRI brain is pending.    Hospital Course by Problem:   Acute encephalopathy - cause not clear- but appears resolved-- ?TIA vs acute sinusitis -MRI brain negative -echo-- on 325 ASA:  Study Conclusions  - Left ventricle: The cavity size was normal. Wall thickness was   normal. Systolic function was normal. The estimated ejection  fraction was in the range of 60% to 65%. Wall motion was normal;  there were no regional wall motion abnormalities. Features are  consistent with a pseudonormal left ventricular filling pattern,  with concomitant abnormal relaxation and increased filling  pressure (grade 2 diastolic dysfunction). - Aortic valve: Trileaflet; mildly thickened, mildly calcified  leaflets. - Aorta: The aorta was mildly calcified. - Mitral valve: Calcified annulus. Mildly thickened leaflets .  There was mild regurgitation. LDL only mildly elevated at 95 - Depakote level ok -TSH is mildly on the low side: small adjustment with outpatient follow up -Ammonia levels are normal.  -RPR- non- reactive -PT/OT eval -U/A negative Hypoxia - 96% on RA (d dimer not significantly elevated)  Baseline dementia -unsure of baseline mental status other than oriented to person/place which she is now -added low dose seroquel at night-- patient appears improved  Hypothyroidism - TSH in the lower side  -adusted synthroid  .Chronic kidney disease stage II -Cr stable  Chronic pain - continue when necessary pain medications. -avoid meds to alter mentation  Hyperlipidemia on statins.  Hypertension  -resume home meds  Depression on Depakote.    Medical Consultants:    None.   Discharge Exam:   Filed Vitals:   08/09/15 0454 08/09/15 1019  BP: 121/60 126/51  Pulse: 65 63  Temp: 98.6 F (37 C) 98 F (36.7 C)  Resp: 17 20   Filed Vitals:   08/08/15 2227 08/09/15 0144 08/09/15 0454 08/09/15 1019  BP: 111/99 121/50 121/60 126/51  Pulse: 72 71 65 63  Temp: 98.1 F (36.7 C) 98.7  F (37.1 C) 98.6 F (37 C) 98 F (36.7 C)  TempSrc: Oral Oral Oral Oral  Resp: 20 18 17 20   Height:      Weight:      SpO2: 100% 98% 94% 98%    Gen:  NAD, A+Ox3    The results of significant diagnostics from this hospitalization (including imaging,  microbiology, ancillary and laboratory) are listed below for reference.     Procedures and Diagnostic Studies:   Dg Chest 2 View  08/06/2015  CLINICAL DATA:  Altered mental status, hypertension, coronary artery disease, hyperlipidemia, dementia EXAM: CHEST  2 VIEW COMPARISON:  08/15/2014 FINDINGS: Enlargement of cardiac silhouette. Prominent superior mediastinum which may be accentuated by lordotic technique. Mediastinal contours and pulmonary vascularity otherwise normal. Chronic bronchitic interstitial changes similar to prior study. No definite acute infiltrate, pleural effusion or pneumothorax. LEFT shoulder prosthesis. IMPRESSION: Enlargement of cardiac silhouette. Chronic bronchitic interstitial changes without definite acute infiltrate. Electronically Signed   By: Lavonia Dana M.D.   On: 08/06/2015 20:53   Ct Head Wo Contrast  08/06/2015  CLINICAL DATA:  Altered mental status. Increased confusion around noon today. EXAM: CT HEAD WITHOUT CONTRAST TECHNIQUE: Contiguous axial images were obtained from the base of the skull through the vertex without intravenous contrast. COMPARISON:  01/31/2014 FINDINGS: Diffuse cerebral atrophy. Ventricular dilatation consistent with central atrophy. Low-attenuation changes throughout the deep white matter consistent small vessel ischemia. No change in pattern since previous study. All all No mass effect or midline shift. No abnormal extra-axial fluid collections. Gray-white matter junctions are distinct. Basal cisterns are not effaced. No evidence of acute intracranial hemorrhage. No depressed skull fractures. Mild mucosal thickening in the paranasal sinuses. No acute air-fluid levels. Mastoid air cells are not opacified. IMPRESSION: No acute intracranial abnormalities. Chronic atrophy and small vessel ischemic changes. Electronically Signed   By: Lucienne Capers M.D.   On: 08/06/2015 21:39   Mr Brain Wo Contrast (neuro Protocol)  08/07/2015  CLINICAL DATA:   Initial evaluation for acute altered mental status, slurred speech. EXAM: MRI HEAD WITHOUT CONTRAST TECHNIQUE: Multiplanar, multiecho pulse sequences of the brain and surrounding structures were obtained without intravenous contrast. COMPARISON:  Prior CT from 08/06/2015. FINDINGS: Diffuse prominence of the CSF containing spaces is compatible with generalized age-related cerebral atrophy. Patchy T2/FLAIR hyperintensity within the periventricular and deep white matter both cerebral hemispheres most consistent with chronic small vessel ischemic disease, moderate nature. No abnormal foci of restricted diffusion to suggest acute intracranial infarct. Gray-white matter differentiation maintained. Major intracranial vascular flow voids are preserved. No acute or chronic intracranial hemorrhage. Probable small remote lacunar infarction within the left basal ganglia noted. No mass lesion, midline shift, or mass effect. Ventricular prominence related to global parenchymal volume loss present without hydrocephalus. No extra-axial fluid collection. Major dural sinuses are grossly patent. Craniocervical junction within normal limits. Visualized upper cervical spine without acute abnormality or significant stenosis. Pituitary gland within normal limits. No acute abnormality about the orbits. Scattered mucosal thickening within the paranasal sinuses. Small fluid level within the right sphenoid sinus. Trace opacity within left mastoid air cells. Inner ear structures grossly normal. Bone marrow signal intensity within normal limits. No scalp soft tissue abnormality. IMPRESSION: 1. No acute intracranial process identified. 2. Moderate generalized age-related cerebral atrophy with chronic small vessel ischemic disease. 3. Mild inflammatory paranasal sinus disease. Fluid level within the right sphenoid sinus suggest acute sinusitis. Electronically Signed   By: Jeannine Boga M.D.   On: 08/07/2015 06:43  Labs:   Basic  Metabolic Panel:  Recent Labs Lab 08/06/15 1958 08/07/15 0435 08/08/15 0710 08/09/15 0611  NA 138 142 142 144  K 3.6 3.5 3.1* 3.6  CL 101 103 103 107  CO2 30 29 26 28   GLUCOSE 98 105* 98 88  BUN 11 10 <5* 6  CREATININE 1.14* 1.08* 0.99 0.94  CALCIUM 9.2 8.9 9.5 9.7   GFR Estimated Creatinine Clearance: 44.8 mL/min (by C-G formula based on Cr of 0.94). Liver Function Tests:  Recent Labs Lab 08/06/15 1958 08/07/15 0435  AST 23 21  ALT 16 16  ALKPHOS 60 59  BILITOT 0.7 0.4  PROT 6.1* 5.9*  ALBUMIN 3.7 3.5   No results for input(s): LIPASE, AMYLASE in the last 168 hours.  Recent Labs Lab 08/06/15 1958  AMMONIA 29   Coagulation profile  Recent Labs Lab 08/06/15 1958  INR 1.04    CBC:  Recent Labs Lab 08/06/15 1958 08/07/15 0435 08/08/15 0710 08/09/15 0611  WBC 4.3 4.7 5.1 5.7  NEUTROABS 2.0  --   --   --   HGB 12.0 11.6* 11.7* 12.3  HCT 36.9 35.5* 34.7* 38.5  MCV 94.1 93.9 92.0 95.8  PLT 196 179 182 184   Cardiac Enzymes:  Recent Labs Lab 08/06/15 1958  TROPONINI <0.03   BNP: Invalid input(s): POCBNP CBG:  Recent Labs Lab 08/06/15 1840  GLUCAP 104*   D-Dimer  Recent Labs  08/07/15 0826  DDIMER 0.56*   Hgb A1c No results for input(s): HGBA1C in the last 72 hours. Lipid Profile  Recent Labs  08/08/15 0710  CHOL 143  HDL 33*  LDLCALC 95  TRIG 76  CHOLHDL 4.3   Thyroid function studies  Recent Labs  08/06/15 1958  TSH 0.247*   Anemia work up No results for input(s): VITAMINB12, FOLATE, FERRITIN, TIBC, IRON, RETICCTPCT in the last 72 hours. Microbiology Recent Results (from the past 240 hour(s))  Urine culture     Status: None   Collection Time: 08/06/15  7:58 PM  Result Value Ref Range Status   Specimen Description URINE, CATHETERIZED  Final   Special Requests NONE  Final   Culture NO GROWTH  Final   Report Status 08/08/2015 FINAL  Final     Discharge Instructions:       Discharge Instructions    (HEART  FAILURE PATIENTS) Call MD:  Anytime you have any of the following symptoms: 1) 3 pound weight gain in 24 hours or 5 pounds in 1 week 2) shortness of breath, with or without a dry hacking cough 3) swelling in the hands, feet or stomach 4) if you have to sleep on extra pillows at night in order to breathe.    Complete by:  As directed      Diet - low sodium heart healthy    Complete by:  As directed      Discharge instructions    Complete by:  As directed   TSH/free t 4  In 6 weeks augmentin for 4 more days for sinusitis     Increase activity slowly    Complete by:  As directed             Medication List    TAKE these medications        albuterol 108 (90 Base) MCG/ACT inhaler  Commonly known as:  PROVENTIL HFA;VENTOLIN HFA  Inhale 1 puff into the lungs every 6 (six) hours as needed for wheezing or shortness of breath.  alendronate 70 MG tablet  Commonly known as:  FOSAMAX  Take 70 mg by mouth once a week. Take with a full glass of water on an empty stomach.     alum & mag hydroxide-simeth 200-200-20 MG/5ML suspension  Commonly known as:  MAALOX/MYLANTA  Take 30 mLs by mouth every 4 (four) hours as needed for indigestion or heartburn.     amoxicillin-clavulanate 875-125 MG tablet  Commonly known as:  AUGMENTIN  Take 1 tablet by mouth every 12 (twelve) hours.     aspirin 325 MG tablet  Take 325 mg by mouth daily.     Calcium Citrate-Vitamin D 250-200 MG-UNIT Tabs  Take 1 tablet by mouth 2 (two) times daily.     Cholecalciferol 1000 units capsule  Take 1,000 Units by mouth daily.     clonazePAM 0.5 MG tablet  Commonly known as:  KLONOPIN  Take 0.5 mg by mouth 2 (two) times daily as needed for anxiety.     divalproex 125 MG capsule  Commonly known as:  DEPAKOTE SPRINKLE  Take 125 mg by mouth 2 (two) times daily.     levothyroxine 137 MCG tablet  Commonly known as:  SYNTHROID, LEVOTHROID  Take 0.5 tablets (68.5 mcg total) by mouth daily before breakfast.      lovastatin 20 MG tablet  Commonly known as:  MEVACOR  Take 20 mg by mouth.     metoprolol 50 MG tablet  Commonly known as:  LOPRESSOR  Take 25 mg by mouth 2 (two) times daily.     oxyCODONE-acetaminophen 5-325 MG tablet  Commonly known as:  ROXICET  Take 1-2 tablets by mouth every 4 (four) hours as needed for severe pain.     polyethylene glycol packet  Commonly known as:  MIRALAX / GLYCOLAX  Take 17 g by mouth daily.     QUEtiapine 25 MG tablet  Commonly known as:  SEROQUEL  Take 0.5 tablets (12.5 mg total) by mouth at bedtime.     triamterene-hydrochlorothiazide 37.5-25 MG capsule  Commonly known as:  DYAZIDE  Take 1 capsule by mouth daily.       Follow-up Information    Follow up with Radene Gunning, MD In 1 week.   Specialty:  Family Medicine       Time coordinating discharge: 35 min  Signed:  Rainbow Salman Alison Stalling   [facility] 08/09/2015, 12:09 PM

## 2015-08-09 NOTE — Progress Notes (Signed)
[facility] here to take patient back to the facility.

## 2015-08-09 NOTE — NC FL2 (Signed)
[place] LEVEL OF CARE SCREENING TOOL     IDENTIFICATION  Patient Name: Karla Alexander Birthdate: 1933/01/08 Sex: female Admission Date (Current Location): 08/06/2015  [employer] and [place] Number:  Engineering geologist and Address:  [facility] [place], [place] [address] [place], [place] [address]      Provider Number: [id number]  Attending Physician Name and Address:  Geradine Girt, DO  Relative Name and Phone Number:  Milana Kidney ([place]) [phone]    Current Level of Care: Hospital Recommended Level of Care: [place] Prior Approval Number:    Date Approved/Denied:   PASRR Number:    Discharge Plan: Other (Comment) (ALF)    Current Diagnoses: Patient Active Problem List   Diagnosis Date Noted  . Chronic diastolic congestive heart failure ([place]) 08/09/2015  . Dementia 08/07/2015  . Altered mental status 08/06/2015  . Acute encephalopathy 08/06/2015  . Hypothyroidism 08/06/2015  . HLD (hyperlipidemia) 08/06/2015  . Depression 08/06/2015  . Hypertension 08/06/2015  . Proximal humerus fracture 08/23/2014  . S/P lumbar spinal fusion 05/31/2013    Orientation RESPIRATION BLADDER Height & Weight     Self, Place, Situation  Normal Continent Weight: 72.576 kg (160 lb) Height:  5\' 3"  (160 cm)  BEHAVIORAL SYMPTOMS/MOOD NEUROLOGICAL BOWEL NUTRITION STATUS      Continent Diet (Regular)  AMBULATORY STATUS COMMUNICATION OF NEEDS Skin   Supervision Verbally Normal                       Personal Care Assistance Level of Assistance  Bathing, Feeding, Dressing Bathing Assistance: Limited assistance Feeding assistance: Independent Dressing Assistance: Limited assistance     Functional Limitations Info             SPECIAL CARE FACTORS FREQUENCY  PT (By licensed PT), OT (By licensed OT)     PT Frequency: min 3x/week OT Frequency: min 2x/week            Contractures      Additional Factors Info   Code Status, Allergies Code Status Info: Full Allergies Info: Codeine           Current Medications (08/09/2015):    Discharge Medications: TAKE these medications       albuterol 108 (90 Base) MCG/ACT inhaler  Commonly known as: PROVENTIL HFA;VENTOLIN HFA  Inhale 1 puff into the lungs every 6 (six) hours as needed for wheezing or shortness of breath.     alendronate 70 MG tablet  Commonly known as: FOSAMAX  Take 70 mg by mouth once a week. Take with a full glass of water on an empty stomach.     alum & mag hydroxide-simeth 200-200-20 MG/5ML suspension  Commonly known as: MAALOX/MYLANTA  Take 30 mLs by mouth every 4 (four) hours as needed for indigestion or heartburn.     amoxicillin-clavulanate 875-125 MG tablet  Commonly known as: AUGMENTIN  Take 1 tablet by mouth every 12 (twelve) hours.     aspirin 325 MG tablet  Take 325 mg by mouth daily.     Calcium Citrate-Vitamin D 250-200 MG-UNIT Tabs  Take 1 tablet by mouth 2 (two) times daily.     Cholecalciferol 1000 units capsule  Take 1,000 Units by mouth daily.     clonazePAM 0.5 MG tablet  Commonly known as: KLONOPIN  Take 0.5 mg by mouth 2 (two) times daily as needed for anxiety.     divalproex 125 MG capsule  Commonly known  as: DEPAKOTE SPRINKLE  Take 125 mg by mouth 2 (two) times daily.     levothyroxine 137 MCG tablet  Commonly known as: SYNTHROID, LEVOTHROID  Take 0.5 tablets (68.5 mcg total) by mouth daily before breakfast.     lovastatin 20 MG tablet  Commonly known as: MEVACOR  Take 20 mg by mouth.     metoprolol 50 MG tablet  Commonly known as: LOPRESSOR  Take 25 mg by mouth 2 (two) times daily.     oxyCODONE-acetaminophen 5-325 MG tablet  Commonly known as: ROXICET  Take 1-2 tablets by mouth every 4 (four) hours as needed for severe pain.     polyethylene glycol packet  Commonly known as: MIRALAX / GLYCOLAX  Take 17  g by mouth daily.     QUEtiapine 25 MG tablet  Commonly known as: SEROQUEL  Take 0.5 tablets (12.5 mg total) by mouth at bedtime.     triamterene-hydrochlorothiazide 37.5-25 MG capsule  Commonly known as: DYAZIDE  Take 1 capsule by mouth daily.           Relevant Imaging Results:  Relevant Lab Results:   Additional Information SSN: [place], [place]

## 2015-08-09 NOTE — Evaluation (Signed)
Occupational Therapy Evaluation Patient Details Name: Karla Alexander MRN: [id number] DOB: [birth year] Today's Date: 08/09/2015    History of Present Illness Pt is an 82 y.o. female with medical history significant of depression, chronic kidney disease stage II, anemia, hypertension, hyperlipidemia and chronic pain was brought from the assisted living facility after patient was found to be confused and having some slurred speech.   Clinical Impression   Pt reports she was able to complete ADLs independently PTA; unsure of accuracy of information-no family/caregiver present to determine PLOF. Pt currently overall min guard for safety with functional mobility and ADLs. Pt planning to return to SNF [facility]) today; therefore will defer all further OT needs to next venue. Thank you for this referral.    Follow Up Recommendations  SNF;Supervision/Assistance - 24 hour    Equipment Recommendations  Other (comment) (TBD at next venue)    Recommendations for Other Services       Precautions / Restrictions Precautions Precautions: Fall Restrictions Weight Bearing Restrictions: No      Mobility Bed Mobility               General bed mobility comments: Pt OOB in chair upon arrival.  Transfers Overall transfer level: Needs assistance Equipment used: Rolling walker (2 wheeled) Transfers: Sit to/from Stand Sit to Stand: Min guard Stand pivot transfers: Min guard       General transfer comment: Min guard for safety; no physical assist required. VCs for sequencing.     Balance Overall balance assessment: Needs assistance;History of Falls Sitting-balance support: No upper extremity supported;Feet supported Sitting balance-Leahy Scale: Good     Standing balance support: No upper extremity supported;During functional activity Standing balance-Leahy Scale: Fair Standing balance comment: Pt able to stand at sink and complete grooming acvitities without UE support                            ADL Overall ADL's : Needs assistance/impaired Eating/Feeding: Set up;Sitting   Grooming: Supervision/safety;Standing;Oral care;Wash/dry hands   Upper Body Bathing: Supervision/ safety;Sitting   Lower Body Bathing: Min guard;Sit to/from stand   Upper Body Dressing : Supervision/safety;Sitting   Lower Body Dressing: Min guard;Sit to/from stand   Toilet Transfer: Min guard;Ambulation;Regular Toilet;RW   Toileting- Water quality scientist and Hygiene: Min guard;Sit to/from stand       Functional mobility during ADLs: Min guard;Rolling walker       Vision Vision Assessment?: No apparent visual deficits   Perception     Praxis      Pertinent Vitals/Pain Pain Assessment: No/denies pain     Hand Dominance Right   Extremity/Trunk Assessment Upper Extremity Assessment Upper Extremity Assessment: LUE deficits/detail LUE Deficits / Details: Decreased shoulder AROM (~90 degrees). Pt reports she fell and broke shoulder ~1 year ago and has had limited ROM since.   Lower Extremity Assessment Lower Extremity Assessment: Defer to PT evaluation   Cervical / Trunk Assessment Cervical / Trunk Assessment: Normal   Communication Communication Communication: No difficulties   Cognition Arousal/Alertness: Awake/alert Behavior During Therapy: WFL for tasks assessed/performed Overall Cognitive Status: No family/caregiver present to determine baseline cognitive functioning Area of Impairment: Safety/judgement     Memory: Decreased short-term memory   Safety/Judgement: Decreased awareness of safety     General Comments: Mildly impulsive   General Comments       Exercises       Shoulder Instructions      Home Living Family/patient expects to be  discharged to:: Skilled nursing facility                                 Additional Comments: Lives at [facility]      Prior Functioning/Environment Level of Independence:  Independent with assistive device(s)        Comments: Cane or RW for ambulation    OT Diagnosis: Cognitive deficits   OT Problem List:     OT Treatment/Interventions:      OT Goals(Current goals can be found in the care plan section) Acute Rehab OT Goals Patient Stated Goal: return to facility OT Goal Formulation: All assessment and education complete, DC therapy  OT Frequency:     Barriers to D/C:            Co-evaluation              End of Session Equipment Utilized During Treatment: Gait belt;Rolling walker  Activity Tolerance: Patient tolerated treatment well Patient left: in chair;with call bell/phone within reach;with chair alarm set   Time: [id number] OT Time Calculation (min): 30 min Charges:  OT General Charges $OT Visit: 1 Procedure OT Evaluation $OT Eval Moderate Complexity: 1 Procedure OT Treatments $Self Care/Home Management : 8-22 mins G-Codes: OT G-codes **NOT FOR INPATIENT CLASS** Functional Assessment Tool Used: Clinical judgement Functional Limitation: Self care Self Care Current Status [id number]): At least 1 percent but less than 20 percent impaired, limited or restricted Self Care Goal Status [id number]): At least 1 percent but less than 20 percent impaired, limited or restricted Self Care Discharge Status [phone]): At least 1 percent but less than 20 percent impaired, limited or restricted   Binnie Kand M.S., OTR/L Pager: [phone]  08/09/2015, 12:26 PM

## 2015-08-09 NOTE — Progress Notes (Addendum)
Patient will DC to: [facility] Anticipated DC date: 08/09/15 Family notified: [place] DSS Guardian Tiffany Dunn [phone]) Transport by: Corey Harold   Per MD patient ready for DC to [facility]. RN, patient, patient's guardian, and facility notified of DC. RN given number for report. DC packet on chart. Ambulance transport requested for patient.   CSW signing off.  Cedric Fishman, [place] Social Worker [phone]

## 2015-08-09 NOTE — Progress Notes (Addendum)
Physical Therapy Treatment Patient Details Name: Karla Alexander MRN: [id number] DOB: [birth year] Today's Date: 08/09/2015    History of Present Illness Pt is an 82 y.o. female with medical history significant of depression, chronic kidney disease stage II, anemia, hypertension, hyperlipidemia and chronic pain was brought from the assisted living facility after patient was found to be confused and having some slurred speech.    PT Comments    Pt is more alert today. D/C recommendations updated to recommend 24-hour supervision.  Her ALF is able to provide a higher level of care short term to meet her needs.  Her primary need is supervision due to decreased safety awareness and increased risk for falls. She has a 4WW and needs encouragement/reminders to use it while ambulating.  No other DME needed.   Follow Up Recommendations  Supervision/Assistance - 24 hour;Home health PT     Equipment Recommendations  None recommended by PT    Recommendations for Other Services       Precautions / Restrictions Precautions Precautions: Fall Restrictions Weight Bearing Restrictions: No    Mobility  Bed Mobility               General bed mobility comments: Pt received in recliner.  Transfers   Equipment used: Rolling walker (2 wheeled)   Sit to Stand: Min guard Stand pivot transfers: Min guard       General transfer comment: verbal cues for sequencing and safety  Ambulation/Gait Ambulation/Gait assistance: Min guard Ambulation Distance (Feet): 120 Feet Assistive device: Rolling walker (2 wheeled) Gait Pattern/deviations: Step-through pattern;Decreased stride length Gait velocity: decreased Gait velocity interpretation: Below normal speed for age/gender General Gait Details: Attempted ambulation without  A.D. Pt with multi LOB backwards requiring min assist to maintain stance.   Stairs            Wheelchair Mobility    Modified Rankin (Stroke Patients Only)        Balance Overall balance assessment: History of Falls;Needs assistance Sitting-balance support: No upper extremity supported;Feet supported Sitting balance-Leahy Scale: Good     Standing balance support: Bilateral upper extremity supported;During functional activity Standing balance-Leahy Scale: Fair                      Cognition Arousal/Alertness: Awake/alert Behavior During Therapy: WFL for tasks assessed/performed Overall Cognitive Status: No family/caregiver present to determine baseline cognitive functioning Area of Impairment: Safety/judgement     Memory: Decreased short-term memory   Safety/Judgement: Decreased awareness of safety     General Comments: Mildly impulsive    Exercises      General Comments        Pertinent Vitals/Pain Pain Assessment: No/denies pain    Home Living                      Prior Function            PT Goals (current goals can now be found in the care plan section) Acute Rehab PT Goals Patient Stated Goal: home PT Goal Formulation: With patient Time For Goal Achievement: 08/22/15 Potential to Achieve Goals: Good Progress towards PT goals: Progressing toward goals    Frequency  Min 3X/week    PT Plan Discharge plan needs to be updated;Frequency needs to be updated    Co-evaluation             End of Session Equipment Utilized During Treatment: Gait belt Activity Tolerance: Patient tolerated treatment well Patient left: in  chair;with call bell/phone within reach;with chair alarm set     Time: [phone] PT Time Calculation (min) (ACUTE ONLY): 23 min  Charges:  $Gait Training: 23-37 mins                    G Codes:  Functional Assessment Tool Used: clinical judgement Functional Limitation: Mobility: Walking and moving around Mobility: Walking and Moving Around Current Status [phone]): At least 1 percent but less than 20 percent impaired, limited or restricted Mobility: Walking and Moving  Around Goal Status [phone]): At least 1 percent but less than 20 percent impaired, limited or restricted   Lorriane Shire 08/09/2015, 11:30 AM

## 2015-08-09 NOTE — Progress Notes (Signed)
   08/09/15 0610 08/09/15 0700  GU Assessment  Genitourinary (WDL) X --   Genitourinary Symptoms Oliguria --   Genitalia  Female Genitalia Intact --   Urine Characteristics  Urinary Incontinence No --   Urine Color Amber --   Urine Appearance Clear --   Urine Odor Malodorous --   Urinary Interventions Bladder scan --   Bladder Scan Volume (mL) 900 mL --   Intermittent/Straight Cath (mL) --  1010 mL  Intermittent Catheter Size --  16   Patient very lethargic from 1900-0700. Patient toileted x 2 with estimated <20 mL. Bladder scanned, in/out cath see above. RN called TH to notifiy, answering machine. Will notify on-call MD and continue to monitor patient.

## 2015-08-14 DIAGNOSIS — I10 Essential (primary) hypertension: Secondary | ICD-10-CM | POA: Diagnosis not present

## 2015-08-14 DIAGNOSIS — G9340 Encephalopathy, unspecified: Secondary | ICD-10-CM | POA: Diagnosis not present

## 2015-08-14 DIAGNOSIS — M25519 Pain in unspecified shoulder: Secondary | ICD-10-CM | POA: Diagnosis not present

## 2015-08-14 DIAGNOSIS — F0390 Unspecified dementia without behavioral disturbance: Secondary | ICD-10-CM | POA: Diagnosis not present

## 2015-08-23 DIAGNOSIS — J449 Chronic obstructive pulmonary disease, unspecified: Secondary | ICD-10-CM | POA: Diagnosis not present

## 2015-08-23 DIAGNOSIS — I10 Essential (primary) hypertension: Secondary | ICD-10-CM | POA: Diagnosis not present

## 2015-08-23 DIAGNOSIS — F329 Major depressive disorder, single episode, unspecified: Secondary | ICD-10-CM | POA: Diagnosis not present

## 2015-09-03 DIAGNOSIS — N390 Urinary tract infection, site not specified: Secondary | ICD-10-CM | POA: Diagnosis not present

## 2015-09-05 DIAGNOSIS — E039 Hypothyroidism, unspecified: Secondary | ICD-10-CM | POA: Diagnosis not present

## 2015-09-12 DIAGNOSIS — F39 Unspecified mood [affective] disorder: Secondary | ICD-10-CM | POA: Diagnosis not present

## 2015-09-12 DIAGNOSIS — F0390 Unspecified dementia without behavioral disturbance: Secondary | ICD-10-CM | POA: Diagnosis not present

## 2015-09-12 DIAGNOSIS — F419 Anxiety disorder, unspecified: Secondary | ICD-10-CM | POA: Diagnosis not present

## 2015-09-12 DIAGNOSIS — F339 Major depressive disorder, recurrent, unspecified: Secondary | ICD-10-CM | POA: Diagnosis not present

## 2015-09-26 DIAGNOSIS — N390 Urinary tract infection, site not specified: Secondary | ICD-10-CM | POA: Diagnosis not present

## 2015-09-30 DIAGNOSIS — N390 Urinary tract infection, site not specified: Secondary | ICD-10-CM | POA: Diagnosis not present

## 2015-10-10 ENCOUNTER — Inpatient Hospital Stay: Payer: Commercial Managed Care - HMO

## 2015-10-10 ENCOUNTER — Emergency Department: Payer: Commercial Managed Care - HMO

## 2015-10-10 ENCOUNTER — Encounter: Payer: Self-pay | Admitting: Emergency Medicine

## 2015-10-10 ENCOUNTER — Inpatient Hospital Stay
Admission: EM | Admit: 2015-10-10 | Discharge: 2015-10-12 | DRG: 189 | Disposition: A | Discharge status: Skilled Nursing Facility | Payer: Commercial Managed Care - HMO | Attending: Internal Medicine | Admitting: Internal Medicine

## 2015-10-10 DIAGNOSIS — N182 Chronic kidney disease, stage 2 (mild): Secondary | ICD-10-CM | POA: Diagnosis not present

## 2015-10-10 DIAGNOSIS — J209 Acute bronchitis, unspecified: Secondary | ICD-10-CM | POA: Diagnosis present

## 2015-10-10 DIAGNOSIS — R6889 Other general symptoms and signs: Secondary | ICD-10-CM | POA: Diagnosis not present

## 2015-10-10 DIAGNOSIS — R404 Transient alteration of awareness: Secondary | ICD-10-CM | POA: Diagnosis not present

## 2015-10-10 DIAGNOSIS — F329 Major depressive disorder, single episode, unspecified: Secondary | ICD-10-CM | POA: Diagnosis present

## 2015-10-10 DIAGNOSIS — G9340 Encephalopathy, unspecified: Secondary | ICD-10-CM | POA: Diagnosis not present

## 2015-10-10 DIAGNOSIS — K5900 Constipation, unspecified: Secondary | ICD-10-CM | POA: Diagnosis present

## 2015-10-10 DIAGNOSIS — E785 Hyperlipidemia, unspecified: Secondary | ICD-10-CM | POA: Diagnosis present

## 2015-10-10 DIAGNOSIS — A419 Sepsis, unspecified organism: Secondary | ICD-10-CM | POA: Diagnosis not present

## 2015-10-10 DIAGNOSIS — K219 Gastro-esophageal reflux disease without esophagitis: Secondary | ICD-10-CM | POA: Diagnosis present

## 2015-10-10 DIAGNOSIS — Z7983 Long term (current) use of bisphosphonates: Secondary | ICD-10-CM

## 2015-10-10 DIAGNOSIS — F0390 Unspecified dementia without behavioral disturbance: Secondary | ICD-10-CM | POA: Diagnosis not present

## 2015-10-10 DIAGNOSIS — I5032 Chronic diastolic (congestive) heart failure: Secondary | ICD-10-CM | POA: Diagnosis present

## 2015-10-10 DIAGNOSIS — J9601 Acute respiratory failure with hypoxia: Secondary | ICD-10-CM | POA: Diagnosis not present

## 2015-10-10 DIAGNOSIS — Z8249 Family history of ischemic heart disease and other diseases of the circulatory system: Secondary | ICD-10-CM

## 2015-10-10 DIAGNOSIS — R509 Fever, unspecified: Secondary | ICD-10-CM | POA: Diagnosis present

## 2015-10-10 DIAGNOSIS — R410 Disorientation, unspecified: Secondary | ICD-10-CM | POA: Diagnosis not present

## 2015-10-10 DIAGNOSIS — Z7982 Long term (current) use of aspirin: Secondary | ICD-10-CM

## 2015-10-10 DIAGNOSIS — E039 Hypothyroidism, unspecified: Secondary | ICD-10-CM | POA: Diagnosis not present

## 2015-10-10 DIAGNOSIS — Z7401 Bed confinement status: Secondary | ICD-10-CM | POA: Diagnosis not present

## 2015-10-10 DIAGNOSIS — N179 Acute kidney failure, unspecified: Secondary | ICD-10-CM | POA: Diagnosis present

## 2015-10-10 DIAGNOSIS — D649 Anemia, unspecified: Secondary | ICD-10-CM | POA: Diagnosis not present

## 2015-10-10 DIAGNOSIS — I110 Hypertensive heart disease with heart failure: Secondary | ICD-10-CM | POA: Diagnosis present

## 2015-10-10 DIAGNOSIS — I2510 Atherosclerotic heart disease of native coronary artery without angina pectoris: Secondary | ICD-10-CM | POA: Diagnosis not present

## 2015-10-10 DIAGNOSIS — Z79899 Other long term (current) drug therapy: Secondary | ICD-10-CM

## 2015-10-10 DIAGNOSIS — E860 Dehydration: Secondary | ICD-10-CM | POA: Diagnosis not present

## 2015-10-10 DIAGNOSIS — G894 Chronic pain syndrome: Secondary | ICD-10-CM | POA: Diagnosis not present

## 2015-10-10 DIAGNOSIS — I129 Hypertensive chronic kidney disease with stage 1 through stage 4 chronic kidney disease, or unspecified chronic kidney disease: Secondary | ICD-10-CM | POA: Diagnosis not present

## 2015-10-10 LAB — URINALYSIS COMPLETE WITH MICROSCOPIC (ARMC ONLY)
Bacteria, UA: NONE SEEN
Bilirubin Urine: NEGATIVE
Glucose, UA: NEGATIVE mg/dL
HGB URINE DIPSTICK: NEGATIVE
KETONES UR: NEGATIVE mg/dL
Nitrite: NEGATIVE
PH: 6 (ref 5.0–8.0)
PROTEIN: NEGATIVE mg/dL
SPECIFIC GRAVITY, URINE: 1.012 (ref 1.005–1.030)

## 2015-10-10 LAB — COMPREHENSIVE METABOLIC PANEL
ALT: 12 U/L — ABNORMAL LOW (ref 14–54)
AST: 22 U/L (ref 15–41)
Albumin: 4.3 g/dL (ref 3.5–5.0)
Alkaline Phosphatase: 67 U/L (ref 38–126)
Anion gap: 11 (ref 5–15)
BILIRUBIN TOTAL: 0.8 mg/dL (ref 0.3–1.2)
BUN: 23 mg/dL — AB (ref 6–20)
CHLORIDE: 99 mmol/L — AB (ref 101–111)
CO2: 27 mmol/L (ref 22–32)
CREATININE: 1.74 mg/dL — AB (ref 0.44–1.00)
Calcium: 9.8 mg/dL (ref 8.9–10.3)
GFR, EST AFRICAN AMERICAN: 30 mL/min — AB (ref >60)
GFR, EST NON AFRICAN AMERICAN: 26 mL/min — AB (ref >60)
Glucose, Bld: 133 mg/dL — ABNORMAL HIGH (ref 65–99)
POTASSIUM: 3.5 mmol/L (ref 3.5–5.1)
Sodium: 137 mmol/L (ref 135–145)
TOTAL PROTEIN: 7.4 g/dL (ref 6.5–8.1)

## 2015-10-10 LAB — CBC WITH DIFFERENTIAL/PLATELET
BASOS ABS: 0 10*3/uL (ref 0–0.1)
Basophils Relative: 0 %
EOS ABS: 0.2 10*3/uL (ref 0–0.7)
EOS PCT: 2 %
HCT: 40.9 % (ref 35.0–47.0)
HEMOGLOBIN: 14.2 g/dL (ref 12.0–16.0)
LYMPHS ABS: 0.5 10*3/uL — AB (ref 1.0–3.6)
LYMPHS PCT: 6 %
MCH: 32 pg (ref 26.0–34.0)
MCHC: 34.6 g/dL (ref 32.0–36.0)
MCV: 92.5 fL (ref 80.0–100.0)
Monocytes Absolute: 0.6 10*3/uL (ref 0.2–0.9)
Monocytes Relative: 6 %
NEUTROS PCT: 86 %
Neutro Abs: 8.3 10*3/uL — ABNORMAL HIGH (ref 1.4–6.5)
PLATELETS: 179 10*3/uL (ref 150–440)
RBC: 4.43 MIL/uL (ref 3.80–5.20)
RDW: 13.1 % (ref 11.5–14.5)
WBC: 9.6 10*3/uL (ref 3.6–11.0)

## 2015-10-10 LAB — LACTIC ACID, PLASMA
LACTIC ACID, VENOUS: 1.1 mmol/L (ref 0.5–1.9)
Lactic Acid, Venous: 1.3 mmol/L (ref 0.5–1.9)

## 2015-10-10 MED ORDER — SODIUM CHLORIDE 0.9 % IV SOLN
1000.0000 mL | INTRAVENOUS | Status: DC
  Administered 2015-10-10: 1000 mL via INTRAVENOUS

## 2015-10-10 MED ORDER — HALOPERIDOL LACTATE 5 MG/ML IJ SOLN
2.0000 mg | Freq: Four times a day (QID) | INTRAMUSCULAR | Status: DC | PRN
  Administered 2015-10-10 – 2015-10-11 (×2): 2 mg via INTRAVENOUS
  Filled 2015-10-10 (×2): qty 1

## 2015-10-10 MED ORDER — NITROFURANTOIN MACROCRYSTAL 100 MG PO CAPS: 100.0000 mg | ORAL_CAPSULE | Freq: Three times a day (TID) | ORAL | Status: DC

## 2015-10-10 MED ORDER — ACETAMINOPHEN 650 MG RE SUPP
650.0000 mg | Freq: Four times a day (QID) | RECTAL | Status: DC | PRN
  Administered 2015-10-11: 650 mg via RECTAL
  Filled 2015-10-10: qty 1

## 2015-10-10 MED ORDER — ACETAMINOPHEN 325 MG RE SUPP
325.0000 mg | Freq: Once | RECTAL | Status: AC
  Administered 2015-10-10: 325 mg via RECTAL

## 2015-10-10 MED ORDER — PRAVASTATIN SODIUM 10 MG PO TABS
10.0000 mg | ORAL_TABLET | Freq: Every day | ORAL | Status: DC
  Administered 2015-10-10 – 2015-10-11 (×2): 10 mg via ORAL
  Filled 2015-10-10 (×2): qty 1

## 2015-10-10 MED ORDER — CALCIUM CITRATE-VITAMIN D 500-400 MG-UNIT PO CHEW
1.0000 | CHEWABLE_TABLET | Freq: Every day | ORAL | Status: DC
  Administered 2015-10-11 – 2015-10-12 (×2): 1 via ORAL
  Filled 2015-10-10 (×2): qty 1

## 2015-10-10 MED ORDER — POLYETHYLENE GLYCOL 3350 17 G PO PACK
17.0000 g | PACK | Freq: Every day | ORAL | Status: DC
  Administered 2015-10-11 – 2015-10-12 (×2): 17 g via ORAL
  Filled 2015-10-10 (×2): qty 1

## 2015-10-10 MED ORDER — METOPROLOL TARTRATE 25 MG PO TABS
25.0000 mg | ORAL_TABLET | Freq: Two times a day (BID) | ORAL | Status: DC
  Administered 2015-10-10 – 2015-10-12 (×4): 25 mg via ORAL
  Filled 2015-10-10 (×5): qty 1

## 2015-10-10 MED ORDER — ASPIRIN 325 MG PO TABS
325.0000 mg | ORAL_TABLET | Freq: Every day | ORAL | Status: DC
  Administered 2015-10-11 – 2015-10-12 (×2): 325 mg via ORAL
  Filled 2015-10-10 (×2): qty 1

## 2015-10-10 MED ORDER — ACETAMINOPHEN 325 MG PO TABS
650.0000 mg | ORAL_TABLET | Freq: Four times a day (QID) | ORAL | Status: DC | PRN
  Administered 2015-10-11: 650 mg via ORAL
  Filled 2015-10-10: qty 2

## 2015-10-10 MED ORDER — SODIUM CHLORIDE 0.9 % IV SOLN
INTRAVENOUS | Status: DC
  Administered 2015-10-10: 13:00:00 via INTRAVENOUS

## 2015-10-10 MED ORDER — SODIUM CHLORIDE 0.9% FLUSH
3.0000 mL | Freq: Two times a day (BID) | INTRAVENOUS | Status: DC
  Administered 2015-10-10 – 2015-10-12 (×2): 3 mL via INTRAVENOUS

## 2015-10-10 MED ORDER — VITAMIN D 1000 UNITS PO TABS
1000.0000 [IU] | ORAL_TABLET | Freq: Every day | ORAL | Status: DC
  Administered 2015-10-11 – 2015-10-12 (×2): 1000 [IU] via ORAL
  Filled 2015-10-10 (×3): qty 1

## 2015-10-10 MED ORDER — SODIUM CHLORIDE 0.9 % IV BOLUS (SEPSIS)
1000.0000 mL | Freq: Once | INTRAVENOUS | Status: AC
  Administered 2015-10-10: 1000 mL via INTRAVENOUS

## 2015-10-10 MED ORDER — ONDANSETRON HCL 4 MG PO TABS: 4.0000 mg | ORAL_TABLET | Freq: Four times a day (QID) | ORAL | Status: DC | PRN

## 2015-10-10 MED ORDER — FAMOTIDINE 20 MG PO TABS
20.0000 mg | ORAL_TABLET | Freq: Two times a day (BID) | ORAL | Status: DC
  Administered 2015-10-10: 20 mg via ORAL
  Filled 2015-10-10: qty 1

## 2015-10-10 MED ORDER — ONDANSETRON HCL 4 MG/2ML IJ SOLN: 4.0000 mg | Freq: Four times a day (QID) | INTRAMUSCULAR | Status: DC | PRN

## 2015-10-10 MED ORDER — ENOXAPARIN SODIUM 40 MG/0.4ML ~~LOC~~ SOLN
30.0000 mg | SUBCUTANEOUS | Status: DC
  Administered 2015-10-10 – 2015-10-11 (×2): 30 mg via SUBCUTANEOUS
  Filled 2015-10-10 (×2): qty 0.4

## 2015-10-10 MED ORDER — CITALOPRAM HYDROBROMIDE 20 MG PO TABS
20.0000 mg | ORAL_TABLET | Freq: Every day | ORAL | Status: DC
  Administered 2015-10-11 – 2015-10-12 (×2): 20 mg via ORAL
  Filled 2015-10-10 (×2): qty 1

## 2015-10-10 MED ORDER — ALENDRONATE SODIUM 70 MG PO TABS: 70.0000 mg | ORAL_TABLET | ORAL | Status: DC

## 2015-10-10 MED ORDER — LEVOTHYROXINE SODIUM 137 MCG PO TABS
68.5000 ug | ORAL_TABLET | Freq: Every day | ORAL | Status: DC
  Administered 2015-10-11 – 2015-10-12 (×2): 68.5 ug via ORAL
  Filled 2015-10-10: qty 1
  Filled 2015-10-10: qty 0.5
  Filled 2015-10-10: qty 1

## 2015-10-10 MED ORDER — DIVALPROEX SODIUM 250 MG PO DR TAB
250.0000 mg | DELAYED_RELEASE_TABLET | Freq: Two times a day (BID) | ORAL | Status: DC
  Administered 2015-10-10 – 2015-10-12 (×4): 250 mg via ORAL
  Filled 2015-10-10 (×5): qty 1

## 2015-10-10 MED ORDER — ALUM & MAG HYDROXIDE-SIMETH 200-200-20 MG/5ML PO SUSP: 30.0000 mL | ORAL | Status: DC | PRN

## 2015-10-10 MED ORDER — PIPERACILLIN-TAZOBACTAM 3.375 G IVPB 30 MIN
3.3750 g | Freq: Once | INTRAVENOUS | Status: AC
  Administered 2015-10-10: 3.375 g via INTRAVENOUS
  Filled 2015-10-10: qty 50

## 2015-10-10 MED ORDER — ACETAMINOPHEN 325 MG RE SUPP
RECTAL | Status: AC
  Administered 2015-10-10: 325 mg via RECTAL
  Filled 2015-10-10: qty 1

## 2015-10-10 MED ORDER — ACETAMINOPHEN ER 650 MG PO TBCR: 650.0000 mg | EXTENDED_RELEASE_TABLET | ORAL | Status: DC | PRN

## 2015-10-10 NOTE — ED Provider Notes (Signed)
Time Seen: Approximately 1125  I have reviewed the triage notes  Chief Complaint: Altered Mental Status   History of Present Illness: Karla Alexander is a 82 y.o. female who was transported here by EMS from [facility]. Patient was walking last night lost her balance and seemed to have a decreased mental status. Patient was transported here by EMS where she was noted to have a fever. Code sepsis was called shortly after arrival. Patient has a history of dementia and is currently not a reliable historian. She does not appear to have any focal weakness. Past Medical History  Diagnosis Date  . Depression   . GERD (gastroesophageal reflux disease)   . Hypothyroidism   . Shortness of breath   . Arthritis   . Hypertension     dr Delsa Sale   [place]  . Coronary artery disease     non-obstructive CAD 04/2013  . Constipation   . Seasonal allergies   . Hyperlipemia   . Dementia     Patient Active Problem List   Diagnosis Date Noted  . Fever 10/10/2015  . Chronic diastolic congestive heart failure ([place]) 08/09/2015  . Dementia 08/07/2015  . Altered mental status 08/06/2015  . Acute encephalopathy 08/06/2015  . Hypothyroidism 08/06/2015  . HLD (hyperlipidemia) 08/06/2015  . Depression 08/06/2015  . Hypertension 08/06/2015  . Proximal humerus fracture 08/23/2014  . S/P lumbar spinal fusion 05/31/2013    Past Surgical History  Procedure Laterality Date  . Cardiac catheterization  04/24/2013    20% mLAD, 20% OM1, 30% #1/60% #2 pRCA [facility]).  EF 68% by stress test 04/12/13.   . Maximum access (mas)posterior lumbar interbody fusion (plif) 2 level N/A 05/31/2013    Procedure: FOR MAXIMUM ACCESS (MAS) POSTERIOR LUMBAR INTERBODY FUSION (PLIF) Lumbar Four-Five, Lumbar Five-Sacral One;  Surgeon: Eustace Moore, MD;  Location: [facility];  Service: Neurosurgery;  Laterality: N/A;  FOR MAXIMUM ACCESS (MAS) POSTERIOR LUMBAR INTERBODY FUSION (PLIF) Lumbar Four-Five, Lumbar Five-Sacral One   . Back surgery    . Reverse shoulder arthroplasty Left 08/23/2014    Procedure: REVERSE SHOULDER ARTHROPLASTY WITH APPLICATION OF CABLES ;  Surgeon: Tania Ade, MD;  Location: [place];  Service: Orthopedics;  Laterality: Left;  Left reverse total shoulder arthroplasty    Past Surgical History  Procedure Laterality Date  . Cardiac catheterization  04/24/2013    20% mLAD, 20% OM1, 30% #1/60% #2 pRCA [facility]).  EF 68% by stress test 04/12/13.   . Maximum access (mas)posterior lumbar interbody fusion (plif) 2 level N/A 05/31/2013    Procedure: FOR MAXIMUM ACCESS (MAS) POSTERIOR LUMBAR INTERBODY FUSION (PLIF) Lumbar Four-Five, Lumbar Five-Sacral One;  Surgeon: Eustace Moore, MD;  Location: [facility];  Service: Neurosurgery;  Laterality: N/A;  FOR MAXIMUM ACCESS (MAS) POSTERIOR LUMBAR INTERBODY FUSION (PLIF) Lumbar Four-Five, Lumbar Five-Sacral One  . Back surgery    . Reverse shoulder arthroplasty Left 08/23/2014    Procedure: REVERSE SHOULDER ARTHROPLASTY WITH APPLICATION OF CABLES ;  Surgeon: Tania Ade, MD;  Location: [place];  Service: Orthopedics;  Laterality: Left;  Left reverse total shoulder arthroplasty    Current Outpatient Rx  Name  Route  Sig  Dispense  Refill  . acetaminophen (TYLENOL) 650 MG CR tablet   Oral   Take 650 mg by mouth every 4 (four) hours as needed for pain.         Marland Kitchen alendronate (FOSAMAX) 70 MG tablet   Oral   Take 70 mg by mouth every  Monday.          Marland Kitchen alum & mag hydroxide-simeth (MAALOX/MYLANTA) 200-200-20 MG/5ML suspension   Oral   Take 30 mLs by mouth every 4 (four) hours as needed for indigestion or heartburn.         Marland Kitchen aspirin 325 MG tablet   Oral   Take 325 mg by mouth daily.         . Calcium Citrate-Vitamin D 250-200 MG-UNIT TABS   Oral   Take 1 tablet by mouth 2 (two) times daily.         . cholecalciferol (VITAMIN D) 1000 units tablet   Oral   Take 1,000 Units by mouth daily.         . citalopram (CELEXA) 20 MG tablet    Oral   Take 20 mg by mouth daily.         . clonazePAM (KLONOPIN) 0.5 MG tablet   Oral   Take 0.5 mg by mouth 2 (two) times daily as needed for anxiety.          . divalproex (DEPAKOTE) 250 MG DR tablet   Oral   Take 250 mg by mouth 2 (two) times daily.         Marland Kitchen levothyroxine (SYNTHROID, LEVOTHROID) 137 MCG tablet   Oral   Take 0.5 tablets (68.5 mcg total) by mouth daily before breakfast. Patient taking differently: Take 68.5 mcg by mouth daily.    30 tablet   0   . lovastatin (MEVACOR) 20 MG tablet   Oral   Take 20 mg by mouth every evening.          . metoprolol (LOPRESSOR) 50 MG tablet   Oral   Take 25 mg by mouth 2 (two) times daily.         . nitrofurantoin (MACRODANTIN) 100 MG capsule   Oral   Take 100 mg by mouth 3 (three) times daily. For 7 days         . oxyCODONE-acetaminophen (ROXICET) 5-325 MG per tablet   Oral   Take 1-2 tablets by mouth every 4 (four) hours as needed for severe pain. Patient taking differently: Take 1 tablet by mouth every 4 (four) hours as needed for severe pain.    60 tablet   0   . polyethylene glycol (MIRALAX / GLYCOLAX) packet   Oral   Take 17 g by mouth daily.         . ranitidine (ZANTAC) 150 MG tablet   Oral   Take 150 mg by mouth daily.         Marland Kitchen triamterene-hydrochlorothiazide (DYAZIDE) 37.5-25 MG per capsule   Oral   Take 1 capsule by mouth daily.         Marland Kitchen amoxicillin-clavulanate (AUGMENTIN) 875-125 MG tablet   Oral   Take 1 tablet by mouth every 12 (twelve) hours. Patient not taking: Reported on 10/10/2015   8 tablet   0   . QUEtiapine (SEROQUEL) 25 MG tablet   Oral   Take 0.5 tablets (12.5 mg total) by mouth at bedtime. Patient not taking: Reported on 10/10/2015   30 tablet   0     Allergies:  Review of patient's allergies indicates no known allergies.  Family History: Family History  Problem Relation Age of Onset  . Hypertension      Social History: Social History  Substance Use  Topics  . Smoking status: Never Smoker   . Smokeless tobacco: None  . Alcohol Use: No  Review of Systems:   10 point review of systems was performed and was otherwise negative: Review of systems acquired from the medical record due to the patient's history of dementia and essentially decreased responsiveness Constitutional: No fever Eyes: No visual disturbances ENT: No sore throat, ear pain Cardiac: No chest pain Respiratory: No shortness of breath, wheezing, or stridor Abdomen: No abdominal pain, no vomiting, No diarrhea Endocrine: No weight loss, No night sweats Extremities: No peripheral edema, cyanosis Skin: No rashes, easy bruising Neurologic: No focal weakness, trouble with speech or swollowing Urologic: No dysuria, Hematuria, or urinary frequency   Physical Exam:  ED Triage Vitals  Enc Vitals Group     BP 10/10/15 1121 126/91 mmHg     Pulse Rate 10/10/15 1121 72     Resp 10/10/15 1121 18     Temp 10/10/15 1121 101.6 F (38.7 C)     Temp Source 10/10/15 1121 Rectal     SpO2 10/10/15 1121 93 %     Weight 10/10/15 1121 146 lb 11.2 oz (66.543 kg)     Height 10/10/15 1121 5\' 2"  (1.575 m)     Head Cir --      Peak Flow --      Pain Score --      Pain Loc --      Pain Edu? --      Excl. in [place]? --     General: Awake , Alert , and Oriented times 1. She does follow some commands and is arousable Head: Normal cephalic , atraumatic Eyes: Pupils equal , round, reactive to light Nose/Throat: No nasal drainage, patent upper airway without erythema or exudate.  Neck: Supple, Full range of motion, No anterior adenopathy or palpable thyroid masses Lungs: Clear to ascultation without wheezes , rhonchi, or rales Heart: Regular rate, regular rhythm without murmurs , gallops , or rubs Abdomen: Soft, non tender without rebound, guarding , or rigidity; bowel sounds positive and symmetric in all 4 quadrants. No organomegaly .        Extremities: 2 plus symmetric pulses. No  edema, clubbing or cyanosis Neurologic: , Motor symmetric without deficits, sensory intact Skin: warm, dry, no rashes   Labs:   All laboratory work was reviewed including any pertinent negatives or positives listed below:  Labs Reviewed  COMPREHENSIVE METABOLIC PANEL - Abnormal; Notable for the following:    Chloride 99 (*)    Glucose, Bld 133 (*)    BUN 23 (*)    Creatinine, Ser 1.74 (*)    ALT 12 (*)    GFR calc non Af Amer 26 (*)    GFR calc Af Amer 30 (*)    All other components within normal limits  CBC WITH DIFFERENTIAL/PLATELET - Abnormal; Notable for the following:    Neutro Abs 8.3 (*)    Lymphs Abs 0.5 (*)    All other components within normal limits  URINALYSIS COMPLETEWITH MICROSCOPIC ([facility] ONLY) - Abnormal; Notable for the following:    Color, Urine YELLOW (*)    APPearance CLEAR (*)    Leukocytes, UA 2+ (*)    Squamous Epithelial / LPF 0-5 (*)    All other components within normal limits  CULTURE, BLOOD (ROUTINE X 2)  CULTURE, BLOOD (ROUTINE X 2)  URINE CULTURE  URINE CULTURE  LACTIC ACID, PLASMA  LACTIC ACID, PLASMA  Review laboratory work shows some mild renal insufficiency otherwise no acute significant abnormalities  EKG:  ED ECG REPORT I, Daymon Larsen, the attending  physician, personally viewed and interpreted this ECG.  Date: 10/10/2015 EKG Time: 1137 Rate: 74 Rhythm: normal sinus rhythm QRS Axis: normal Intervals: normal ST/T Wave abnormalities: Nonspecific T wave abnormality Conduction Disturbances: none Narrative Interpretation: unremarkable No acute ischemic changes   Radiology:      DG Chest 1 View (Final result) Result time: 10/10/15 12:29:26   Final result by Rad Results In Interface (10/10/15 12:29:26)   Narrative:   CLINICAL DATA: Sepsis.  EXAM: CHEST 1 VIEW  COMPARISON: 08/06/2015  FINDINGS: Low lung volumes are noted. Chronic central peribronchial thickening noted. No evidence of pulmonary consolidation or  edema. No evidence of pleural effusion. Heart size is stable. Left shoulder prosthesis again noted.  IMPRESSION: Low lung volumes. No acute findings.   Electronically Signed By: Earle Gell M.D. On: 10/10/2015 12:29           I personally reviewed the radiologic studies   Critical Care:  CRITICAL CARE Performed by: Daymon Larsen   Total critical care time: 33 minutes  Critical care time was exclusive of separately billable procedures and treating other patients.  Critical care was necessary to treat or prevent imminent or life-threatening deterioration.  Critical care was time spent personally by me on the following activities: development of treatment plan with patient and/or surrogate as well as nursing, discussions with consultants, evaluation of patient's response to treatment, examination of patient, obtaining history from patient or surrogate, ordering and performing treatments and interventions, ordering and review of laboratory studies, ordering and review of radiographic studies, pulse oximetry and re-evaluation of patient's condition.  Initial evaluation and workup for a code sepsis.   ED Course:  The patient does not appear to be in septic shock though on return of her laboratory work does have some renal insufficiency and was given a fluid bolus at that time. Her blood pressures have remained stable and she was initiated on broad-spectrum antibiotic therapy after blood and urine culture have been ordered and obtained samples. Source of the patient's fever is unknown at this point. The patient had some mild hypoxia and may be due to her dehydration may develop some pneumonia once she is fully resuscitated.  Assessment:  Acute febrile illness Code sepsis      Plan: Inpatient management            Daymon Larsen, MD 10/10/15 1329

## 2015-10-10 NOTE — ED Notes (Signed)
Patient able to follow simple commands now. Requires repetitive cues.

## 2015-10-10 NOTE — ED Notes (Signed)
Informed RN bed ready [phone]

## 2015-10-10 NOTE — ED Notes (Signed)
Patient is a resident of [facility] and staff report that while walking patient last night, patient lost balance and required to be brought back to her room with a wheelchair.  Staff report that patient is typically independently mobile.  Patient has been in bed all day and sent to ED for evaluation.

## 2015-10-10 NOTE — ED Notes (Signed)
Patient placed on 2L [place] due to SpO2 90% on RA. Patient tolerating well. Up to 97%.

## 2015-10-10 NOTE — NC FL2 (Signed)
[place] LEVEL OF CARE SCREENING TOOL     IDENTIFICATION  Patient Name: Karla Alexander Birthdate: 02-08-1933 Sex: female Admission Date (Current Location): 10/10/2015  [facility] and [place] Number:  Karla Alexander  ([id number]) Facility and Address:  [facility], [address] [place], [place] [address]      Provider Number: [id number]  Attending Physician Name and Address:  Dustin Flock, MD  Relative Name and Phone Number:       Current Level of Care: Hospital Recommended Level of Care: [place] Prior Approval Number:    Date Approved/Denied:   PASRR Number:  ([id number])  Discharge Plan: SNF    Current Diagnoses: Patient Active Problem List   Diagnosis Date Noted  . Fever 10/10/2015  . Chronic diastolic congestive heart failure ([place]) 08/09/2015  . Dementia 08/07/2015  . Altered mental status 08/06/2015  . Acute encephalopathy 08/06/2015  . Hypothyroidism 08/06/2015  . HLD (hyperlipidemia) 08/06/2015  . Depression 08/06/2015  . Hypertension 08/06/2015  . Proximal humerus fracture 08/23/2014  . S/P lumbar spinal fusion 05/31/2013    Orientation RESPIRATION BLADDER Height & Weight     Self  Normal Continent Weight: 151 lb (68.493 kg) Height:  5\' 2"  (157.5 cm)  BEHAVIORAL SYMPTOMS/MOOD NEUROLOGICAL BOWEL NUTRITION STATUS   (none )  (none ) Continent Diet (Diet: Soft )  AMBULATORY STATUS COMMUNICATION OF NEEDS Skin   Extensive Assist Verbally Normal                       Personal Care Assistance Level of Assistance  Bathing, Feeding, Dressing Bathing Assistance: Limited assistance Feeding assistance: Independent Dressing Assistance: Limited assistance     Functional Limitations Info  Sight, Hearing, Speech Sight Info: Adequate Hearing Info: Adequate Speech Info: Adequate    SPECIAL CARE FACTORS FREQUENCY                       Contractures      Additional Factors Info  Code  Status, Allergies Code Status Info:  (Full Code. ) Allergies Info:  (No Known Allergies. )           Current Medications (10/10/2015):  This is the current hospital active medication list Current Facility-Administered Medications  Medication Dose Route Frequency Provider Last Rate Last Dose  . 0.9 %  sodium chloride infusion   Intravenous Continuous Dustin Flock, MD      . acetaminophen (TYLENOL) tablet 650 mg  650 mg Oral Q6H PRN Dustin Flock, MD       Or  . acetaminophen (TYLENOL) suppository 650 mg  650 mg Rectal Q6H PRN Dustin Flock, MD      . alum & mag hydroxide-simeth (MAALOX/MYLANTA) 200-200-20 MG/5ML suspension 30 mL  30 mL Oral Q4H PRN Dustin Flock, MD      . Derrill Memo ON 10/11/2015] aspirin tablet 325 mg  325 mg Oral Daily Dustin Flock, MD      . Derrill Memo ON 10/11/2015] calcium citrate-vitamin D 500-400 MG-UNIT per chewable tablet 1 tablet  1 tablet Oral Daily Dustin Flock, MD      . Derrill Memo ON 10/11/2015] cholecalciferol (VITAMIN D) tablet 1,000 Units  1,000 Units Oral Daily Dustin Flock, MD      . Derrill Memo ON 10/11/2015] citalopram (CELEXA) tablet 20 mg  20 mg Oral Daily Dustin Flock, MD      . divalproex (DEPAKOTE) DR tablet 250 mg  250 mg Oral BID Dustin Flock, MD      .  enoxaparin (LOVENOX) injection 30 mg  30 mg Subcutaneous Q24H Dustin Flock, MD      . famotidine (PEPCID) tablet 20 mg  20 mg Oral BID Dustin Flock, MD      . Derrill Memo ON 10/11/2015] levothyroxine (SYNTHROID, LEVOTHROID) tablet 68.5 mcg  68.5 mcg Oral QAC breakfast Dustin Flock, MD      . metoprolol tartrate (LOPRESSOR) tablet 25 mg  25 mg Oral BID Dustin Flock, MD      . ondansetron (ZOFRAN) tablet 4 mg  4 mg Oral Q6H PRN Dustin Flock, MD       Or  . ondansetron (ZOFRAN) injection 4 mg  4 mg Intravenous Q6H PRN Dustin Flock, MD      . Derrill Memo ON 10/11/2015] polyethylene glycol (MIRALAX / GLYCOLAX) packet 17 g  17 g Oral Daily Dustin Flock, MD      . pravastatin (PRAVACHOL) tablet 10 mg  10 mg  Oral q1800 Dustin Flock, MD      . sodium chloride flush (NS) 0.9 % injection 3 mL  3 mL Intravenous Q12H Dustin Flock, MD         Discharge Medications: Please see discharge summary for a list of discharge medications.  Relevant Imaging Results:  Relevant Lab Results:   Additional Information  (SSN: [id number])  Karla Alexander, Karla Betters, LCSW

## 2015-10-10 NOTE — H&P (Signed)
[facility] NAME: Karla Alexander    MR#:  [id number]  DATE OF BIRTH:  May 26, 1932  DATE OF ADMISSION:  10/10/2015  PRIMARY CARE PHYSICIAN: Radene Gunning, MD   REQUESTING/REFERRING PHYSICIAN:   Corinna Capra  CHIEF COMPLAINT:   Chief Complaint  Patient presents with  . Altered Mental Status    HISTORY OF PRESENT ILLNESS: Karla Alexander  is a 82 y.o. female with a known history of demenia, depression, GERD, hypothyroidism, hyertension , nonobstructive coronary artery disease and constipation was sent to the ED with altered metal status and low grade fever.  Patient currently  Awake, but  Confused due to her dementia.  She appears  To be very dry.  Creatinine is elevated.  Evaluation in the ER incled a chest x-ray which is negative. Her urine analysis is also negative.  patient does have history of chronic constipaion.    PAST MEDICAL HISTORY:   Past Medical History  Diagnosis Date  . Depression   . GERD (gastroesophageal reflux disease)   . Hypothyroidism   . Shortness of breath   . Arthritis   . Hypertension     dr Delsa Sale   [place]  . Coronary artery disease     non-obstructive CAD 04/2013  . Constipation   . Seasonal allergies   . Hyperlipemia   . Dementia     PAST SURGICAL HISTORY: Past Surgical History  Procedure Laterality Date  . Cardiac catheterization  04/24/2013    20% mLAD, 20% OM1, 30% #1/60% #2 pRCA [facility]).  EF 68% by stress test 04/12/13.   . Maximum access (mas)posterior lumbar interbody fusion (plif) 2 level N/A 05/31/2013    Procedure: FOR MAXIMUM ACCESS (MAS) POSTERIOR LUMBAR INTERBODY FUSION (PLIF) Lumbar Four-Five, Lumbar Five-Sacral One;  Surgeon: Eustace Moore, MD;  Location: [facility];  Service: Neurosurgery;  Laterality: N/A;  FOR MAXIMUM ACCESS (MAS) POSTERIOR LUMBAR INTERBODY FUSION (PLIF) Lumbar Four-Five, Lumbar Five-Sacral One  . Back surgery    . Reverse shoulder arthroplasty Left  08/23/2014    Procedure: REVERSE SHOULDER ARTHROPLASTY WITH APPLICATION OF CABLES ;  Surgeon: Tania Ade, MD;  Location: [facility];  Service: Orthopedics;  Laterality: Left;  Left reverse total shoulder arthroplasty    SOCIAL HISTORY:  Social History  Substance Use Topics  . Smoking status: Never Smoker   . Smokeless tobacco: Not on file  . Alcohol Use: No    FAMILY HISTORY:  Family History  Problem Relation Age of Onset  . Hypertension      DRUG ALLERGIES: No Known Allergies  REVIEW OF SYSTEMS:   CONSTITUTIONAL:   [place] with dementia unable to provide any review of systems   MEDICATIONS AT HOME:  Prior to Admission medications   Medication Sig Start Date End Date Taking? Authorizing Provider  acetaminophen (TYLENOL) 650 MG CR tablet Take 650 mg by mouth every 4 (four) hours as needed for pain.   Yes Historical Provider, MD  alendronate (FOSAMAX) 70 MG tablet Take 70 mg by mouth every Monday.    Yes Historical Provider, MD  alum & mag hydroxide-simeth (MAALOX/MYLANTA) 200-200-20 MG/5ML suspension Take 30 mLs by mouth every 4 (four) hours as needed for indigestion or heartburn.   Yes Historical Provider, MD  aspirin 325 MG tablet Take 325 mg by mouth daily.   Yes Historical Provider, MD  Calcium Citrate-Vitamin D 250-200 MG-UNIT TABS Take 1 tablet by mouth 2 (two) times daily.   Yes Historical Provider, MD  cholecalciferol (VITAMIN D) 1000 units tablet Take 1,000 Units by mouth daily.   Yes Historical Provider, MD  citalopram (CELEXA) 20 MG tablet Take 20 mg by mouth daily.   Yes Historical Provider, MD  clonazePAM (KLONOPIN) 0.5 MG tablet Take 0.5 mg by mouth 2 (two) times daily as needed for anxiety.    Yes Historical Provider, MD  divalproex (DEPAKOTE) 250 MG DR tablet Take 250 mg by mouth 2 (two) times daily.   Yes Historical Provider, MD  levothyroxine (SYNTHROID, LEVOTHROID) 137 MCG tablet Take 0.5 tablets (68.5 mcg total) by mouth daily before breakfast. Patient taking  differently: Take 68.5 mcg by mouth daily.  08/09/15  Yes Geradine Girt, DO  lovastatin (MEVACOR) 20 MG tablet Take 20 mg by mouth every evening.    Yes Historical Provider, MD  metoprolol (LOPRESSOR) 50 MG tablet Take 25 mg by mouth 2 (two) times daily.   Yes Historical Provider, MD  nitrofurantoin (MACRODANTIN) 100 MG capsule Take 100 mg by mouth 3 (three) times daily. For 7 days 10/03/15 10/10/15 Yes Historical Provider, MD  oxyCODONE-acetaminophen (ROXICET) 5-325 MG per tablet Take 1-2 tablets by mouth every 4 (four) hours as needed for severe pain. Patient taking differently: Take 1 tablet by mouth every 4 (four) hours as needed for severe pain.  08/24/14  Yes Grier Mitts, PA-C  polyethylene glycol (MIRALAX / GLYCOLAX) packet Take 17 g by mouth daily.   Yes Historical Provider, MD  ranitidine (ZANTAC) 150 MG tablet Take 150 mg by mouth daily.   Yes Historical Provider, MD  triamterene-hydrochlorothiazide (DYAZIDE) 37.5-25 MG per capsule Take 1 capsule by mouth daily.   Yes Historical Provider, MD  amoxicillin-clavulanate (AUGMENTIN) 875-125 MG tablet Take 1 tablet by mouth every 12 (twelve) hours. Patient not taking: Reported on 10/10/2015 08/09/15   Geradine Girt, DO  QUEtiapine (SEROQUEL) 25 MG tablet Take 0.5 tablets (12.5 mg total) by mouth at bedtime. Patient not taking: Reported on 10/10/2015 08/09/15   Geradine Girt, DO      PHYSICAL EXAMINATION:   VITAL SIGNS: Blood pressure 113/74, pulse 71, temperature 100.4 F (38 C), temperature source Rectal, resp. rate 16, height 5\' 2"  (1.575 m), weight 68.493 kg (151 lb), SpO2 96 %.  GENERAL:  82 y.o.-year-old patient lying in the bed with no acute distress.appears very dehydrated  EYES: Pupils equal, round, reactive to light and accommodation. No scleral icterus. Mouth is very dry HEENT: Head atraumatic, normocephalic. Oropharynx and nasopharynx clear.  NECK:  Supple, no jugular venous distention. No thyroid enlargement, no tenderness.   LUNGS: Normal breath sounds bilaterally, no wheezing, rales,rhonchi or crepitation. No use of accessory muscles of respiration.  CARDIOVASCULAR: S1, S2 normal. No murmurs, rubs, or gallops.  ABDOMEN: Soft,  Positive epigastric tenderness, nondistended. Bowel sounds present. No organomegaly or mass.  EXTREMITIES: No pedal edema, cyanosis, or clubbing.  NEUROLOGIC: Cranial nerves II through XII are intact. Muscle strength 5/5 in all extremities. Sensation intact. Gait not checked.  PSYCHIATRIC: awake but not oriented to place person or time SKIN: No obvious rash, lesion, or ulcer.   LABORATORY PANEL:   CBC  Recent Labs Lab 10/10/15 1127  WBC 9.6  HGB 14.2  HCT 40.9  PLT 179  MCV 92.5  MCH 32.0  MCHC 34.6  RDW 13.1  LYMPHSABS 0.5*  MONOABS 0.6  EOSABS 0.2  BASOSABS 0.0   ------------------------------------------------------------------------------------------------------------------  Chemistries   Recent Labs Lab 10/10/15 1127  NA 137  K 3.5  CL 99*  CO2 27  GLUCOSE 133*  BUN 23*  CREATININE 1.74*  CALCIUM 9.8  AST 22  ALT 12*  ALKPHOS 67  BILITOT 0.8   ------------------------------------------------------------------------------------------------------------------ estimated creatinine clearance is 23 mL/min (by C-G formula based on Cr of 1.74). ------------------------------------------------------------------------------------------------------------------ No results for input(s): TSH, T4TOTAL, T3FREE, THYROIDAB in the last 72 hours.  Invalid input(s): FREET3   Coagulation profile No results for input(s): INR, PROTIME in the last 168 hours. ------------------------------------------------------------------------------------------------------------------- No results for input(s): DDIMER in the last 72 hours. -------------------------------------------------------------------------------------------------------------------  Cardiac Enzymes No results  for input(s): CKMB, TROPONINI, MYOGLOBIN in the last 168 hours.  Invalid input(s): CK ------------------------------------------------------------------------------------------------------------------ Invalid input(s): POCBNP  ---------------------------------------------------------------------------------------------------------------  Urinalysis    Component Value Date/Time   COLORURINE YELLOW* 10/10/2015 1129   COLORURINE Yellow 01/31/2014 1418   APPEARANCEUR CLEAR* 10/10/2015 1129   APPEARANCEUR Clear 01/31/2014 1418   LABSPEC 1.012 10/10/2015 1129   LABSPEC 1.013 01/31/2014 1418   PHURINE 6.0 10/10/2015 1129   PHURINE 6.0 01/31/2014 1418   GLUCOSEU NEGATIVE 10/10/2015 1129   GLUCOSEU Negative 01/31/2014 1418   HGBUR NEGATIVE 10/10/2015 1129   HGBUR Negative 01/31/2014 1418   BILIRUBINUR NEGATIVE 10/10/2015 1129   BILIRUBINUR Negative 01/31/2014 1418   KETONESUR NEGATIVE 10/10/2015 1129   KETONESUR Negative 01/31/2014 1418   PROTEINUR NEGATIVE 10/10/2015 1129   PROTEINUR Negative 01/31/2014 1418   UROBILINOGEN 1.0 08/15/2014 1236   NITRITE NEGATIVE 10/10/2015 1129   NITRITE Negative 01/31/2014 1418   LEUKOCYTESUR 2+* 10/10/2015 1129   LEUKOCYTESUR Negative 01/31/2014 1418     RADIOLOGY: Dg Chest 1 View  10/10/2015  CLINICAL DATA:  Sepsis. EXAM: CHEST 1 VIEW COMPARISON:  08/06/2015 FINDINGS: Low lung volumes are noted. Chronic central peribronchial thickening noted. No evidence of pulmonary consolidation or edema. No evidence of pleural effusion. Heart size is stable. Left shoulder prosthesis again noted. IMPRESSION: Low lung volumes.  No acute findings. Electronically Signed   By: Earle Gell M.D.   On: 10/10/2015 12:29    EKG: Orders placed or performed during the hospital encounter of 10/10/15  . ED EKG 12-Lead  . ED EKG 12-Lead  . EKG 12-Lead  . EKG 12-Lead    IMPRESSION AND PLAN: patient is a 82year-old presenting with  Altered mental status and low grade  fever  1.   Acute encephalopathy-  Suspect due to dehydration and low grade fever I will give IVF, no source of fever hold antibiotics, h/o constipation check abd xray  2. ARF- stop hctz, ivf follow renal function  3. GERD continue maalox  4. Hypothyrodism continue synthroid  5. Depression continue celexa hold clonipine  6. Dementia not on any meds    All the records are reviewed and case discussed with ED provider. Management plans discussed with the patient, family and they are in agreement.  CODE STATUS:full no family avilable to discuss    Code Status Orders        Start     Ordered   10/10/15 1250  Full code   Continuous     10/10/15 1249    Code Status History    Date Active Date Inactive Code Status Order ID Comments User Context   08/06/2015 11:54 PM 08/09/2015  6:20 PM Full Code [id number]  Rise Patience, MD ED   08/23/2014  5:45 PM 08/27/2014  7:17 PM Full Code [id number]  Grier Mitts, PA-C Inpatient   05/31/2013  7:31 PM 06/06/2013  6:35 PM Full Code [id number]  Eustace Moore, MD Inpatient  TOTAL TIME TAKING CARE OF THIS PATIENT: 39minutes.    Dustin Flock M.D on 10/10/2015 at 12:54 PM  Between 7am to 6pm - Pager - [phone]  After 6pm go to [email or web address] - password [email or web address] [facility]  Office  [phone]  CC: Primary care physician; Radene Gunning, MD

## 2015-10-10 NOTE — Progress Notes (Signed)
Pt extremely agitated. Up on side of bed,kicking and fighting staff. Will add safety sitter and dr Tressia Miners called for medication to calm pt down. Heart rate up to 130 at that time

## 2015-10-10 NOTE — Clinical Social Work Note (Signed)
Clinical Social Work Assessment  Patient Details  Name: Karla Alexander MRN: [id number] Date of Birth: 31-Aug-1932  Date of referral:  10/10/15               Reason for consult:  Facility Placement, Other (Comment Required) (From [facility] )                Permission sought to share information with:  Chartered certified accountant granted to share information::  Yes, Verbal Permission Granted  Name::      [place]::   [place]   Relationship::     Contact Information:     Housing/Transportation Living arrangements for the past 2 months:  [place] of Information:  [place] Patient Interpreter Needed:  None Criminal Activity/Legal Involvement Pertinent to Current Situation/Hospitalization:  No - Comment as needed Significant Relationships:  Other Family Members Lives with:  Facility Resident Do you feel safe going back to the place where you live?  Yes Need for family participation in patient care:  Yes (Comment)  Care giving concerns:  Patient is a long term care resident at [facility].    Social Worker assessment / plan:  Holiday representative (CSW) received consult that patient is from [facility] and is a ward of the state. CSW contacted patient's [place] guardian Milana Kidney [phone] to discuss D/C plan. Per Tiffany patient is a ward of the state and a long term care resident at [facility]. Tiffany set up a password with RN and will share it with the appropriate people. Per Tiffany patient can return to [facility]. Per [facility] admissions coordinator at [facility] patient is on the ALF side and is private pay. CSW made [facility] aware that PT is pending. Per Marden Noble if patient needs to come to the SNF side [facility] authorization will have to be received. Patient has a current SNF [facility]. Per Marden Noble patient does not require an ALF [facility] if she comes back to ALF  because she is private pay. FL2 complete and faxed out. CSW will continue to follow and assist as needed.   Employment status:  Retired Nurse, adult PT Recommendations:  Not assessed at this time [facility] / Referral to community resources:  [place], Other (Comment Required) (ALF VS. SNF )  Patient/Family's Response to care:  Guardian is agreeable for patient to return to [facility].   Patient/Family's Understanding of and Emotional Response to Diagnosis, Current Treatment, and Prognosis:  Guardian was pleasant and thanked CSW for calling.   Emotional Assessment Appearance:  Appears stated age Attitude/Demeanor/Rapport:  Unable to Assess Affect (typically observed):  Unable to Assess Orientation:  Oriented to Self Alcohol / Substance use:  Not Applicable Psych involvement (Current and /or in the community):  No (Comment)  Discharge Needs  Concerns to be addressed:  Discharge Planning Concerns Readmission within the last 30 days:  No Current discharge risk:  Dependent with Mobility, Cognitively Impaired Barriers to Discharge:  Continued Medical Work up   [employer], Bronwen Betters, LCSW 10/10/2015, 3:55 PM

## 2015-10-10 NOTE — Progress Notes (Signed)

## 2015-10-10 NOTE — ED Notes (Signed)
Called code sepsis to carelink 1137

## 2015-10-11 LAB — CBC
HEMATOCRIT: 36.6 % (ref 35.0–47.0)
HEMOGLOBIN: 12.7 g/dL (ref 12.0–16.0)
MCH: 32.1 pg (ref 26.0–34.0)
MCHC: 34.8 g/dL (ref 32.0–36.0)
MCV: 92.4 fL (ref 80.0–100.0)
Platelets: 143 10*3/uL — ABNORMAL LOW (ref 150–440)
RBC: 3.96 MIL/uL (ref 3.80–5.20)
RDW: 13.1 % (ref 11.5–14.5)
WBC: 8.5 10*3/uL (ref 3.6–11.0)

## 2015-10-11 LAB — BASIC METABOLIC PANEL
Anion gap: 8 (ref 5–15)
BUN: 19 mg/dL (ref 6–20)
CHLORIDE: 107 mmol/L (ref 101–111)
CO2: 26 mmol/L (ref 22–32)
CREATININE: 1.38 mg/dL — AB (ref 0.44–1.00)
Calcium: 8.4 mg/dL — ABNORMAL LOW (ref 8.9–10.3)
GFR calc Af Amer: 40 mL/min — ABNORMAL LOW (ref >60)
GFR calc non Af Amer: 35 mL/min — ABNORMAL LOW (ref >60)
GLUCOSE: 99 mg/dL (ref 65–99)
Potassium: 3 mmol/L — ABNORMAL LOW (ref 3.5–5.1)
Sodium: 141 mmol/L (ref 135–145)

## 2015-10-11 LAB — MRSA PCR SCREENING: MRSA by PCR: NEGATIVE

## 2015-10-11 LAB — URINE CULTURE: Culture: 4000 — AB

## 2015-10-11 MED ORDER — PIPERACILLIN-TAZOBACTAM 3.375 G IVPB
3.3750 g | Freq: Three times a day (TID) | INTRAVENOUS | Status: DC
  Administered 2015-10-11 – 2015-10-12 (×4): 3.375 g via INTRAVENOUS
  Filled 2015-10-11 (×6): qty 50

## 2015-10-11 MED ORDER — RISPERIDONE 1 MG PO TABS
2.0000 mg | ORAL_TABLET | Freq: Every day | ORAL | Status: DC
  Administered 2015-10-11: 2 mg via ORAL
  Filled 2015-10-11: qty 2

## 2015-10-11 MED ORDER — AMOXICILLIN-POT CLAVULANATE 875-125 MG PO TABS: 1.0000 | ORAL_TABLET | Freq: Two times a day (BID) | ORAL | Status: DC

## 2015-10-11 MED ORDER — FAMOTIDINE 20 MG PO TABS
20.0000 mg | ORAL_TABLET | Freq: Every day | ORAL | Status: DC
  Administered 2015-10-11: 20 mg via ORAL
  Filled 2015-10-11: qty 1

## 2015-10-11 MED ORDER — POTASSIUM CHLORIDE IN NACL 40-0.9 MEQ/L-% IV SOLN
INTRAVENOUS | Status: DC
  Administered 2015-10-11: 125 mL/h via INTRAVENOUS
  Filled 2015-10-11 (×3): qty 1000

## 2015-10-11 MED ORDER — POTASSIUM CHLORIDE 20 MEQ PO PACK
40.0000 meq | PACK | Freq: Once | ORAL | Status: AC
  Administered 2015-10-11: 40 meq via ORAL
  Filled 2015-10-11: qty 2

## 2015-10-11 NOTE — NC FL2 (Signed)
[place] LEVEL OF CARE SCREENING TOOL     IDENTIFICATION  Patient Name: Karla Alexander Birthdate: 10-Apr-1932 Sex: female Admission Date (Current Location): 10/10/2015  [place] and [place] Number:  [name]  ([id number]) Facility and Address:  [facility], [address], [place], [place] [address]      Provider Number: [id number]  Attending Physician Name and Address:  Hillary Bow, MD  Relative Name and Phone Number:       Current Level of Care: Hospital Recommended Level of Care: [place] Prior Approval Number:    Date Approved/Denied:   PASRR Number:  ([id number])  Discharge Plan: SNF    Current Diagnoses: Patient Active Problem List   Diagnosis Date Noted  . Fever 10/10/2015  . Chronic diastolic congestive heart failure ([place]) 08/09/2015  . Dementia 08/07/2015  . Altered mental status 08/06/2015  . Acute encephalopathy 08/06/2015  . Hypothyroidism 08/06/2015  . HLD (hyperlipidemia) 08/06/2015  . Depression 08/06/2015  . Hypertension 08/06/2015  . Proximal humerus fracture 08/23/2014  . S/P lumbar spinal fusion 05/31/2013    Orientation RESPIRATION BLADDER Height & Weight     Self  Normal Continent Weight: 151 lb (68.493 kg) Height:  5\' 2"  (157.5 cm)  BEHAVIORAL SYMPTOMS/MOOD NEUROLOGICAL BOWEL NUTRITION STATUS   (none )  (none ) Continent Diet (Diet: Soft )  AMBULATORY STATUS COMMUNICATION OF NEEDS Skin   Extensive Assist Verbally Normal                       Personal Care Assistance Level of Assistance  Bathing, Feeding, Dressing Bathing Assistance: Limited assistance Feeding assistance: Independent Dressing Assistance: Limited assistance     Functional Limitations Info  Sight, Hearing, Speech Sight Info: Adequate Hearing Info: Adequate Speech Info: Adequate    SPECIAL CARE FACTORS FREQUENCY                       Contractures      Additional Factors Info  Code  Status, Allergies Code Status Info:  (Full Code. ) Allergies Info:  (No Known Allergies. )           Current Medications (10/11/2015):  This is the current hospital active medication list Current Facility-Administered Medications  Medication Dose Route Frequency Provider Last Rate Last Dose  . acetaminophen (TYLENOL) tablet 650 mg  650 mg Oral Q6H PRN Dustin Flock, MD   650 mg at 10/11/15 0926   Or  . acetaminophen (TYLENOL) suppository 650 mg  650 mg Rectal Q6H PRN Dustin Flock, MD   650 mg at 10/11/15 0045  . alum & mag hydroxide-simeth (MAALOX/MYLANTA) 200-200-20 MG/5ML suspension 30 mL  30 mL Oral Q4H PRN Dustin Flock, MD      . aspirin tablet 325 mg  325 mg Oral Daily Dustin Flock, MD   325 mg at 10/11/15 0926  . calcium citrate-vitamin D 500-400 MG-UNIT per chewable tablet 1 tablet  1 tablet Oral Daily Dustin Flock, MD   1 tablet at 10/11/15 0927  . cholecalciferol (VITAMIN D) tablet 1,000 Units  1,000 Units Oral Daily Dustin Flock, MD   1,000 Units at 10/11/15 0926  . citalopram (CELEXA) tablet 20 mg  20 mg Oral Daily Dustin Flock, MD   20 mg at 10/11/15 0926  . divalproex (DEPAKOTE) DR tablet 250 mg  250 mg Oral BID Dustin Flock, MD   250 mg at 10/11/15 0926  . enoxaparin (LOVENOX) injection 30 mg  30 mg  Subcutaneous Q24H Dustin Flock, MD   30 mg at 10/10/15 2123  . famotidine (PEPCID) tablet 20 mg  20 mg Oral QHS Dustin Flock, MD      . haloperidol lactate (HALDOL) injection 2 mg  2 mg Intravenous Q6H PRN Gladstone Lighter, MD   2 mg at 10/10/15 1820  . levothyroxine (SYNTHROID, LEVOTHROID) tablet 68.5 mcg  68.5 mcg Oral QAC breakfast Dustin Flock, MD   68.5 mcg at 10/11/15 0927  . metoprolol tartrate (LOPRESSOR) tablet 25 mg  25 mg Oral BID Dustin Flock, MD   25 mg at 10/11/15 0927  . ondansetron (ZOFRAN) tablet 4 mg  4 mg Oral Q6H PRN Dustin Flock, MD       Or  . ondansetron (ZOFRAN) injection 4 mg  4 mg Intravenous Q6H PRN Dustin Flock, MD      .  piperacillin-tazobactam (ZOSYN) IVPB 3.375 g  3.375 g Intravenous Q8H Srikar Sudini, MD   3.375 g at 10/11/15 0936  . polyethylene glycol (MIRALAX / GLYCOLAX) packet 17 g  17 g Oral Daily Dustin Flock, MD   17 g at 10/11/15 0927  . potassium chloride (KLOR-CON) packet 40 mEq  40 mEq Oral Once Srikar Sudini, MD      . pravastatin (PRAVACHOL) tablet 10 mg  10 mg Oral q1800 Dustin Flock, MD   10 mg at 10/10/15 2123  . risperiDONE (RISPERDAL) tablet 2 mg  2 mg Oral QHS Srikar Sudini, MD      . sodium chloride flush (NS) 0.9 % injection 3 mL  3 mL Intravenous Q12H Dustin Flock, MD   3 mL at 10/10/15 2124     Discharge Medications: Please see discharge summary for a list of discharge medications.  Relevant Imaging Results:  Relevant Lab Results:   Additional Information  (SSN: [id number])  Chiyo Fay, Veronia Beets, LCSW

## 2015-10-11 NOTE — Discharge Summary (Addendum)
[facility] [place] NAME: Karla Alexander    MR#:  [id number]  DATE OF BIRTH:  Aug 14, 1932  DATE OF ADMISSION:  10/10/2015 ADMITTING PHYSICIAN: Dustin Flock, MD  DATE OF DISCHARGE: 10/12/2015  PRIMARY CARE PHYSICIAN: Radene Gunning, MD   ADMISSION DIAGNOSIS:  Constipation [K59.00]  DISCHARGE DIAGNOSIS:  Active Problems:   Fever   SECONDARY DIAGNOSIS:   Past Medical History  Diagnosis Date  . Depression   . GERD (gastroesophageal reflux disease)   . Hypothyroidism   . Shortness of breath   . Arthritis   . Hypertension     dr Delsa Sale   [place]  . Coronary artery disease     non-obstructive CAD 04/2013  . Constipation   . Seasonal allergies   . Hyperlipemia   . Dementia      ADMITTING HISTORY  HISTORY OF PRESENT ILLNESS: Karla Anctil is a 82 y.o. female with a known history of demenia, depression, GERD, hypothyroidism, hyertension , nonobstructive coronary artery disease and constipation was sent to the ED with altered metal status and low grade fever. Patient currently Awake, but Confused due to her dementia. She appears To be very dry. Creatinine is elevated. Evaluation in the ER incled a chest x-ray which is negative. Her urine analysis is also negative. patient does have history of chronic constipaion.  HOSPITAL COURSE:   patient is a 82year-old presenting with Altered mental status and low grade fever  * Acute bronchitis versus aspiration with acute hypoxic respiratory failure Mild wheezing.  Started abx. Added prednisone. Nebs PRN Continue O2 at 2 L/Min  * Acute encephalopathy- Due to fever and dehydration. Had inpatient delirium.  * ARF- stopped hctz Resolved. Stop IVF  * GERD continue maalox  * Hypothyrodism continue synthroid  * Depression continue celexa hold clonipine  * Dementia not on any meds  Discharge back to nursing home  CONSULTS OBTAINED:     DRUG ALLERGIES:   No Known Allergies  DISCHARGE MEDICATIONS:   Current Discharge Medication List    START taking these medications   Details  predniSONE (DELTASONE) 50 MG tablet Take 1 tablet (50 mg total) by mouth daily with breakfast. Qty: 4 tablet, Refills: 0      CONTINUE these medications which have CHANGED   Details  amoxicillin-clavulanate (AUGMENTIN) 875-125 MG tablet Take 1 tablet by mouth every 12 (twelve) hours. Qty: 10 tablet, Refills: 0      CONTINUE these medications which have NOT CHANGED   Details  acetaminophen (TYLENOL) 650 MG CR tablet Take 650 mg by mouth every 4 (four) hours as needed for pain.    alendronate (FOSAMAX) 70 MG tablet Take 70 mg by mouth every Monday.     alum & mag hydroxide-simeth (MAALOX/MYLANTA) 200-200-20 MG/5ML suspension Take 30 mLs by mouth every 4 (four) hours as needed for indigestion or heartburn.    aspirin 325 MG tablet Take 325 mg by mouth daily.    Calcium Citrate-Vitamin D 250-200 MG-UNIT TABS Take 1 tablet by mouth 2 (two) times daily.    cholecalciferol (VITAMIN D) 1000 units tablet Take 1,000 Units by mouth daily.    citalopram (CELEXA) 20 MG tablet Take 20 mg by mouth daily.    clonazePAM (KLONOPIN) 0.5 MG tablet Take 0.5 mg by mouth 2 (two) times daily as needed for anxiety.     divalproex (DEPAKOTE) 250 MG DR tablet Take 250 mg by mouth 2 (two) times daily.    levothyroxine (SYNTHROID, LEVOTHROID) 137 MCG  tablet Take 0.5 tablets (68.5 mcg total) by mouth daily before breakfast. Qty: 30 tablet, Refills: 0    lovastatin (MEVACOR) 20 MG tablet Take 20 mg by mouth every evening.     metoprolol (LOPRESSOR) 50 MG tablet Take 25 mg by mouth 2 (two) times daily.    oxyCODONE-acetaminophen (ROXICET) 5-325 MG per tablet Take 1-2 tablets by mouth every 4 (four) hours as needed for severe pain. Qty: 60 tablet, Refills: 0    polyethylene glycol (MIRALAX / GLYCOLAX) packet Take 17 g by mouth daily.    ranitidine (ZANTAC) 150 MG tablet Take  150 mg by mouth daily.    triamterene-hydrochlorothiazide (DYAZIDE) 37.5-25 MG per capsule Take 1 capsule by mouth daily.    QUEtiapine (SEROQUEL) 25 MG tablet Take 0.5 tablets (12.5 mg total) by mouth at bedtime. Qty: 30 tablet, Refills: 0      STOP taking these medications     nitrofurantoin (MACRODANTIN) 100 MG capsule         Today   VITAL SIGNS:  Blood pressure 126/51, pulse 56, temperature 98.8 F (37.1 C), temperature source Axillary, resp. rate 16, height 5\' 2"  (1.575 m), weight 68.493 kg (151 lb), SpO2 98 %.  I/O:   Intake/Output Summary (Last 24 hours) at 10/11/15 1121 Last data filed at 10/11/15 0900  Gross per 24 hour  Intake   1600 ml  Output      0 ml  Net   1600 ml    PHYSICAL EXAMINATION:  Physical Exam  GENERAL:  82 y.o.-year-old patient lying in the bed with no acute distress.  LUNGS: Normal breath sounds bilaterally, no wheezing, rales,rhonchi or crepitation. No use of accessory muscles of respiration.  CARDIOVASCULAR: S1, S2 normal. No murmurs, rubs, or gallops.  ABDOMEN: Soft, non-tender, non-distended. Bowel sounds present. No organomegaly or mass.  NEUROLOGIC: Moves all 4 extremities. PSYCHIATRIC: The patient is alert and awake SKIN: No obvious rash, lesion, or ulcer.   DATA REVIEW:   CBC  Recent Labs Lab 10/11/15 0640  WBC 8.5  HGB 12.7  HCT 36.6  PLT 143*    Chemistries   Recent Labs Lab 10/10/15 1127 10/11/15 0640  NA 137 141  K 3.5 3.0*  CL 99* 107  CO2 27 26  GLUCOSE 133* 99  BUN 23* 19  CREATININE 1.74* 1.38*  CALCIUM 9.8 8.4*  AST 22  --   ALT 12*  --   ALKPHOS 67  --   BILITOT 0.8  --     Cardiac Enzymes No results for input(s): TROPONINI in the last 168 hours.  Microbiology Results  Results for orders placed or performed during the hospital encounter of 10/10/15  Blood Culture (routine x 2)     Status: None (Preliminary result)   Collection Time: 10/10/15 11:27 AM  Result Value Ref Range Status    Specimen Description BLOOD LEFT WRIST  Final   Special Requests BOTTLES DRAWN AEROBIC AND ANAEROBIC 3CCAERO,3CCANA  Final   Culture NO GROWTH < 24 HOURS  Final   Report Status PENDING  Incomplete  Blood Culture (routine x 2)     Status: None (Preliminary result)   Collection Time: 10/10/15 11:28 AM  Result Value Ref Range Status   Specimen Description BLOOD RIGHT ASSIST CONTROL  Final   Special Requests BOTTLES DRAWN AEROBIC AND ANAEROBIC [place]  Final   Culture NO GROWTH < 24 HOURS  Final   Report Status PENDING  Incomplete  MRSA PCR Screening     Status: None  Collection Time: 10/10/15 11:46 PM  Result Value Ref Range Status   MRSA by PCR NEGATIVE NEGATIVE Final    Comment:        The GeneXpert MRSA Assay (FDA approved for NASAL specimens only), is one component of a comprehensive MRSA colonization surveillance program. It is not intended to diagnose MRSA infection nor to guide or monitor treatment for MRSA infections.     RADIOLOGY:  Dg Chest 1 View  10/10/2015  CLINICAL DATA:  Sepsis. EXAM: CHEST 1 VIEW COMPARISON:  08/06/2015 FINDINGS: Low lung volumes are noted. Chronic central peribronchial thickening noted. No evidence of pulmonary consolidation or edema. No evidence of pleural effusion. Heart size is stable. Left shoulder prosthesis again noted. IMPRESSION: Low lung volumes.  No acute findings. Electronically Signed   By: Earle Gell M.D.   On: 10/10/2015 12:29   Dg Abd 1 View  10/10/2015  CLINICAL DATA:  Constipation and abdominal pain. EXAM: ABDOMEN - 1 VIEW COMPARISON:  None. FINDINGS: The bowel gas pattern is nonobstructive. Moderate volume of stool in the ascending and transverse colon is noted. No abnormal abdominal calcification or focal bony abnormality. The patient is status post lower lumbar fusion. IMPRESSION: Moderate volume of stool ascending and transverse colon. No acute abnormality. Electronically Signed   By: Inge Rise M.D.   On: 10/10/2015  13:48    Follow up with PCP in 1 week.  Management plans discussed with the patient, family and they are in agreement.  CODE STATUS:     Code Status Orders        Start     Ordered   10/10/15 1250  Full code   Continuous     10/10/15 1249    Code Status History    Date Active Date Inactive Code Status Order ID Comments User Context   08/06/2015 11:54 PM 08/09/2015  6:20 PM Full Code [id number]  Rise Patience, MD ED   08/23/2014  5:45 PM 08/27/2014  7:17 PM Full Code [id number]  Grier Mitts, PA-C Inpatient   05/31/2013  7:31 PM 06/06/2013  6:35 PM Full Code [id number]  Eustace Moore, MD Inpatient      TOTAL TIME TAKING CARE OF THIS PATIENT ON DAY OF DISCHARGE: more than 30 minutes.   Hillary Bow R M.D on 10/11/2015 at 11:21 AM  Between 7am to 6pm - Pager - [phone]  After 6pm go to [email or web address] - password [email or web address] [facility]  Office  [phone]  CC: Primary care physician; Radene Gunning, MD  Note: This dictation was prepared with Dragon dictation along with smaller phrase technology. Any transcriptional errors that result from this process are unintentional.

## 2015-10-11 NOTE — Progress Notes (Signed)
Plan is for patient to D/C to [facility] side for short term rehab. Per [facility] admissions coordinator at [facility] patient will go to room 513. Clinical Education officer, museum (CSW) sent D/C orders to [employer] via [employer] today. CSW left patient's DSS guardian a voicemail making her aware of above. [employer] [facility] case manager is aware of above. CSW will continue to follow and assist as needed.   [employer], LCSW [phone]

## 2015-10-11 NOTE — Progress Notes (Signed)
[facility] [place] NAME: Karla Alexander    MR#:  [id number]  DATE OF BIRTH:  1932/05/01  SUBJECTIVE:  CHIEF COMPLAINT:   Chief Complaint  Patient presents with  . Altered Mental Status   Pleasantly confused. Was extremely agitated overnight needing Haldol. MAXIMUM TEMPERATURE overnight of 100.4.  REVIEW OF SYSTEMS:    Review of Systems  Unable to perform ROS: dementia    DRUG ALLERGIES:  No Known Allergies  VITALS:  Blood pressure 126/51, pulse 56, temperature 98.8 F (37.1 C), temperature source Axillary, resp. rate 16, height 5\' 2"  (1.575 m), weight 68.493 kg (151 lb), SpO2 98 %.  PHYSICAL EXAMINATION:   Physical Exam  GENERAL:  82 y.o.-year-old patient lying in the bed with no acute distress.  EYES: Pupils equal, round, reactive to light and accommodation. No scleral icterus. Extraocular muscles intact.  HEENT: Head atraumatic, normocephalic. Oropharynx and nasopharynx clear.  NECK:  Supple, no jugular venous distention. No thyroid enlargement, no tenderness.  LUNGS: Increased work of breathing. Mild expiratory wheezing. CARDIOVASCULAR: S1, S2 normal. No murmurs, rubs, or gallops.  ABDOMEN: Soft, nontender, nondistended. Bowel sounds present. No organomegaly or mass.  EXTREMITIES: No cyanosis, clubbing or edema b/l.    NEUROLOGIC:  No focal Motor or sensory deficits b/l. PSYCHIATRIC: The patient is awake and alert SKIN: No obvious rash, lesion, or ulcer.   LABORATORY PANEL:   CBC  Recent Labs Lab 10/11/15 0640  WBC 8.5  HGB 12.7  HCT 36.6  PLT 143*   ------------------------------------------------------------------------------------------------------------------ Chemistries   Recent Labs Lab 10/10/15 1127 10/11/15 0640  NA 137 141  K 3.5 3.0*  CL 99* 107  CO2 27 26  GLUCOSE 133* 99  BUN 23* 19  CREATININE 1.74* 1.38*  CALCIUM 9.8 8.4*  AST 22  --   ALT 12*  --   ALKPHOS 67  --   BILITOT  0.8  --    ------------------------------------------------------------------------------------------------------------------  Cardiac Enzymes No results for input(s): TROPONINI in the last 168 hours. ------------------------------------------------------------------------------------------------------------------  RADIOLOGY:  Dg Chest 1 View  10/10/2015  CLINICAL DATA:  Sepsis. EXAM: CHEST 1 VIEW COMPARISON:  08/06/2015 FINDINGS: Low lung volumes are noted. Chronic central peribronchial thickening noted. No evidence of pulmonary consolidation or edema. No evidence of pleural effusion. Heart size is stable. Left shoulder prosthesis again noted. IMPRESSION: Low lung volumes.  No acute findings. Electronically Signed   By: Earle Gell M.D.   On: 10/10/2015 12:29   Dg Abd 1 View  10/10/2015  CLINICAL DATA:  Constipation and abdominal pain. EXAM: ABDOMEN - 1 VIEW COMPARISON:  None. FINDINGS: The bowel gas pattern is nonobstructive. Moderate volume of stool in the ascending and transverse colon is noted. No abnormal abdominal calcification or focal bony abnormality. The patient is status post lower lumbar fusion. IMPRESSION: Moderate volume of stool ascending and transverse colon. No acute abnormality. Electronically Signed   By: Inge Rise M.D.   On: 10/10/2015 13:48     ASSESSMENT AND PLAN:   patient is an 82year-old presenting with Altered mental status and low grade fever  * Acute bronchitis versus aspiration with acute hypoxic respiratory failure Mild wheezing. This is likely cause of her fever. Start abx. Add prednisone. Nebs PRN Wean O2  * Acute encephalopathy- Due to fever and dehydration. Now Has inpatient delirium. We'll start risperidone at night.  * ARF- stopped hctz Resolved. Stop IVF  * GERD continue maalox  * Hypothyrodism continue synthroid  *  Depression continue celexa hold clonipine  * Dementia not on any meds  All the records are reviewed and case  discussed with Care Management/Social Workerr. Management plans discussed with the patient, family and they are in agreement.  CODE STATUS: Full  DVT Prophylaxis: SCDs  TOTAL TIME TAKING CARE OF THIS PATIENT: 30 minutes.   POSSIBLE D/C IN 1-2 DAYS, DEPENDING ON CLINICAL CONDITION.  Hillary Bow R M.D on 10/11/2015 at 10:45 AM  Between 7am to 6pm - Pager - [phone]  After 6pm go to [email or web address] - password [email or web address] [facility]  Office  [phone]  CC: Primary care physician; Radene Gunning, MD  Note: This dictation was prepared with Dragon dictation along with smaller phrase technology. Any transcriptional errors that result from this process are unintentional.

## 2015-10-11 NOTE — Consult Note (Signed)
   [facility] Inpatient Consult   10/11/2015  [place] B Stemm Jun 04, 1932 [id number]   Patient screened for potential [facility] services. Patient is eligible for [place]. Electronic medical record reveals patient's discharge plan is to return to skilled nursing facility. [facility] Care Management services not appropriate at this time. If patient's post hospital needs change please place a [facility] Care Management consult. For questions please contact:   Kajuana Shareef RN, [facility] Liaison  [phone]) Business Mobile [phone]) Toll free office

## 2015-10-11 NOTE — Clinical Social Work Placement (Signed)
   CLINICAL SOCIAL WORK PLACEMENT  NOTE  Date:  10/11/2015  Patient Details  Name: [place] B Fecher MRN: [id number] Date of Birth: 24-Jun-1932  Clinical Social Work is seeking post-discharge placement for this patient at the [place] level of care (*CSW will initial, date and re-position this form in  chart as items are completed):  Yes   Patient/family provided with [facility] Work Department's list of facilities offering this level of care within the geographic area requested by the patient (or if unable, by the patient's family).  Yes   Patient/family informed of their freedom to choose among providers that offer the needed level of care, that participate in Medicare, Medicaid or managed care program needed by the patient, have an available bed and are willing to accept the patient.  Yes   Patient/family informed of [facility]'s ownership interest in [facility] and [facility], as well as of the fact that they are under no obligation to receive care at these facilities.  PASRR submitted to EDS on       PASRR number received on       Existing PASRR number confirmed on 10/11/15     FL2 transmitted to all facilities in geographic area requested by pt/family on 10/11/15     FL2 transmitted to all facilities within larger geographic area on       Patient informed that his/her managed care company has contracts with or will negotiate with certain facilities, including the following:        Yes   Patient/family informed of bed offers received.  Patient chooses bed at  [facility] )     Physician recommends and patient chooses bed at      Patient to be transferred to   on  .  Patient to be transferred to facility by       Patient family notified on   of transfer.  Name of family member notified:        PHYSICIAN       Additional Comment:    _______________________________________________ Rayven Hendrickson, Veronia Beets, LCSW 10/11/2015,  3:44 PM

## 2015-10-11 NOTE — Evaluation (Signed)
Physical Therapy Evaluation Patient Details Name: Karla Alexander MRN: [id number] DOB: [birth year] Today's Date: 10/11/2015   History of Present Illness  82 y.o. female with a known history of demenia, depression, GERD, hypothyroidism, hyertension , nonobstructive coronary artery disease and constipation was sent to the ED with altered metal status and low grade fever. Patient currently Awake, but Confused due to her dementia.   Clinical Impression  Pt confused t/o session and initially did not want to get up but does finally agree that she should try.  She needs a lot of cuing and encouragement but is very weak and unsteady with standing and ultimately unable to ambulate or really safely take a few side shuffle steps w/o losing her balance and generally being unsafe.  Unsure as to how mobile she was in the ALF portion of her residence, but she is not safe to do ALF at this time and will need rehab to work on safety and mobility to get back to PLOF.      Follow Up Recommendations SNF    Equipment Recommendations       Recommendations for Other Services       Precautions / Restrictions Precautions Precautions: Fall Restrictions Weight Bearing Restrictions: No      Mobility  Bed Mobility Overal bed mobility: Needs Assistance Bed Mobility: Supine to Sit;Sit to Supine     Supine to sit: Min assist Sit to supine: Mod assist;Max assist   General bed mobility comments: Pt confused about instructions to get up/down in bed.  She appeared very weak and likely could not have gotten back into bed had she even understood what to do.  Transfers Overall transfer level: Needs assistance Equipment used: Rolling walker (2 wheeled) Transfers: Sit to/from Stand Sit to Stand: Min assist         General transfer comment: Pt is able to get to standing with walker, but needs some assist to shift weight forward and maintain standing balance  Ambulation/Gait Ambulation/Gait assistance: Max  assist Ambulation Distance (Feet): 2 Feet Assistive device: Rolling walker (2 wheeled)       General Gait Details: Pt shows some effort with trying to ambulate, but was only able to take a few small side shuffle steps along EOB, and is unable to safely step away from bed losing balance backward and even with heavy cuing and assist was not able to maintain upright balance.  Stairs            Wheelchair Mobility    Modified Rankin (Stroke Patients Only)       Balance Overall balance assessment: Needs assistance   Sitting balance-Leahy Scale: Fair       Standing balance-Leahy Scale: Poor                               Pertinent Vitals/Pain Pain Assessment: No/denies pain    Home Living Family/patient expects to be discharged to:: Skilled nursing facility                 Additional Comments: Lives at [facility]    Prior Function Level of Independence: Independent with assistive device(s)         Comments: apparently she could use a rolling walker w/o issue at her ALF     Hand Dominance        Extremity/Trunk Assessment   Upper Extremity Assessment: Generalized weakness;Difficult to assess due to impaired cognition (b/l shoulders with very  limited AROM)           Lower Extremity Assessment: Generalized weakness;Difficult to assess due to impaired cognition (b/l LEs appear to be grossly 3+/5)         Communication   Communication: Receptive difficulties;Expressive difficulties (confused t/o most of session)  Cognition Arousal/Alertness: Awake/alert (only vaguely oriented to self) Behavior During Therapy: Impulsive Overall Cognitive Status: Difficult to assess                      General Comments      Exercises        Assessment/Plan    PT Assessment Patient needs continued PT services  PT Diagnosis Difficulty walking;Generalized weakness;Altered mental status   PT Problem List Decreased  strength;Decreased range of motion;Decreased activity tolerance;Decreased balance;Decreased mobility;Decreased safety awareness;Decreased knowledge of use of DME  PT Treatment Interventions Gait training;DME instruction;Functional mobility training;Therapeutic activities;Balance training;Therapeutic exercise;Neuromuscular re-education;Patient/family education   PT Goals (Current goals can be found in the Care Plan section) Acute Rehab PT Goals Patient Stated Goal: go home PT Goal Formulation: Patient unable to participate in goal setting Time For Goal Achievement: 10/25/15 Potential to Achieve Goals: Fair    Frequency Min 2X/week   Barriers to discharge        Co-evaluation               End of Session Equipment Utilized During Treatment: Gait belt Activity Tolerance:  (confusion) Patient left: with bed alarm set;with call bell/phone within reach           Time: 1335-1352 PT Time Calculation (min) (ACUTE ONLY): 17 min   Charges:   PT Evaluation $PT Eval Low Complexity: 1 Procedure     PT G CodesKreg Shropshire, DPT 10/11/2015, 3:18 PM

## 2015-10-12 DIAGNOSIS — I5032 Chronic diastolic (congestive) heart failure: Secondary | ICD-10-CM | POA: Diagnosis not present

## 2015-10-12 DIAGNOSIS — N179 Acute kidney failure, unspecified: Secondary | ICD-10-CM | POA: Diagnosis not present

## 2015-10-12 DIAGNOSIS — J209 Acute bronchitis, unspecified: Secondary | ICD-10-CM | POA: Diagnosis not present

## 2015-10-12 DIAGNOSIS — F329 Major depressive disorder, single episode, unspecified: Secondary | ICD-10-CM | POA: Diagnosis not present

## 2015-10-12 DIAGNOSIS — K5900 Constipation, unspecified: Secondary | ICD-10-CM | POA: Diagnosis not present

## 2015-10-12 DIAGNOSIS — G894 Chronic pain syndrome: Secondary | ICD-10-CM | POA: Diagnosis not present

## 2015-10-12 DIAGNOSIS — I129 Hypertensive chronic kidney disease with stage 1 through stage 4 chronic kidney disease, or unspecified chronic kidney disease: Secondary | ICD-10-CM | POA: Diagnosis not present

## 2015-10-12 DIAGNOSIS — I2510 Atherosclerotic heart disease of native coronary artery without angina pectoris: Secondary | ICD-10-CM | POA: Diagnosis not present

## 2015-10-12 DIAGNOSIS — R410 Disorientation, unspecified: Secondary | ICD-10-CM | POA: Diagnosis not present

## 2015-10-12 DIAGNOSIS — R6889 Other general symptoms and signs: Secondary | ICD-10-CM | POA: Diagnosis not present

## 2015-10-12 DIAGNOSIS — G9340 Encephalopathy, unspecified: Secondary | ICD-10-CM | POA: Diagnosis not present

## 2015-10-12 DIAGNOSIS — F0390 Unspecified dementia without behavioral disturbance: Secondary | ICD-10-CM | POA: Diagnosis not present

## 2015-10-12 DIAGNOSIS — N182 Chronic kidney disease, stage 2 (mild): Secondary | ICD-10-CM | POA: Diagnosis not present

## 2015-10-12 DIAGNOSIS — E860 Dehydration: Secondary | ICD-10-CM | POA: Diagnosis not present

## 2015-10-12 DIAGNOSIS — J45901 Unspecified asthma with (acute) exacerbation: Secondary | ICD-10-CM | POA: Diagnosis not present

## 2015-10-12 DIAGNOSIS — E039 Hypothyroidism, unspecified: Secondary | ICD-10-CM | POA: Diagnosis not present

## 2015-10-12 DIAGNOSIS — R0902 Hypoxemia: Secondary | ICD-10-CM | POA: Diagnosis not present

## 2015-10-12 DIAGNOSIS — R509 Fever, unspecified: Secondary | ICD-10-CM | POA: Diagnosis not present

## 2015-10-12 DIAGNOSIS — E785 Hyperlipidemia, unspecified: Secondary | ICD-10-CM | POA: Diagnosis not present

## 2015-10-12 DIAGNOSIS — D649 Anemia, unspecified: Secondary | ICD-10-CM | POA: Diagnosis not present

## 2015-10-12 DIAGNOSIS — Z7401 Bed confinement status: Secondary | ICD-10-CM | POA: Diagnosis not present

## 2015-10-12 LAB — URINE CULTURE: Culture: 40000 — AB

## 2015-10-12 MED ORDER — PREDNISONE 50 MG PO TABS: 50.0000 mg | ORAL_TABLET | Freq: Every day | ORAL | Status: DC

## 2015-10-12 MED ORDER — PREDNISONE 50 MG PO TABS: 50.0000 mg | ORAL_TABLET | Freq: Once | ORAL | Status: DC

## 2015-10-12 NOTE — Progress Notes (Signed)
MD gave order to discontinue telemetry

## 2015-10-12 NOTE — Progress Notes (Signed)
Patient is ready for discharge to [facility].  Report called and pt to transfer via EMS

## 2015-10-12 NOTE — Clinical Social Work Note (Signed)
Pt is ready for discharge today to [facility]. [employer] [facility] Josem Kaufmann has been obtained [id number]). Facility has received discharge information and is ready for a weekend admission. CSW left a message for [facility] Cristy Folks, regarding discharge plan. RN will call report and EMS will provide transportation. CSW is signing off as no further needs identified.   Darden Dates, MSW, LCSW  Clinical Social Worker [phone]

## 2015-10-12 NOTE — Care Management Important Message (Signed)
Important Message  Patient Details  Name: Karla Alexander MRN: [id number] Date of Birth: 02-23-1933   Medicare Important Message Given:  Yes    Carles Collet, RN 10/12/2015, 11:50 AMImportant Message  Patient Details  Name: Karla B Chimento MRN: [id number] Date of Birth: 1932-06-15   Medicare Important Message Given:  Yes    Carles Collet, RN 10/12/2015, 11:50 AM

## 2015-10-15 DIAGNOSIS — J45901 Unspecified asthma with (acute) exacerbation: Secondary | ICD-10-CM | POA: Diagnosis not present

## 2015-10-15 DIAGNOSIS — E039 Hypothyroidism, unspecified: Secondary | ICD-10-CM | POA: Diagnosis not present

## 2015-10-15 DIAGNOSIS — G9340 Encephalopathy, unspecified: Secondary | ICD-10-CM | POA: Diagnosis not present

## 2015-10-15 DIAGNOSIS — E860 Dehydration: Secondary | ICD-10-CM | POA: Diagnosis not present

## 2015-10-15 DIAGNOSIS — F329 Major depressive disorder, single episode, unspecified: Secondary | ICD-10-CM | POA: Diagnosis not present

## 2015-10-15 DIAGNOSIS — F0390 Unspecified dementia without behavioral disturbance: Secondary | ICD-10-CM | POA: Diagnosis not present

## 2015-10-15 DIAGNOSIS — R0902 Hypoxemia: Secondary | ICD-10-CM | POA: Diagnosis not present

## 2015-10-15 LAB — CULTURE, BLOOD (ROUTINE X 2)
CULTURE: NO GROWTH
Culture: NO GROWTH

## 2015-11-12 DIAGNOSIS — I5032 Chronic diastolic (congestive) heart failure: Secondary | ICD-10-CM | POA: Diagnosis not present

## 2015-12-02 DIAGNOSIS — F39 Unspecified mood [affective] disorder: Secondary | ICD-10-CM | POA: Diagnosis not present

## 2015-12-02 DIAGNOSIS — F419 Anxiety disorder, unspecified: Secondary | ICD-10-CM | POA: Diagnosis not present

## 2015-12-02 DIAGNOSIS — F0390 Unspecified dementia without behavioral disturbance: Secondary | ICD-10-CM | POA: Diagnosis not present

## 2015-12-02 DIAGNOSIS — F339 Major depressive disorder, recurrent, unspecified: Secondary | ICD-10-CM | POA: Diagnosis not present

## 2016-02-07 DIAGNOSIS — I10 Essential (primary) hypertension: Secondary | ICD-10-CM | POA: Diagnosis not present

## 2016-02-07 DIAGNOSIS — E039 Hypothyroidism, unspecified: Secondary | ICD-10-CM | POA: Diagnosis not present

## 2016-02-07 DIAGNOSIS — J449 Chronic obstructive pulmonary disease, unspecified: Secondary | ICD-10-CM | POA: Diagnosis not present

## 2016-03-04 DIAGNOSIS — F39 Unspecified mood [affective] disorder: Secondary | ICD-10-CM | POA: Diagnosis not present

## 2016-03-04 DIAGNOSIS — F0390 Unspecified dementia without behavioral disturbance: Secondary | ICD-10-CM | POA: Diagnosis not present

## 2016-03-04 DIAGNOSIS — F339 Major depressive disorder, recurrent, unspecified: Secondary | ICD-10-CM | POA: Diagnosis not present

## 2016-03-04 DIAGNOSIS — F419 Anxiety disorder, unspecified: Secondary | ICD-10-CM | POA: Diagnosis not present

## 2016-04-03 DIAGNOSIS — M6281 Muscle weakness (generalized): Secondary | ICD-10-CM | POA: Diagnosis not present

## 2016-04-06 DIAGNOSIS — M6281 Muscle weakness (generalized): Secondary | ICD-10-CM | POA: Diagnosis not present

## 2016-04-07 DIAGNOSIS — M6281 Muscle weakness (generalized): Secondary | ICD-10-CM | POA: Diagnosis not present

## 2016-04-08 DIAGNOSIS — M6281 Muscle weakness (generalized): Secondary | ICD-10-CM | POA: Diagnosis not present

## 2016-04-10 DIAGNOSIS — M6281 Muscle weakness (generalized): Secondary | ICD-10-CM | POA: Diagnosis not present

## 2016-04-11 DIAGNOSIS — M6281 Muscle weakness (generalized): Secondary | ICD-10-CM | POA: Diagnosis not present

## 2016-04-13 DIAGNOSIS — M6281 Muscle weakness (generalized): Secondary | ICD-10-CM | POA: Diagnosis not present

## 2016-04-14 DIAGNOSIS — M6281 Muscle weakness (generalized): Secondary | ICD-10-CM | POA: Diagnosis not present

## 2016-04-15 DIAGNOSIS — M6281 Muscle weakness (generalized): Secondary | ICD-10-CM | POA: Diagnosis not present

## 2016-04-17 DIAGNOSIS — M6281 Muscle weakness (generalized): Secondary | ICD-10-CM | POA: Diagnosis not present

## 2016-04-20 DIAGNOSIS — M6281 Muscle weakness (generalized): Secondary | ICD-10-CM | POA: Diagnosis not present

## 2016-04-21 DIAGNOSIS — M6281 Muscle weakness (generalized): Secondary | ICD-10-CM | POA: Diagnosis not present

## 2016-04-22 DIAGNOSIS — M6281 Muscle weakness (generalized): Secondary | ICD-10-CM | POA: Diagnosis not present

## 2016-04-23 DIAGNOSIS — M6281 Muscle weakness (generalized): Secondary | ICD-10-CM | POA: Diagnosis not present

## 2016-04-24 DIAGNOSIS — M6281 Muscle weakness (generalized): Secondary | ICD-10-CM | POA: Diagnosis not present

## 2016-04-27 DIAGNOSIS — M6281 Muscle weakness (generalized): Secondary | ICD-10-CM | POA: Diagnosis not present

## 2016-04-28 DIAGNOSIS — M6281 Muscle weakness (generalized): Secondary | ICD-10-CM | POA: Diagnosis not present

## 2016-04-29 DIAGNOSIS — M6281 Muscle weakness (generalized): Secondary | ICD-10-CM | POA: Diagnosis not present

## 2016-04-30 DIAGNOSIS — M6281 Muscle weakness (generalized): Secondary | ICD-10-CM | POA: Diagnosis not present

## 2016-05-01 DIAGNOSIS — M6281 Muscle weakness (generalized): Secondary | ICD-10-CM | POA: Diagnosis not present

## 2016-05-04 DIAGNOSIS — M6281 Muscle weakness (generalized): Secondary | ICD-10-CM | POA: Diagnosis not present

## 2016-05-05 DIAGNOSIS — M6281 Muscle weakness (generalized): Secondary | ICD-10-CM | POA: Diagnosis not present

## 2016-05-06 DIAGNOSIS — M6281 Muscle weakness (generalized): Secondary | ICD-10-CM | POA: Diagnosis not present

## 2016-05-07 DIAGNOSIS — M6281 Muscle weakness (generalized): Secondary | ICD-10-CM | POA: Diagnosis not present

## 2016-05-12 DIAGNOSIS — F339 Major depressive disorder, recurrent, unspecified: Secondary | ICD-10-CM | POA: Diagnosis not present

## 2016-05-12 DIAGNOSIS — G308 Other Alzheimer's disease: Secondary | ICD-10-CM | POA: Diagnosis not present

## 2016-05-12 DIAGNOSIS — F0280 Dementia in other diseases classified elsewhere without behavioral disturbance: Secondary | ICD-10-CM | POA: Diagnosis not present

## 2016-05-12 DIAGNOSIS — F39 Unspecified mood [affective] disorder: Secondary | ICD-10-CM | POA: Diagnosis not present

## 2016-06-03 DIAGNOSIS — F0280 Dementia in other diseases classified elsewhere without behavioral disturbance: Secondary | ICD-10-CM | POA: Diagnosis not present

## 2016-06-03 DIAGNOSIS — D649 Anemia, unspecified: Secondary | ICD-10-CM | POA: Diagnosis not present

## 2016-06-03 DIAGNOSIS — I10 Essential (primary) hypertension: Secondary | ICD-10-CM | POA: Diagnosis not present

## 2016-06-04 DIAGNOSIS — N390 Urinary tract infection, site not specified: Secondary | ICD-10-CM | POA: Diagnosis not present

## 2016-06-04 DIAGNOSIS — R319 Hematuria, unspecified: Secondary | ICD-10-CM | POA: Diagnosis not present

## 2016-06-18 DIAGNOSIS — K219 Gastro-esophageal reflux disease without esophagitis: Secondary | ICD-10-CM | POA: Diagnosis not present

## 2016-06-18 DIAGNOSIS — D649 Anemia, unspecified: Secondary | ICD-10-CM | POA: Diagnosis not present

## 2016-06-18 DIAGNOSIS — N182 Chronic kidney disease, stage 2 (mild): Secondary | ICD-10-CM | POA: Diagnosis not present

## 2016-06-19 DIAGNOSIS — I779 Disorder of arteries and arterioles, unspecified: Secondary | ICD-10-CM | POA: Diagnosis not present

## 2016-06-19 DIAGNOSIS — J449 Chronic obstructive pulmonary disease, unspecified: Secondary | ICD-10-CM | POA: Diagnosis not present

## 2016-06-19 DIAGNOSIS — F0280 Dementia in other diseases classified elsewhere without behavioral disturbance: Secondary | ICD-10-CM | POA: Diagnosis not present

## 2016-06-19 DIAGNOSIS — F3340 Major depressive disorder, recurrent, in remission, unspecified: Secondary | ICD-10-CM | POA: Diagnosis not present

## 2016-06-19 DIAGNOSIS — E039 Hypothyroidism, unspecified: Secondary | ICD-10-CM | POA: Diagnosis not present

## 2016-06-19 DIAGNOSIS — G301 Alzheimer's disease with late onset: Secondary | ICD-10-CM | POA: Diagnosis not present

## 2016-06-19 DIAGNOSIS — I10 Essential (primary) hypertension: Secondary | ICD-10-CM | POA: Diagnosis not present

## 2016-06-22 ENCOUNTER — Other Ambulatory Visit: Payer: Self-pay | Admitting: *Deleted

## 2016-06-22 DIAGNOSIS — I129 Hypertensive chronic kidney disease with stage 1 through stage 4 chronic kidney disease, or unspecified chronic kidney disease: Secondary | ICD-10-CM | POA: Diagnosis not present

## 2016-06-22 DIAGNOSIS — D649 Anemia, unspecified: Secondary | ICD-10-CM | POA: Diagnosis not present

## 2016-06-22 NOTE — Patient Outreach (Signed)
[facility]) Care Management  06/22/2016  Karla Alexander [birth year] [id number]   Spoke with Drue Novel, SW at facility. She reports patient is a LTC resident at facility. No plans to discharge.  Plan to sign off as no [facility] care management needs assessed at this time. Royetta Crochet. Laymond Purser, RN, BSN, House [phone]) Business Cell  [phone]) Toll Free Office

## 2016-06-29 DIAGNOSIS — N182 Chronic kidney disease, stage 2 (mild): Secondary | ICD-10-CM | POA: Diagnosis not present

## 2016-06-29 DIAGNOSIS — D649 Anemia, unspecified: Secondary | ICD-10-CM | POA: Diagnosis not present

## 2016-07-21 DIAGNOSIS — F39 Unspecified mood [affective] disorder: Secondary | ICD-10-CM | POA: Diagnosis not present

## 2016-07-21 DIAGNOSIS — F419 Anxiety disorder, unspecified: Secondary | ICD-10-CM | POA: Diagnosis not present

## 2016-07-21 DIAGNOSIS — G308 Other Alzheimer's disease: Secondary | ICD-10-CM | POA: Diagnosis not present

## 2016-07-21 DIAGNOSIS — F339 Major depressive disorder, recurrent, unspecified: Secondary | ICD-10-CM | POA: Diagnosis not present

## 2016-07-29 DIAGNOSIS — I779 Disorder of arteries and arterioles, unspecified: Secondary | ICD-10-CM | POA: Diagnosis not present

## 2016-07-29 DIAGNOSIS — F0280 Dementia in other diseases classified elsewhere without behavioral disturbance: Secondary | ICD-10-CM | POA: Diagnosis not present

## 2016-07-29 DIAGNOSIS — I10 Essential (primary) hypertension: Secondary | ICD-10-CM | POA: Diagnosis not present

## 2016-07-29 DIAGNOSIS — G301 Alzheimer's disease with late onset: Secondary | ICD-10-CM | POA: Diagnosis not present

## 2016-07-29 DIAGNOSIS — J449 Chronic obstructive pulmonary disease, unspecified: Secondary | ICD-10-CM | POA: Diagnosis not present

## 2016-07-29 DIAGNOSIS — F3340 Major depressive disorder, recurrent, in remission, unspecified: Secondary | ICD-10-CM | POA: Diagnosis not present

## 2016-08-14 DIAGNOSIS — N179 Acute kidney failure, unspecified: Secondary | ICD-10-CM | POA: Diagnosis not present

## 2016-08-21 DIAGNOSIS — M6281 Muscle weakness (generalized): Secondary | ICD-10-CM | POA: Diagnosis not present

## 2016-08-21 DIAGNOSIS — I739 Peripheral vascular disease, unspecified: Secondary | ICD-10-CM | POA: Diagnosis not present

## 2016-08-21 DIAGNOSIS — B351 Tinea unguium: Secondary | ICD-10-CM | POA: Diagnosis not present

## 2016-08-21 DIAGNOSIS — R262 Difficulty in walking, not elsewhere classified: Secondary | ICD-10-CM | POA: Diagnosis not present

## 2016-09-09 DIAGNOSIS — E039 Hypothyroidism, unspecified: Secondary | ICD-10-CM | POA: Diagnosis not present

## 2016-09-09 DIAGNOSIS — F0280 Dementia in other diseases classified elsewhere without behavioral disturbance: Secondary | ICD-10-CM | POA: Diagnosis not present

## 2016-09-09 DIAGNOSIS — D649 Anemia, unspecified: Secondary | ICD-10-CM | POA: Diagnosis not present

## 2016-09-20 IMAGING — CR DG CHEST 2V
2 series · 2 of 2 positions shown · non-contrast
Comparison: May 15, 2013 chest radiograph; left humerus CT
July 13, 2014

CLINICAL DATA: Preoperative evaluation for reverse left shoulder
arthroplasty. Hypertension.

EXAM:
CHEST  2 VIEW

[w chest pa]
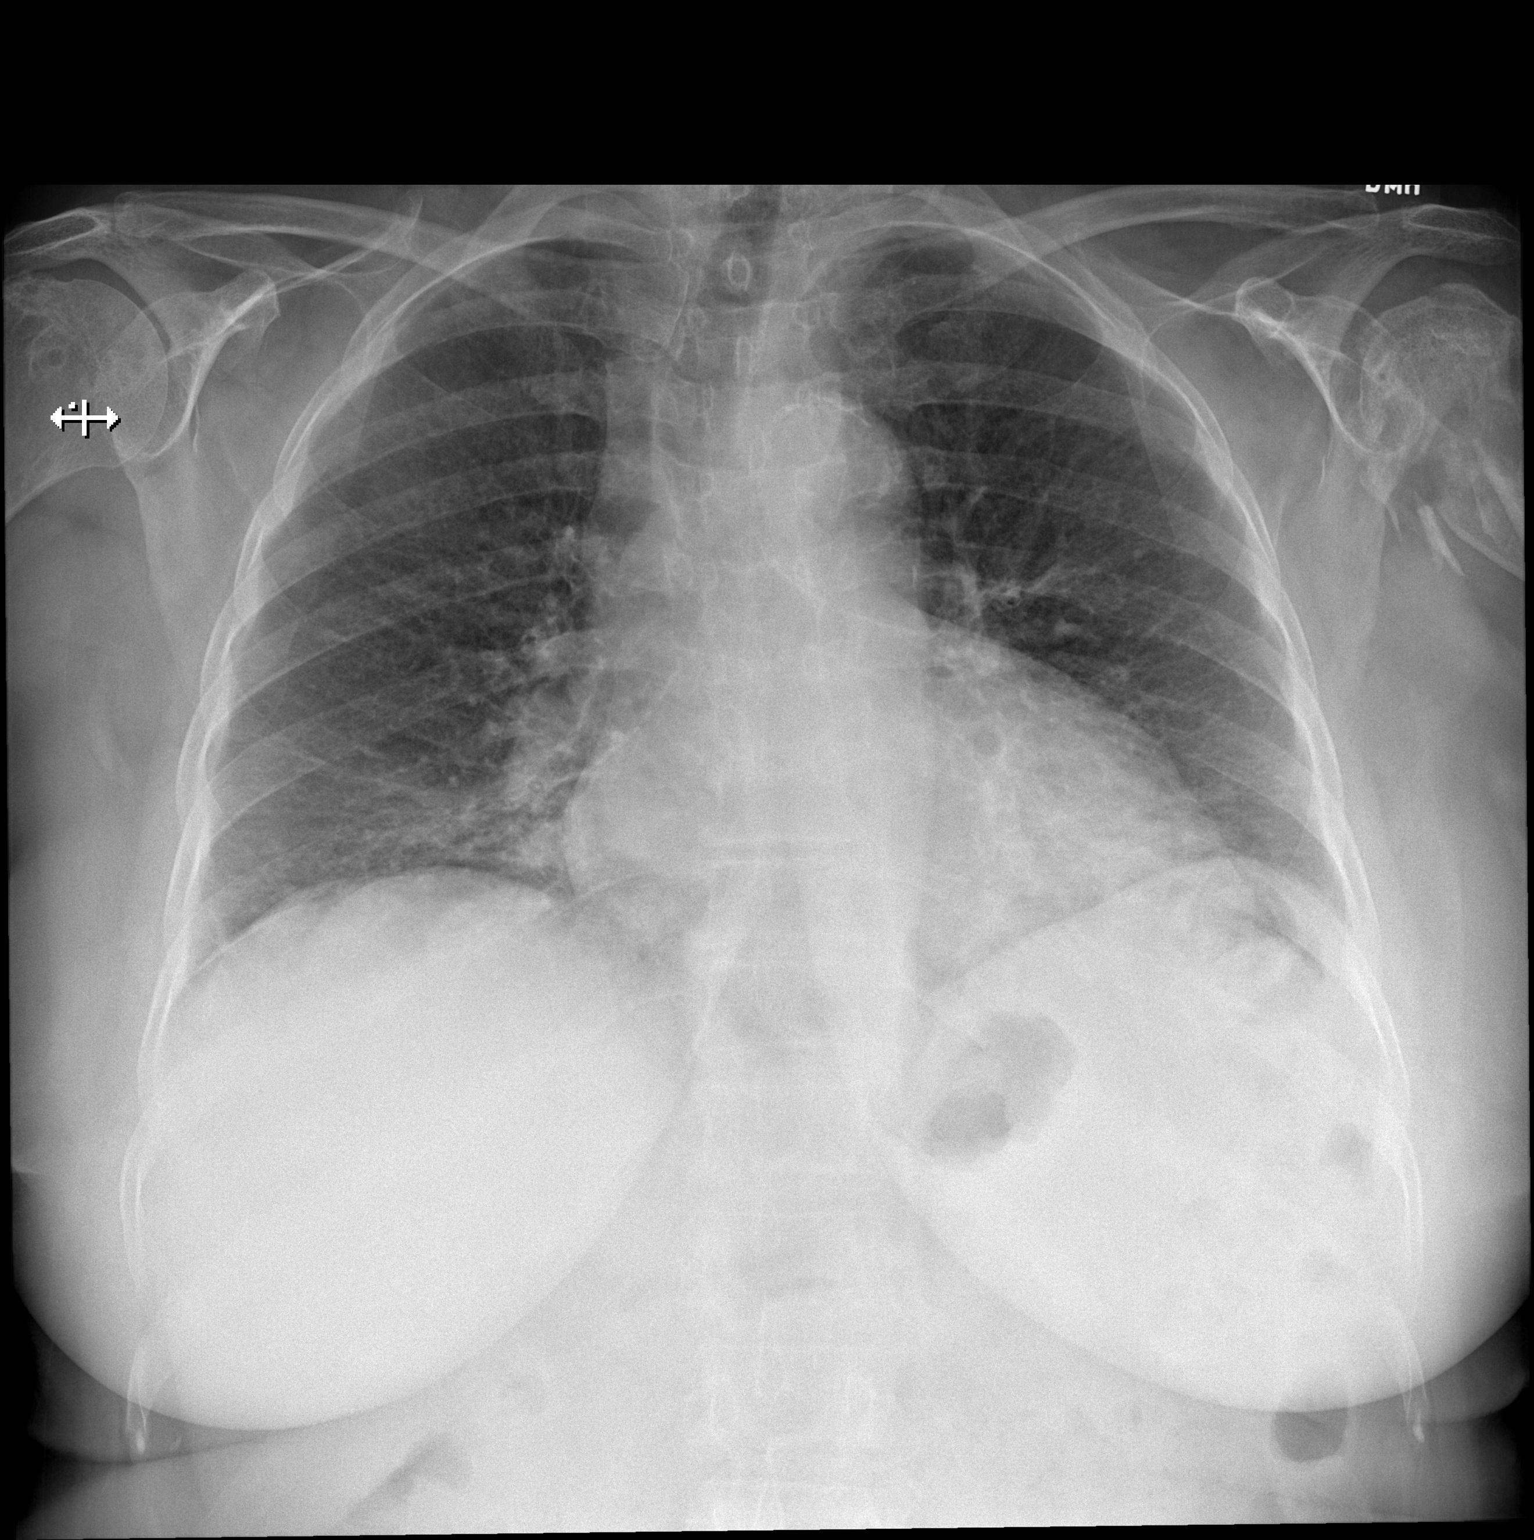

[w chest lat]
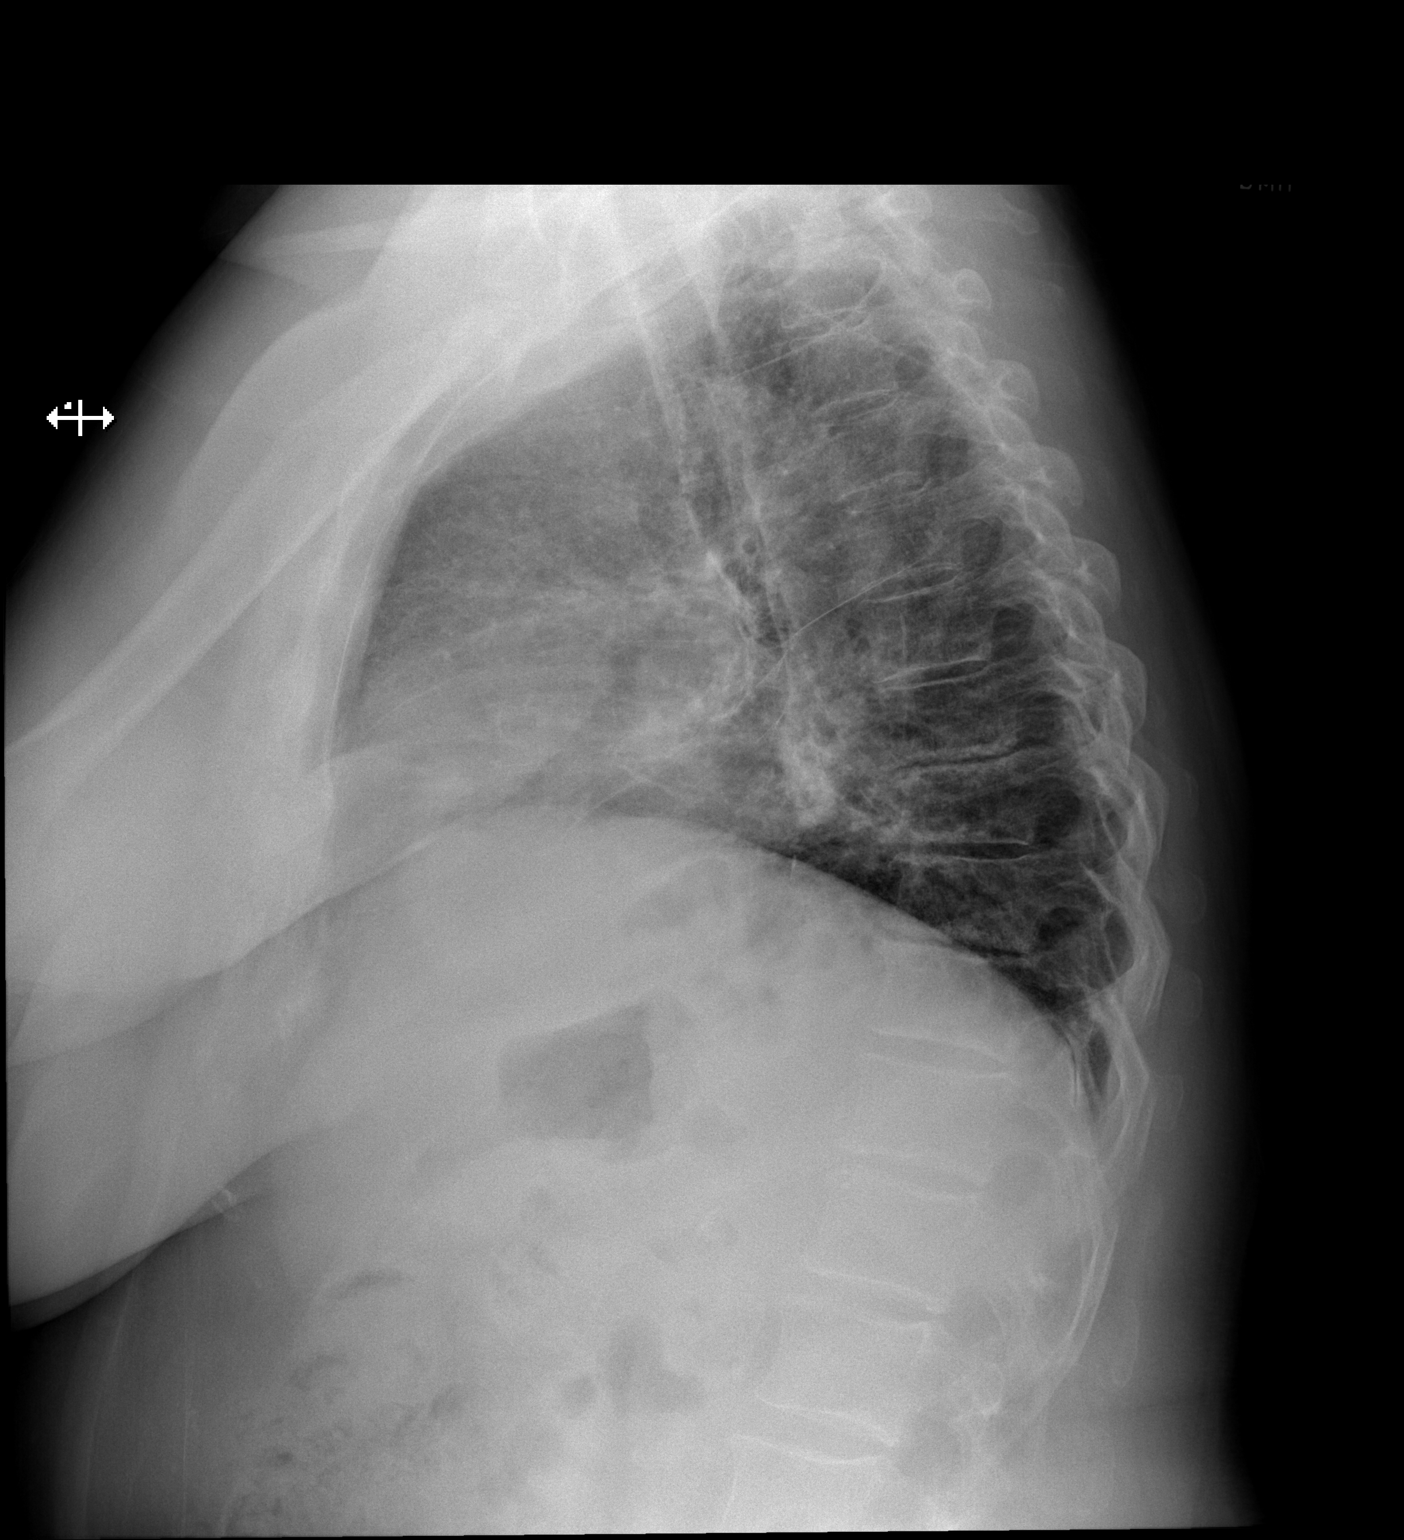

[2 of 2 positions shown; findings below may reference images not displayed]

FINDINGS: There is chronic central peribronchial thickening consistent with a
degree of chronic bronchitis. There is no frank edema or
consolidation. Heart is upper normal in size with pulmonary
vascularity within normal limits. No adenopathy. There is
atherosclerotic change in aorta. There is a comminuted fracture
proximal left humerus, noted previously.
IMPRESSION: Evidence of chronic bronchitis centrally on both sides. No edema or
consolidation. Old fracture proximal left humerus.

## 2016-09-28 IMAGING — CR DG HUMERUS 2V *L*
1 series · 1 of 1 positions shown · non-contrast
Comparison: CT 07/13/2014. Intraoperative radiographs earlier
today.

CLINICAL DATA: Postop left shoulder arthroplasty.

EXAM:
LEFT HUMERUS - 1 VIEW

[AP]
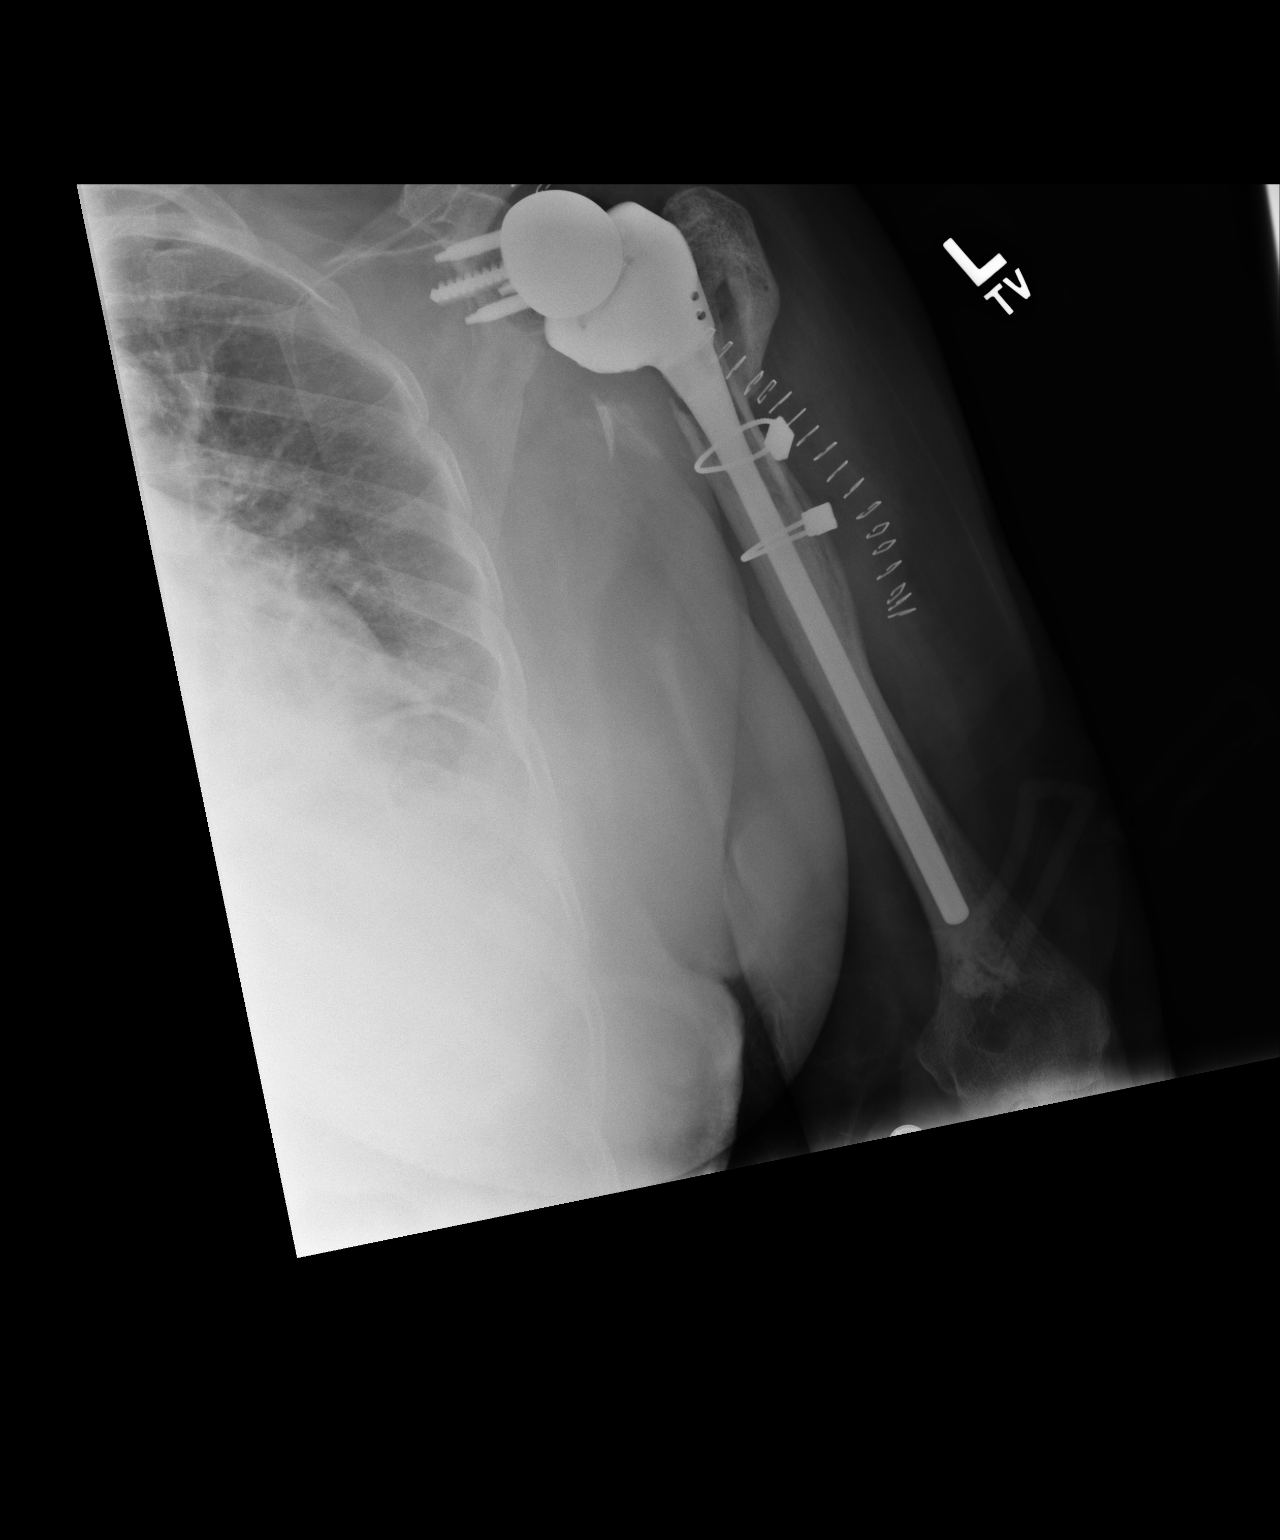

[1 of 1 positions shown; findings below may reference images not displayed]

FINDINGS: Single AP view obtained portably. Patient has undergone left
shoulder reverse arthroplasty with a long humeral prosthesis secured
by 2 cerclage wires. The glenoid component appears well positioned.
There is no evidence of acute fracture or dislocation on single AP
view. Surgical clips are in place.
IMPRESSION: No demonstrated complication following left shoulder reverse
arthroplasty.

## 2016-09-28 IMAGING — RF DG C-ARM GT 120 MIN
1 series · 2 of 2 positions shown · non-contrast
Comparison: 07/13/2014

CLINICAL DATA: Previous left shoulder arthroplasty

EXAM:
LEFT HUMERUS - 2+ VIEW; DG C-ARM GT 120 MIN

[Series 1: run · 2 of 2 slices shown]
[im 1/2]
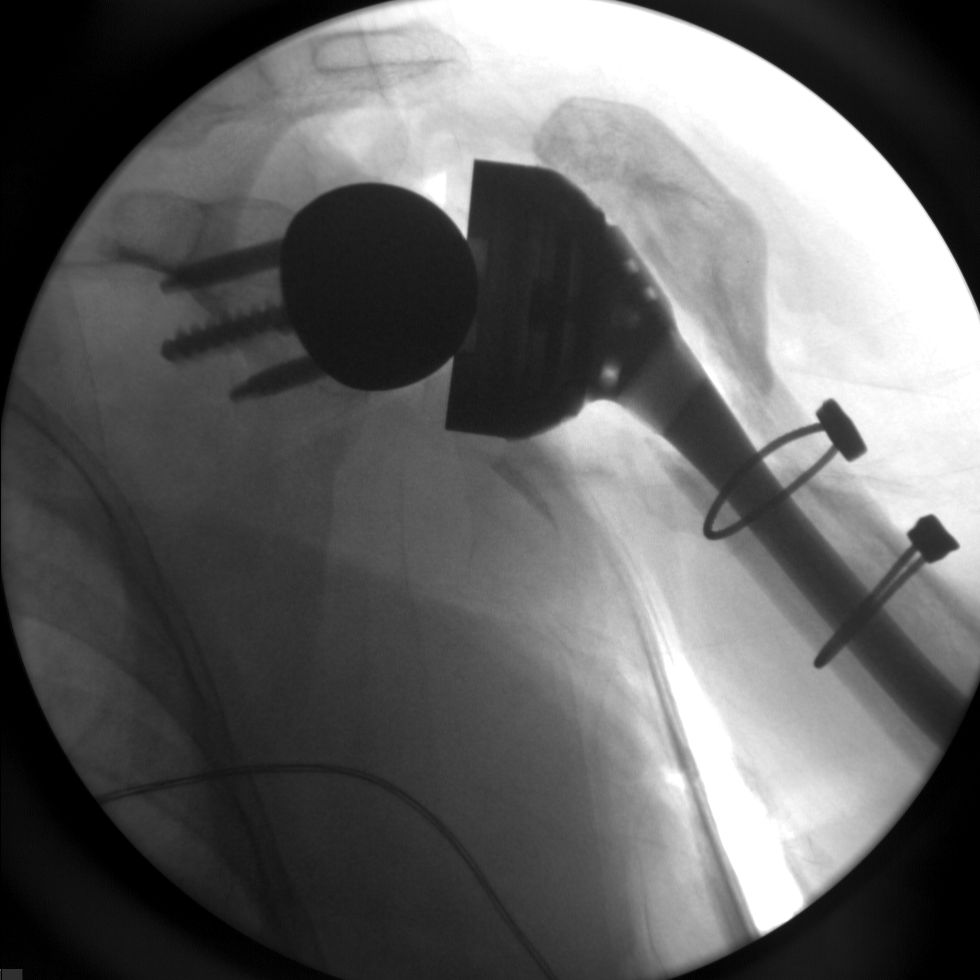
[im 2/2]
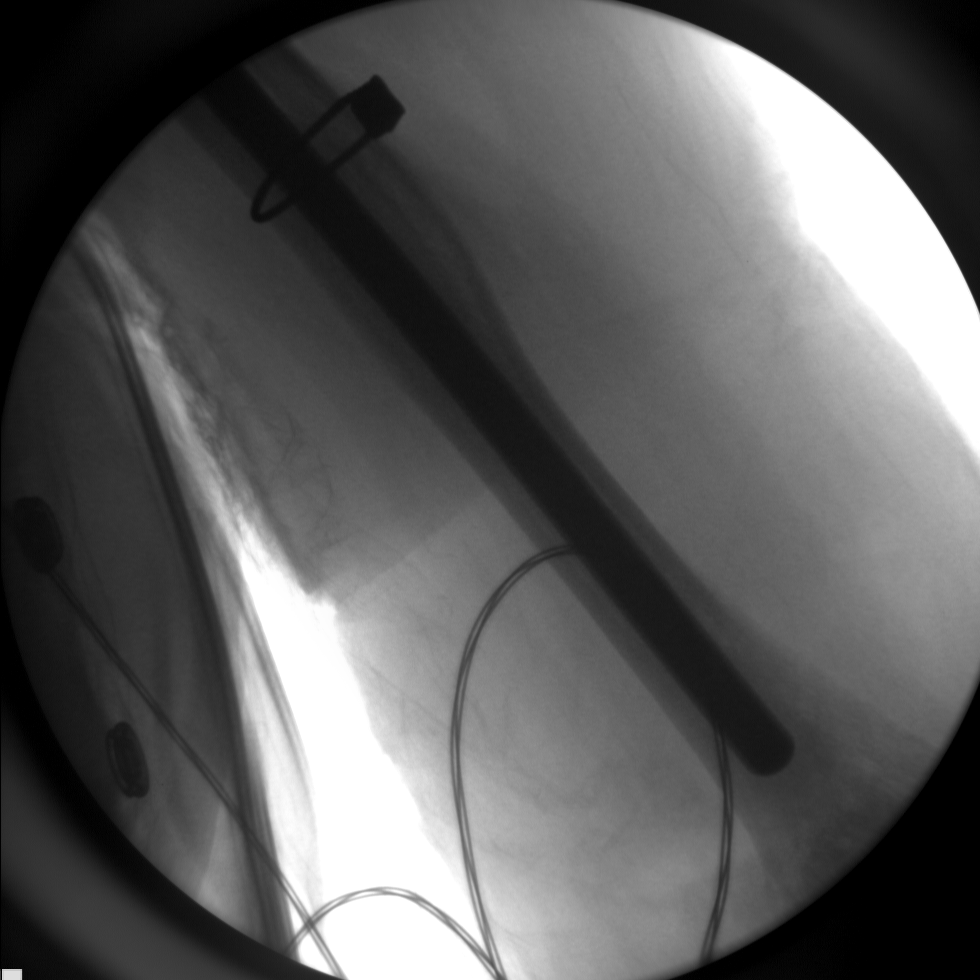

[2 of 2 positions shown; findings below may reference images not displayed]

FINDINGS: Two views of the left shoulder submitted. The patient is status post
intraoperative repair of left proximal humeral fracture. There is a
left shoulder prosthesis with a long intra medullary rod component.
There is anatomic alignment. Cerclage wires are noted in proximal
humerus.
IMPRESSION: Status post intraoperative repair of left humeral fracture. Left
shoulder prosthesis in anatomic alignment.

Fluoroscopy time was 14 seconds.

## 2016-10-13 DIAGNOSIS — F39 Unspecified mood [affective] disorder: Secondary | ICD-10-CM | POA: Diagnosis not present

## 2016-10-13 DIAGNOSIS — F339 Major depressive disorder, recurrent, unspecified: Secondary | ICD-10-CM | POA: Diagnosis not present

## 2016-10-13 DIAGNOSIS — F419 Anxiety disorder, unspecified: Secondary | ICD-10-CM | POA: Diagnosis not present

## 2016-10-13 DIAGNOSIS — G308 Other Alzheimer's disease: Secondary | ICD-10-CM | POA: Diagnosis not present

## 2016-10-21 DIAGNOSIS — F0280 Dementia in other diseases classified elsewhere without behavioral disturbance: Secondary | ICD-10-CM | POA: Diagnosis not present

## 2016-10-21 DIAGNOSIS — I10 Essential (primary) hypertension: Secondary | ICD-10-CM | POA: Diagnosis not present

## 2016-10-21 DIAGNOSIS — D649 Anemia, unspecified: Secondary | ICD-10-CM | POA: Diagnosis not present

## 2016-10-21 DIAGNOSIS — E039 Hypothyroidism, unspecified: Secondary | ICD-10-CM | POA: Diagnosis not present

## 2016-11-11 DIAGNOSIS — Z0000 Encounter for general adult medical examination without abnormal findings: Secondary | ICD-10-CM | POA: Diagnosis not present

## 2016-11-11 DIAGNOSIS — F3340 Major depressive disorder, recurrent, in remission, unspecified: Secondary | ICD-10-CM | POA: Diagnosis not present

## 2016-11-11 DIAGNOSIS — F0280 Dementia in other diseases classified elsewhere without behavioral disturbance: Secondary | ICD-10-CM | POA: Diagnosis not present

## 2016-11-11 DIAGNOSIS — E039 Hypothyroidism, unspecified: Secondary | ICD-10-CM | POA: Diagnosis not present

## 2016-11-11 DIAGNOSIS — G301 Alzheimer's disease with late onset: Secondary | ICD-10-CM | POA: Diagnosis not present

## 2016-11-11 DIAGNOSIS — I10 Essential (primary) hypertension: Secondary | ICD-10-CM | POA: Diagnosis not present

## 2016-11-11 DIAGNOSIS — J449 Chronic obstructive pulmonary disease, unspecified: Secondary | ICD-10-CM | POA: Diagnosis not present

## 2016-11-11 DIAGNOSIS — I2510 Atherosclerotic heart disease of native coronary artery without angina pectoris: Secondary | ICD-10-CM | POA: Diagnosis not present

## 2016-11-11 DIAGNOSIS — I779 Disorder of arteries and arterioles, unspecified: Secondary | ICD-10-CM | POA: Diagnosis not present

## 2016-12-10 DIAGNOSIS — E039 Hypothyroidism, unspecified: Secondary | ICD-10-CM | POA: Diagnosis not present

## 2017-01-21 DIAGNOSIS — E039 Hypothyroidism, unspecified: Secondary | ICD-10-CM | POA: Diagnosis not present

## 2017-01-21 DIAGNOSIS — K219 Gastro-esophageal reflux disease without esophagitis: Secondary | ICD-10-CM | POA: Diagnosis not present

## 2017-01-21 DIAGNOSIS — N182 Chronic kidney disease, stage 2 (mild): Secondary | ICD-10-CM | POA: Diagnosis not present

## 2017-01-25 DIAGNOSIS — E039 Hypothyroidism, unspecified: Secondary | ICD-10-CM | POA: Diagnosis not present

## 2017-01-26 DIAGNOSIS — F3340 Major depressive disorder, recurrent, in remission, unspecified: Secondary | ICD-10-CM | POA: Diagnosis not present

## 2017-01-26 DIAGNOSIS — G301 Alzheimer's disease with late onset: Secondary | ICD-10-CM | POA: Diagnosis not present

## 2017-01-26 DIAGNOSIS — F0280 Dementia in other diseases classified elsewhere without behavioral disturbance: Secondary | ICD-10-CM | POA: Diagnosis not present

## 2017-01-26 DIAGNOSIS — I779 Disorder of arteries and arterioles, unspecified: Secondary | ICD-10-CM | POA: Diagnosis not present

## 2017-01-26 DIAGNOSIS — I2510 Atherosclerotic heart disease of native coronary artery without angina pectoris: Secondary | ICD-10-CM | POA: Diagnosis not present

## 2017-01-26 DIAGNOSIS — E039 Hypothyroidism, unspecified: Secondary | ICD-10-CM | POA: Diagnosis not present

## 2017-01-26 DIAGNOSIS — J449 Chronic obstructive pulmonary disease, unspecified: Secondary | ICD-10-CM | POA: Diagnosis not present

## 2017-01-26 DIAGNOSIS — Z9861 Coronary angioplasty status: Secondary | ICD-10-CM | POA: Diagnosis not present

## 2017-02-10 DIAGNOSIS — E039 Hypothyroidism, unspecified: Secondary | ICD-10-CM | POA: Diagnosis not present

## 2017-03-05 DIAGNOSIS — F3340 Major depressive disorder, recurrent, in remission, unspecified: Secondary | ICD-10-CM | POA: Diagnosis not present

## 2017-03-05 DIAGNOSIS — G301 Alzheimer's disease with late onset: Secondary | ICD-10-CM | POA: Diagnosis not present

## 2017-03-05 DIAGNOSIS — J449 Chronic obstructive pulmonary disease, unspecified: Secondary | ICD-10-CM | POA: Diagnosis not present

## 2017-03-05 DIAGNOSIS — F0280 Dementia in other diseases classified elsewhere without behavioral disturbance: Secondary | ICD-10-CM | POA: Diagnosis not present

## 2017-03-05 DIAGNOSIS — Z0000 Encounter for general adult medical examination without abnormal findings: Secondary | ICD-10-CM | POA: Insufficient documentation

## 2017-03-05 DIAGNOSIS — Z9861 Coronary angioplasty status: Secondary | ICD-10-CM | POA: Diagnosis not present

## 2017-03-05 DIAGNOSIS — I779 Disorder of arteries and arterioles, unspecified: Secondary | ICD-10-CM | POA: Diagnosis not present

## 2017-03-05 DIAGNOSIS — I2510 Atherosclerotic heart disease of native coronary artery without angina pectoris: Secondary | ICD-10-CM | POA: Diagnosis not present

## 2017-03-26 DIAGNOSIS — E039 Hypothyroidism, unspecified: Secondary | ICD-10-CM | POA: Diagnosis not present

## 2017-03-26 DIAGNOSIS — N182 Chronic kidney disease, stage 2 (mild): Secondary | ICD-10-CM | POA: Diagnosis not present

## 2017-03-26 DIAGNOSIS — I10 Essential (primary) hypertension: Secondary | ICD-10-CM | POA: Diagnosis not present

## 2017-03-29 DIAGNOSIS — D649 Anemia, unspecified: Secondary | ICD-10-CM | POA: Diagnosis not present

## 2017-03-29 DIAGNOSIS — I130 Hypertensive heart and chronic kidney disease with heart failure and stage 1 through stage 4 chronic kidney disease, or unspecified chronic kidney disease: Secondary | ICD-10-CM | POA: Diagnosis not present

## 2017-04-27 DIAGNOSIS — R0981 Nasal congestion: Secondary | ICD-10-CM | POA: Diagnosis not present

## 2017-04-30 DIAGNOSIS — J40 Bronchitis, not specified as acute or chronic: Secondary | ICD-10-CM | POA: Diagnosis not present

## 2017-05-03 DIAGNOSIS — J40 Bronchitis, not specified as acute or chronic: Secondary | ICD-10-CM | POA: Diagnosis not present

## 2017-05-28 DIAGNOSIS — F3340 Major depressive disorder, recurrent, in remission, unspecified: Secondary | ICD-10-CM | POA: Diagnosis not present

## 2017-05-28 DIAGNOSIS — G301 Alzheimer's disease with late onset: Secondary | ICD-10-CM | POA: Diagnosis not present

## 2017-05-28 DIAGNOSIS — I2510 Atherosclerotic heart disease of native coronary artery without angina pectoris: Secondary | ICD-10-CM | POA: Diagnosis not present

## 2017-05-28 DIAGNOSIS — Z9861 Coronary angioplasty status: Secondary | ICD-10-CM | POA: Diagnosis not present

## 2017-05-28 DIAGNOSIS — J449 Chronic obstructive pulmonary disease, unspecified: Secondary | ICD-10-CM | POA: Diagnosis not present

## 2017-05-28 DIAGNOSIS — Z0000 Encounter for general adult medical examination without abnormal findings: Secondary | ICD-10-CM | POA: Diagnosis not present

## 2017-05-28 DIAGNOSIS — I10 Essential (primary) hypertension: Secondary | ICD-10-CM | POA: Diagnosis not present

## 2017-05-28 DIAGNOSIS — F0280 Dementia in other diseases classified elsewhere without behavioral disturbance: Secondary | ICD-10-CM | POA: Diagnosis not present

## 2017-05-28 DIAGNOSIS — I779 Disorder of arteries and arterioles, unspecified: Secondary | ICD-10-CM | POA: Diagnosis not present

## 2017-07-01 DIAGNOSIS — M48061 Spinal stenosis, lumbar region without neurogenic claudication: Secondary | ICD-10-CM | POA: Diagnosis not present

## 2017-07-19 DIAGNOSIS — M48061 Spinal stenosis, lumbar region without neurogenic claudication: Secondary | ICD-10-CM | POA: Diagnosis not present

## 2017-07-23 DIAGNOSIS — H6120 Impacted cerumen, unspecified ear: Secondary | ICD-10-CM | POA: Diagnosis not present

## 2017-07-23 DIAGNOSIS — R51 Headache: Secondary | ICD-10-CM | POA: Diagnosis not present

## 2017-08-02 DIAGNOSIS — D649 Anemia, unspecified: Secondary | ICD-10-CM | POA: Diagnosis not present

## 2017-08-02 DIAGNOSIS — K219 Gastro-esophageal reflux disease without esophagitis: Secondary | ICD-10-CM | POA: Diagnosis not present

## 2017-08-02 DIAGNOSIS — E039 Hypothyroidism, unspecified: Secondary | ICD-10-CM | POA: Diagnosis not present

## 2017-08-02 DIAGNOSIS — I10 Essential (primary) hypertension: Secondary | ICD-10-CM | POA: Diagnosis not present

## 2017-08-02 DIAGNOSIS — I130 Hypertensive heart and chronic kidney disease with heart failure and stage 1 through stage 4 chronic kidney disease, or unspecified chronic kidney disease: Secondary | ICD-10-CM | POA: Diagnosis not present

## 2017-08-02 DIAGNOSIS — E119 Type 2 diabetes mellitus without complications: Secondary | ICD-10-CM | POA: Diagnosis not present

## 2017-08-06 ENCOUNTER — Other Ambulatory Visit: Payer: Self-pay | Admitting: Orthopedic Surgery

## 2017-08-06 DIAGNOSIS — N289 Disorder of kidney and ureter, unspecified: Secondary | ICD-10-CM

## 2017-08-13 ENCOUNTER — Ambulatory Visit
Admission: RE | Admit: 2017-08-13 | Discharge: 2017-08-13 | Disposition: A | Discharge status: Home / Self Care | Payer: Medicare HMO | Source: Ambulatory Visit | Attending: Orthopedic Surgery | Admitting: Orthopedic Surgery

## 2017-08-13 DIAGNOSIS — N200 Calculus of kidney: Secondary | ICD-10-CM | POA: Insufficient documentation

## 2017-08-13 DIAGNOSIS — I700 Atherosclerosis of aorta: Secondary | ICD-10-CM | POA: Insufficient documentation

## 2017-08-13 DIAGNOSIS — R918 Other nonspecific abnormal finding of lung field: Secondary | ICD-10-CM | POA: Diagnosis not present

## 2017-08-13 DIAGNOSIS — N289 Disorder of kidney and ureter, unspecified: Secondary | ICD-10-CM | POA: Diagnosis present

## 2017-08-13 DIAGNOSIS — N2889 Other specified disorders of kidney and ureter: Secondary | ICD-10-CM | POA: Insufficient documentation

## 2017-08-13 LAB — POCT I-STAT CREATININE: CREATININE: 1 mg/dL (ref 0.44–1.00)

## 2017-08-13 MED ORDER — IOPAMIDOL (ISOVUE-300) INJECTION 61%
80.0000 mL | Freq: Once | INTRAVENOUS | Status: AC | PRN
  Administered 2017-08-13: 100 mL via INTRAVENOUS

## 2017-08-26 DIAGNOSIS — F0280 Dementia in other diseases classified elsewhere without behavioral disturbance: Secondary | ICD-10-CM | POA: Diagnosis not present

## 2017-08-26 DIAGNOSIS — G301 Alzheimer's disease with late onset: Secondary | ICD-10-CM | POA: Diagnosis not present

## 2017-08-26 DIAGNOSIS — I779 Disorder of arteries and arterioles, unspecified: Secondary | ICD-10-CM | POA: Diagnosis not present

## 2017-08-26 DIAGNOSIS — J449 Chronic obstructive pulmonary disease, unspecified: Secondary | ICD-10-CM | POA: Diagnosis not present

## 2017-08-26 DIAGNOSIS — M159 Polyosteoarthritis, unspecified: Secondary | ICD-10-CM | POA: Insufficient documentation

## 2017-08-26 DIAGNOSIS — F3340 Major depressive disorder, recurrent, in remission, unspecified: Secondary | ICD-10-CM | POA: Diagnosis not present

## 2017-08-26 DIAGNOSIS — M150 Primary generalized (osteo)arthritis: Secondary | ICD-10-CM

## 2017-09-02 DIAGNOSIS — N2889 Other specified disorders of kidney and ureter: Secondary | ICD-10-CM | POA: Diagnosis not present

## 2017-09-02 DIAGNOSIS — M549 Dorsalgia, unspecified: Secondary | ICD-10-CM | POA: Diagnosis not present

## 2017-09-14 ENCOUNTER — Ambulatory Visit: Payer: Self-pay | Admitting: Urology

## 2017-09-16 DIAGNOSIS — B351 Tinea unguium: Secondary | ICD-10-CM | POA: Diagnosis not present

## 2017-09-16 DIAGNOSIS — I739 Peripheral vascular disease, unspecified: Secondary | ICD-10-CM | POA: Diagnosis not present

## 2017-09-16 DIAGNOSIS — R262 Difficulty in walking, not elsewhere classified: Secondary | ICD-10-CM | POA: Diagnosis not present

## 2017-09-17 ENCOUNTER — Ambulatory Visit (INDEPENDENT_AMBULATORY_CARE_PROVIDER_SITE_OTHER): Payer: Medicare HMO | Admitting: Urology

## 2017-09-17 VITALS — BP 174/72 | HR 72 | Ht <= 58 in | Wt 160.6 lb

## 2017-09-17 DIAGNOSIS — I739 Peripheral vascular disease, unspecified: Secondary | ICD-10-CM

## 2017-09-17 DIAGNOSIS — M51369 Other intervertebral disc degeneration, lumbar region without mention of lumbar back pain or lower extremity pain: Secondary | ICD-10-CM | POA: Insufficient documentation

## 2017-09-17 DIAGNOSIS — N2889 Other specified disorders of kidney and ureter: Secondary | ICD-10-CM | POA: Diagnosis not present

## 2017-09-17 DIAGNOSIS — I779 Disorder of arteries and arterioles, unspecified: Secondary | ICD-10-CM | POA: Insufficient documentation

## 2017-09-17 DIAGNOSIS — E559 Vitamin D deficiency, unspecified: Secondary | ICD-10-CM | POA: Insufficient documentation

## 2017-09-17 DIAGNOSIS — M5440 Lumbago with sciatica, unspecified side: Secondary | ICD-10-CM | POA: Diagnosis not present

## 2017-09-17 DIAGNOSIS — M810 Age-related osteoporosis without current pathological fracture: Secondary | ICD-10-CM | POA: Insufficient documentation

## 2017-09-17 DIAGNOSIS — M5136 Other intervertebral disc degeneration, lumbar region: Secondary | ICD-10-CM | POA: Insufficient documentation

## 2017-09-17 DIAGNOSIS — I998 Other disorder of circulatory system: Secondary | ICD-10-CM | POA: Insufficient documentation

## 2017-09-17 NOTE — Progress Notes (Signed)
09/17/2017 11:06 AM   Karla Alexander 09/04/1932 [id number]  Referring provider: Radene Gunning, MD No address on file  Chief Complaint  Patient presents with  . Establish Care    HPI: 84 year old female patient who presents today for evaluation of an incidental right lower pole renal mass.  Karla Alexander presents today unaccompanied and is able to only provide a partial medical history today.  Additionally, she was referred by what appears to be orthopedics but we do not have records of this nor do we have the MRI.  She was presumably was seen and evaluated by orthopedics (unknown MD) for back pain radiating down her buttock and back of her thighs (based on her history).  She supposedly underwent some sort of imaging modality, possibly an MRI which is unavailable to [place] today.  This was followed up with a CT scan of the abdomen with and without contrast for further evaluation of the incidental renal mass.   CT scan shows a 5.7 x 4.6 x 5.1 cm avidly enhancing soft tissue mass with neovascularity involving the right lower pole of the kidney.  It appears to be encapsulated without evidence of renal vein invasion or lymphadenopathy.   She denies any overt flank pain or gross hematuria.  She has not lost weight.  She does have lower back pain radiating to her buttocks and bilateral posterior thighs.  This is exacerbated with movement.  She ambulates with a walker.  No family history of kidney cancer which she is aware.  She had two sisters die of cancer but she does not know what kind.  She has no local family.    Karla Alexander apparently has a personal history of Alzheimer's dementia and lives at a assisted living facility.  She has a legal guardian.  She denies having a history of dementia.  Her history is somewhat limited today.  She is unaccompanied today, no staff from the facility nor her legal guardian are present.  She does not perform all of her ADLs.  She does not cook or clean  for herself.  She does bathe herself with assistance as well as dress herself with assistance.  She is able to ambulate with a walker.   PMH: Past Medical History:  Diagnosis Date  . Arthritis   . Constipation   . Coronary artery disease    non-obstructive CAD 04/2013  . Dementia   . Depression   . GERD (gastroesophageal reflux disease)   . Hyperlipemia   . Hypertension    dr Delsa Sale   [place]  . Hypothyroidism   . Seasonal allergies   . Shortness of breath     Surgical History: Past Surgical History:  Procedure Laterality Date  . BACK SURGERY    . CARDIAC CATHETERIZATION  04/24/2013   20% mLAD, 20% OM1, 30% #1/60% #2 pRCA [facility]).  EF 68% by stress test 04/12/13.   Marland Kitchen MAXIMUM ACCESS (MAS)POSTERIOR LUMBAR INTERBODY FUSION (PLIF) 2 LEVEL N/A 05/31/2013   Procedure: FOR MAXIMUM ACCESS (MAS) POSTERIOR LUMBAR INTERBODY FUSION (PLIF) Lumbar Four-Five, Lumbar Five-Sacral One;  Surgeon: Eustace Moore, MD;  Location: [place] [facility];  Service: Neurosurgery;  Laterality: N/A;  FOR MAXIMUM ACCESS (MAS) POSTERIOR LUMBAR INTERBODY FUSION (PLIF) Lumbar Four-Five, Lumbar Five-Sacral One  . REVERSE SHOULDER ARTHROPLASTY Left 08/23/2014   Procedure: REVERSE SHOULDER ARTHROPLASTY WITH APPLICATION OF CABLES ;  Surgeon: Tania Ade, MD;  Location: [place];  Service: Orthopedics;  Laterality: Left;  Left reverse total shoulder arthroplasty  Home Medications:  Allergies as of 09/17/2017   No Known Allergies     Medication List        Accurate as of 09/17/17 11:06 AM. Always use your most recent med list.          acetaminophen 650 MG CR tablet Commonly known as:  TYLENOL Take 650 mg by mouth every 4 (four) hours as needed for pain.   alendronate 70 MG tablet Commonly known as:  FOSAMAX Take 70 mg by mouth every Monday.   alum & mag hydroxide-simeth 200-200-20 MG/5ML suspension Commonly known as:  MAALOX/MYLANTA Take 30 mLs by mouth every 4 (four) hours as needed for indigestion or  heartburn.   amoxicillin-clavulanate 875-125 MG tablet Commonly known as:  AUGMENTIN Take 1 tablet by mouth every 12 (twelve) hours.   aspirin 325 MG tablet Take 325 mg by mouth daily.   atorvastatin 20 MG tablet Commonly known as:  LIPITOR   Calcium Citrate-Vitamin D 250-200 MG-UNIT Tabs Take 1 tablet by mouth 2 (two) times daily.   cholecalciferol 1000 units tablet Commonly known as:  VITAMIN D Take 1,000 Units by mouth daily.   citalopram 20 MG tablet Commonly known as:  CELEXA Take 20 mg by mouth daily.   clonazePAM 0.5 MG tablet Commonly known as:  KLONOPIN Take 0.5 mg by mouth 2 (two) times daily as needed for anxiety.   diclofenac sodium 1 % Gel Commonly known as:  VOLTAREN   divalproex 250 MG DR tablet Commonly known as:  DEPAKOTE Take 250 mg by mouth 2 (two) times daily.   fluticasone 50 MCG/ACT nasal spray Commonly known as:  FLONASE   levothyroxine 137 MCG tablet Commonly known as:  SYNTHROID, LEVOTHROID Take 0.5 tablets (68.5 mcg total) by mouth daily before breakfast.   lovastatin 20 MG tablet Commonly known as:  MEVACOR Take 20 mg by mouth every evening.   metoprolol tartrate 50 MG tablet Commonly known as:  LOPRESSOR Take 25 mg by mouth 2 (two) times daily.   oxyCODONE-acetaminophen 5-325 MG tablet Commonly known as:  ROXICET Take 1-2 tablets by mouth every 4 (four) hours as needed for severe pain.   polyethylene glycol packet Commonly known as:  MIRALAX / GLYCOLAX Take 17 g by mouth daily.   potassium chloride SA 20 MEQ tablet Commonly known as:  K-DUR,KLOR-CON   pramipexole 0.125 MG tablet Commonly known as:  MIRAPEX   predniSONE 50 MG tablet Commonly known as:  DELTASONE Take 1 tablet (50 mg total) by mouth daily with breakfast.   QUEtiapine 25 MG tablet Commonly known as:  SEROQUEL Take 0.5 tablets (12.5 mg total) by mouth at bedtime.   ranitidine 150 MG tablet Commonly known as:  ZANTAC Take 150 mg by mouth daily.     traMADol 50 MG tablet Commonly known as:  ULTRAM   triamterene-hydrochlorothiazide 37.5-25 MG capsule Commonly known as:  DYAZIDE Take 1 capsule by mouth daily.   vitamin B-12 1000 MCG tablet Commonly known as:  CYANOCOBALAMIN Take by mouth.       Allergies: No Known Allergies  Family History: Family History  Problem Relation Age of Onset  . Hypertension Unknown     Social History:  reports that she has never smoked. She does not have any smokeless tobacco history on file. She reports that she does not drink alcohol or use drugs.  ROS: UROLOGY Frequent Urination?: No Hard to postpone urination?: No Burning/pain with urination?: Yes Get up at night to urinate?: Yes Leakage of urine?:  No Urine stream starts and stops?: Yes Trouble starting stream?: Yes Do you have to strain to urinate?: Yes Blood in urine?: No Urinary tract infection?: No Sexually transmitted disease?: No Injury to kidneys or bladder?: No Painful intercourse?: Yes Weak stream?: Yes Currently pregnant?: No Vaginal bleeding?: No Last menstrual period?: [place]  Gastrointestinal Nausea?: No Vomiting?: No Indigestion/heartburn?: No Diarrhea?: No Constipation?: No  Constitutional Fever: No Night sweats?: No Weight loss?: No Fatigue?: No  Skin Skin rash/lesions?: No Itching?: No  Eyes Blurred vision?: No Double vision?: No  Ears/Nose/Throat Sore throat?: No  Hematologic/Lymphatic Swollen glands?: No Easy bruising?: No  Cardiovascular Leg swelling?: No Chest pain?: No  Respiratory Shortness of breath?: No  Endocrine Excessive thirst?: No  Musculoskeletal Back pain?: Yes Joint pain?: Yes  Neurological Headaches?: No Dizziness?: No  Psychologic Depression?: Yes Anxiety?: Yes  Physical Exam: BP (!) 174/72 (BP Location: Left Arm, Patient Position: Sitting, Cuff Size: Large)   Pulse 72   Ht 4' 8.5" (1.435 m)   Wt 160 lb 9.6 oz (72.8 kg)   BMI 35.37 kg/m    Constitutional:  Alert and oriented, No acute distress.  Elderly.  Ambulating with walker.  Somewhat forgetful.   HEENT: [place] AT, moist mucus membranes.  Trachea midline, no masses. Cardiovascular: No clubbing, cyanosis, or edema. Respiratory: Normal respiratory effort, no increased work of breathing. GI: Abdomen is soft, nontender, nondistended, no abdominal masses GU: No CVA tenderness Skin: No rashes, bruises or suspicious lesions. Neurologic: Grossly intact, no focal deficits, moving all 4 extremities. Psychiatric: Normal mood and affect.  Laboratory Data: Lab Results  Component Value Date   WBC 8.5 10/11/2015   HGB 12.7 10/11/2015   HCT 36.6 10/11/2015   MCV 92.4 10/11/2015   PLT 143 (L) 10/11/2015    Lab Results  Component Value Date   CREATININE 1.00 08/13/2017    Urinalysis    Component Value Date/Time   COLORURINE YELLOW (A) 10/10/2015 1129   APPEARANCEUR CLEAR (A) 10/10/2015 1129   APPEARANCEUR Clear 01/31/2014 1418   LABSPEC 1.012 10/10/2015 1129   LABSPEC 1.013 01/31/2014 1418   PHURINE 6.0 10/10/2015 1129   GLUCOSEU NEGATIVE 10/10/2015 1129   GLUCOSEU Negative 01/31/2014 1418   HGBUR NEGATIVE 10/10/2015 1129   BILIRUBINUR NEGATIVE 10/10/2015 1129   BILIRUBINUR Negative 01/31/2014 1418   KETONESUR NEGATIVE 10/10/2015 1129   PROTEINUR NEGATIVE 10/10/2015 1129   UROBILINOGEN 1.0 08/15/2014 1236   NITRITE NEGATIVE 10/10/2015 1129   LEUKOCYTESUR 2+ (A) 10/10/2015 1129   LEUKOCYTESUR Negative 01/31/2014 1418    Lab Results  Component Value Date   BACTERIA NONE SEEN 10/10/2015    Pertinent Imaging: CLINICAL DATA:  84 year old female with history of intermittent in bilateral back pain for 1 year. Difficulty walking due to back pain.  EXAM: CT ABDOMEN WITHOUT AND WITH CONTRAST  TECHNIQUE: Multidetector CT imaging of the abdomen was performed following the standard protocol before and following the bolus administration of intravenous  contrast.  CONTRAST:  135mL ISOVUE-300 IOPAMIDOL (ISOVUE-300) INJECTION 61%  COMPARISON:  No priors.  FINDINGS: Lower chest: 4 mm right middle lobe nodule (axial image 4 of series 3). Small hiatal hernia.  Hepatobiliary: Trace amount of pneumobilia in the right-side of the liver, suggesting prior sphincterotomy. No suspicious cystic or solid hepatic lesions. No intra or extrahepatic biliary ductal dilatation. Gallbladder is unremarkable in appearance.  Pancreas: No pancreatic mass. No pancreatic ductal dilatation. No pancreatic or peripancreatic fluid or inflammatory changes.  Spleen: Unremarkable.  Adrenals/Urinary Tract: 3 mm nonobstructive calculus  in the interpolar collecting system of the right kidney. In the lower pole of the right kidney there is an avidly enhancing soft tissue mass measuring 5.7 x 4.6 x 5.1 cm, highly concerning for renal cell carcinoma. At this time, this appears encapsulated within Gerota's fascia and is separated from the right renal vein which remains widely patent. Neovascularity surrounding lesion is noted. Left kidney is normal in appearance. Right adrenal gland is normal. Mild nodularity of the left adrenal gland is noted without dominant nodule. No hydroureteronephrosis in the visualized portions of the abdomen.  Stomach/Bowel: Stomach is normal in appearance. No pathologic dilatation of visualized portions of small bowel or colon. The appendix is normal.  Vascular/Lymphatic: Aortic atherosclerosis, without evidence of aneurysm or dissection in the abdominal vasculature. No lymphadenopathy is noted in the abdomen.  Other: No significant volume of ascites and no pneumoperitoneum is noted in the visualized portions of the peritoneal cavity.  Musculoskeletal: No aggressive appearing osseous lesions are noted in the visualized portions of the skeleton. Orthopedic fixation hardware is noted in the lower lumbar spine, incompletely  imaged.  IMPRESSION: 1. Large avidly enhancing mass in the lower pole of the right kidney which is highly concerning for a renal cell carcinoma. At this time, this appears encapsulated within Gerota's fascia, does not involve the renal vein, and is not associated with lymphadenopathy or definite signs of metastatic disease in the abdomen. 2. Aortic atherosclerosis. 3. Tiny 4 mm right middle lobe nodule. This is nonspecific, but statistically likely benign. Attention on follow-up studies is recommended to exclude the possibility of a metastatic nodule. 4. Small amount of pneumobilia, presumably from prior sphincterotomy. Correlation with clinical history is recommended. 5. 3 mm nonobstructive calculus in the interpolar collecting system of the right kidney.   Electronically Signed   By: Vinnie Langton M.D.   On: 08/13/2017 16:16  CT scan was personally reviewed today.  Assessment & Plan:    1. Renal mass Large 5.6 cm right lower pole renal mass which appears to be localized to kidney Unfortunately, the patient carries a diagnosis of dementia and has a legal guardian today and she seemed to have somewhat of a limited insight into the situation although mentions that she would like to have the tumor removed We will go ahead and plan stage the patient with chest CT/and then have her return with removal of discussed risk of nephrectomy She has multiple medical comorbidities as well as cognitive impairment per her record, thus decision making whether or not to proceed with surgery will be relatively complex and surveillance may be favored - CT CHEST WO CONTRAST; Future - Comprehensive metabolic panel  2. Bilateral low back pain with sciatica, sciatica laterality unspecified, unspecified chronicity Based on the nature, location, radiation of her pain, this is unlikely related to the renal mass Suspect MSK etiology with sciatica as source of discomfort  Return for please return  after chest CT WITH LEGAL GUIARDIAN.  Hollice Espy, MD  [facility] [address], [place], [place] [address] [phone]

## 2017-09-18 LAB — COMPREHENSIVE METABOLIC PANEL
A/G RATIO: 1.9 (ref 1.2–2.2)
ALBUMIN: 4.4 g/dL (ref 3.5–4.7)
ALT: 10 IU/L (ref 0–32)
AST: 14 IU/L (ref 0–40)
Alkaline Phosphatase: 73 IU/L (ref 39–117)
BUN/Creatinine Ratio: 14 (ref 12–28)
BUN: 15 mg/dL (ref 8–27)
Bilirubin Total: 0.4 mg/dL (ref 0.0–1.2)
CALCIUM: 9.3 mg/dL (ref 8.7–10.3)
CO2: 24 mmol/L (ref 20–29)
Chloride: 100 mmol/L (ref 96–106)
Creatinine, Ser: 1.04 mg/dL — ABNORMAL HIGH (ref 0.57–1.00)
GFR, EST AFRICAN AMERICAN: 57 mL/min/{1.73_m2} — AB (ref >59)
GFR, EST NON AFRICAN AMERICAN: 50 mL/min/{1.73_m2} — AB (ref >59)
GLOBULIN, TOTAL: 2.3 g/dL (ref 1.5–4.5)
Glucose: 101 mg/dL — ABNORMAL HIGH (ref 65–99)
Potassium: 4 mmol/L (ref 3.5–5.2)
SODIUM: 139 mmol/L (ref 134–144)
TOTAL PROTEIN: 6.7 g/dL (ref 6.0–8.5)

## 2017-10-04 ENCOUNTER — Ambulatory Visit
Admission: RE | Admit: 2017-10-04 | Discharge: 2017-10-04 | Disposition: A | Discharge status: Home / Self Care | Payer: Medicare HMO | Source: Ambulatory Visit | Attending: Urology | Admitting: Urology

## 2017-10-04 DIAGNOSIS — I2510 Atherosclerotic heart disease of native coronary artery without angina pectoris: Secondary | ICD-10-CM | POA: Diagnosis not present

## 2017-10-04 DIAGNOSIS — N2889 Other specified disorders of kidney and ureter: Secondary | ICD-10-CM | POA: Insufficient documentation

## 2017-10-04 DIAGNOSIS — I700 Atherosclerosis of aorta: Secondary | ICD-10-CM | POA: Diagnosis not present

## 2017-10-04 DIAGNOSIS — R918 Other nonspecific abnormal finding of lung field: Secondary | ICD-10-CM | POA: Insufficient documentation

## 2017-10-04 DIAGNOSIS — N200 Calculus of kidney: Secondary | ICD-10-CM | POA: Diagnosis not present

## 2017-10-08 ENCOUNTER — Encounter: Payer: Self-pay | Admitting: Urology

## 2017-10-08 ENCOUNTER — Ambulatory Visit (INDEPENDENT_AMBULATORY_CARE_PROVIDER_SITE_OTHER): Payer: Medicare HMO | Admitting: Urology

## 2017-10-08 DIAGNOSIS — N2889 Other specified disorders of kidney and ureter: Secondary | ICD-10-CM

## 2017-10-08 NOTE — Progress Notes (Signed)
10/08/2017 1:59 PM   Karla Alexander [birth year] [id number]  Referring provider: Radene Gunning, MD No address on file  Chief Complaint  Patient presents with  . Renal Mass    follow up    HPI: 84 year old female with multiple medical comorbidities including a personal history of CAD, COPD, Alzheimer's dementia who returns the office today after further staging imaging as well as accompanied today by her healthcare power of attorney.  She initially presented with an avidly enhancing large right lower pole renal mass on CT scan from 08/13/2017.   CT scan showed a 5.7 x 4.6 x 5.1 cm avidly enhancing soft tissue mass with neovascularity involving the right lower pole of the kidney.  It appears to be encapsulated without evidence of renal vein invasion or lymphadenopathy.   This was an incidental finding on further work-up for low back pain.  When she initially presented to the clinic, she was unaccompanied brought by her facility.  Today, she is accompanied by her court appointed legal healthcare power of attorney, a nurse from her assisted living facility, and a remote relative with her daughter.  In the interim, she is undergone a CT of the chest without evidence of metastatic disease.  CMP normal, creatinine 1.04.  She continues to deny any flank pain or gross hematuria.    PMH: Past Medical History:  Diagnosis Date  . Arthritis   . Constipation   . Coronary artery disease    non-obstructive CAD 04/2013  . Dementia   . Depression   . GERD (gastroesophageal reflux disease)   . Hyperlipemia   . Hypertension    dr Delsa Sale   [place]  . Hypothyroidism   . Seasonal allergies   . Shortness of breath     Surgical History: Past Surgical History:  Procedure Laterality Date  . BACK SURGERY    . CARDIAC CATHETERIZATION  04/24/2013   20% mLAD, 20% OM1, 30% #1/60% #2 pRCA [facility]).  EF 68% by stress test 04/12/13.   Marland Kitchen MAXIMUM ACCESS (MAS)POSTERIOR LUMBAR INTERBODY FUSION  (PLIF) 2 LEVEL N/A 05/31/2013   Procedure: FOR MAXIMUM ACCESS (MAS) POSTERIOR LUMBAR INTERBODY FUSION (PLIF) Lumbar Four-Five, Lumbar Five-Sacral One;  Surgeon: Eustace Moore, MD;  Location: [place] [facility];  Service: Neurosurgery;  Laterality: N/A;  FOR MAXIMUM ACCESS (MAS) POSTERIOR LUMBAR INTERBODY FUSION (PLIF) Lumbar Four-Five, Lumbar Five-Sacral One  . REVERSE SHOULDER ARTHROPLASTY Left 08/23/2014   Procedure: REVERSE SHOULDER ARTHROPLASTY WITH APPLICATION OF CABLES ;  Surgeon: Tania Ade, MD;  Location: [place];  Service: Orthopedics;  Laterality: Left;  Left reverse total shoulder arthroplasty    Home Medications:  Allergies as of 10/08/2017   No Known Allergies     Medication List        Accurate as of 10/08/17 11:59 PM. Always use your most recent med list.          acetaminophen 650 MG CR tablet Commonly known as:  TYLENOL Take 650 mg by mouth every 4 (four) hours as needed for pain.   alendronate 70 MG tablet Commonly known as:  FOSAMAX Take 70 mg by mouth every Monday.   atorvastatin 20 MG tablet Commonly known as:  LIPITOR   Calcium Citrate-Vitamin D 250-200 MG-UNIT Tabs Take 1 tablet by mouth 2 (two) times daily.   diclofenac sodium 1 % Gel Commonly known as:  VOLTAREN   divalproex 125 MG capsule Commonly known as:  DEPAKOTE SPRINKLE   fluticasone 50 MCG/ACT nasal spray Commonly known as:  FLONASE  levothyroxine 100 MCG tablet Commonly known as:  SYNTHROID, LEVOTHROID   lovastatin 20 MG tablet Commonly known as:  MEVACOR Take 20 mg by mouth every evening.   oxyCODONE-acetaminophen 5-325 MG tablet Commonly known as:  ROXICET Take 1-2 tablets by mouth every 4 (four) hours as needed for severe pain.   polyethylene glycol packet Commonly known as:  MIRALAX / GLYCOLAX Take 17 g by mouth daily.   potassium chloride SA 20 MEQ tablet Commonly known as:  K-DUR,KLOR-CON   pramipexole 0.125 MG tablet Commonly known as:  MIRAPEX   QUEtiapine 25 MG  tablet Commonly known as:  SEROQUEL Take 0.5 tablets (12.5 mg total) by mouth at bedtime.   ranitidine 150 MG tablet Commonly known as:  ZANTAC Take 150 mg by mouth daily.   traMADol 50 MG tablet Commonly known as:  ULTRAM   triamterene-hydrochlorothiazide 37.5-25 MG capsule Commonly known as:  DYAZIDE Take 1 capsule by mouth daily.       Allergies: No Known Allergies  Family History: Family History  Problem Relation Age of Onset  . Hypertension Unknown     Social History:  reports that she has never smoked. She has never used smokeless tobacco. She reports that she does not drink alcohol or use drugs.  ROS: UROLOGY Frequent Urination?: No Hard to postpone urination?: No Burning/pain with urination?: No Get up at night to urinate?: No Leakage of urine?: No Urine stream starts and stops?: No Trouble starting stream?: No Do you have to strain to urinate?: No Blood in urine?: No Urinary tract infection?: No Sexually transmitted disease?: No Injury to kidneys or bladder?: No Painful intercourse?: No Weak stream?: No Currently pregnant?: No Vaginal bleeding?: No Last menstrual period?: n  Gastrointestinal Nausea?: No Vomiting?: No Indigestion/heartburn?: No Diarrhea?: No Constipation?: No  Constitutional Fever: No Night sweats?: No Weight loss?: No Fatigue?: No  Skin Skin rash/lesions?: No Itching?: No  Eyes Blurred vision?: No Double vision?: No  Ears/Nose/Throat Sore throat?: No Sinus problems?: No  Hematologic/Lymphatic Swollen glands?: No Easy bruising?: No  Cardiovascular Leg swelling?: No Chest pain?: No  Respiratory Cough?: No Shortness of breath?: No  Endocrine Excessive thirst?: No  Musculoskeletal Back pain?: Yes Joint pain?: No  Neurological Headaches?: No Dizziness?: No  Psychologic Depression?: No Anxiety?: No  Physical Exam: BP (!) 159/74   Pulse 78   Wt 158 lb (71.7 kg)   BMI 34.80 kg/m    Constitutional:  Alert and oriented, No acute distress.  Elderly, alert and oriented.  Answers most questions appropriately. HEENT: [place] AT, moist mucus membranes.  Trachea midline, no masses. Cardiovascular: No clubbing, cyanosis, or edema. Respiratory: Normal respiratory effort, no increased work of breathing. Skin: No rashes, bruises or suspicious lesions. Neurologic: Grossly intact, no focal deficits, moving all 4 extremities. Psychiatric: Normal mood and affect.  Laboratory Data: Lab Results  Component Value Date   WBC 8.5 10/11/2015   HGB 12.7 10/11/2015   HCT 36.6 10/11/2015   MCV 92.4 10/11/2015   PLT 143 (L) 10/11/2015    Lab Results  Component Value Date   CREATININE 1.04 (H) 09/17/2017    Urinalysis N/A  Pertinent Imaging: CT scan of the chest on 10/04/2017 was personally reviewed today.  In addition to this, images from CT abdomen 08/13/2017 were reviewed again personally today and shared with both the patient and her accompanying guests.  Assessment & Plan:    1. Right renal mass Lengthy discussion today with the patient, her healthcare power of attorney and remote  family members Explained that the renal mass most certainly is renal cell carcinoma especially given its enhancement and hypervascularity Although at this size, there is risk for metastatic disease, this point in time there does not appear to be any locally invasive or distant metastatic disease We discussed that given the size of the lesion, radical nephrectomy would be her primary surgical option Given her age and medical comorbidities, alternatives including surveillance were also discussed There is discussion about the risks and benefits were lengthy as well as the intraoperative and postoperative courses as well as risk of surgery. Given her age, she is high risk for postoperative complications and prolonged recovery After discussing this further with both the patient and also the conversation  partially facilitated by the nurse from her facility, the patient is extremely anxious about having to relocate from her current assisted living status back to a nursing home status as she was previously, would like to avoid this at all costs We discussed goals of care particularly quality of life versus longevity and she seems to want to focus primarily on quality At this time she remains asymptomatic At this point, both the patient as well as all accompanying visitors today are agreeable that we will plan for repeat CT scan in 3 to 4 months to assess the growth rate, and if the lesion appears to be aggressive, will reconsider, otherwise will pursue surveillance - CT RENAL ABD W/WO; Future  Return in about 4 months (around 02/08/2018) for CT scan.  Hollice Espy, MD  [facility] [address], [place], [place] [address] [phone]  I spent 25 min with this patient of which greater than 50% was spent in counseling and coordination of care with the patient.

## 2017-10-26 DIAGNOSIS — F4310 Post-traumatic stress disorder, unspecified: Secondary | ICD-10-CM | POA: Diagnosis not present

## 2017-10-26 DIAGNOSIS — F331 Major depressive disorder, recurrent, moderate: Secondary | ICD-10-CM | POA: Diagnosis not present

## 2017-11-01 DIAGNOSIS — F4310 Post-traumatic stress disorder, unspecified: Secondary | ICD-10-CM | POA: Diagnosis not present

## 2017-11-01 DIAGNOSIS — F331 Major depressive disorder, recurrent, moderate: Secondary | ICD-10-CM | POA: Diagnosis not present

## 2017-11-01 DIAGNOSIS — L538 Other specified erythematous conditions: Secondary | ICD-10-CM | POA: Diagnosis not present

## 2017-11-01 DIAGNOSIS — B354 Tinea corporis: Secondary | ICD-10-CM | POA: Diagnosis not present

## 2017-11-09 DIAGNOSIS — F4310 Post-traumatic stress disorder, unspecified: Secondary | ICD-10-CM | POA: Diagnosis not present

## 2017-11-09 DIAGNOSIS — F331 Major depressive disorder, recurrent, moderate: Secondary | ICD-10-CM | POA: Diagnosis not present

## 2017-11-15 DIAGNOSIS — F4310 Post-traumatic stress disorder, unspecified: Secondary | ICD-10-CM | POA: Diagnosis not present

## 2017-11-15 DIAGNOSIS — F331 Major depressive disorder, recurrent, moderate: Secondary | ICD-10-CM | POA: Diagnosis not present

## 2017-11-29 DIAGNOSIS — F331 Major depressive disorder, recurrent, moderate: Secondary | ICD-10-CM | POA: Diagnosis not present

## 2017-11-29 DIAGNOSIS — F4310 Post-traumatic stress disorder, unspecified: Secondary | ICD-10-CM | POA: Diagnosis not present

## 2017-12-01 DIAGNOSIS — E785 Hyperlipidemia, unspecified: Secondary | ICD-10-CM | POA: Diagnosis not present

## 2017-12-01 DIAGNOSIS — I129 Hypertensive chronic kidney disease with stage 1 through stage 4 chronic kidney disease, or unspecified chronic kidney disease: Secondary | ICD-10-CM | POA: Diagnosis not present

## 2017-12-01 DIAGNOSIS — I10 Essential (primary) hypertension: Secondary | ICD-10-CM | POA: Diagnosis not present

## 2017-12-01 DIAGNOSIS — D519 Vitamin B12 deficiency anemia, unspecified: Secondary | ICD-10-CM | POA: Diagnosis not present

## 2017-12-01 DIAGNOSIS — D649 Anemia, unspecified: Secondary | ICD-10-CM | POA: Diagnosis not present

## 2017-12-01 DIAGNOSIS — Z79899 Other long term (current) drug therapy: Secondary | ICD-10-CM | POA: Diagnosis not present

## 2017-12-01 DIAGNOSIS — I130 Hypertensive heart and chronic kidney disease with heart failure and stage 1 through stage 4 chronic kidney disease, or unspecified chronic kidney disease: Secondary | ICD-10-CM | POA: Diagnosis not present

## 2017-12-06 DIAGNOSIS — F331 Major depressive disorder, recurrent, moderate: Secondary | ICD-10-CM | POA: Diagnosis not present

## 2017-12-06 DIAGNOSIS — F4310 Post-traumatic stress disorder, unspecified: Secondary | ICD-10-CM | POA: Diagnosis not present

## 2017-12-07 DIAGNOSIS — I2510 Atherosclerotic heart disease of native coronary artery without angina pectoris: Secondary | ICD-10-CM | POA: Diagnosis not present

## 2017-12-07 DIAGNOSIS — F0280 Dementia in other diseases classified elsewhere without behavioral disturbance: Secondary | ICD-10-CM | POA: Diagnosis not present

## 2017-12-07 DIAGNOSIS — E039 Hypothyroidism, unspecified: Secondary | ICD-10-CM | POA: Diagnosis not present

## 2017-12-07 DIAGNOSIS — F3340 Major depressive disorder, recurrent, in remission, unspecified: Secondary | ICD-10-CM | POA: Diagnosis not present

## 2017-12-07 DIAGNOSIS — G301 Alzheimer's disease with late onset: Secondary | ICD-10-CM | POA: Diagnosis not present

## 2017-12-07 DIAGNOSIS — Z593 Problems related to living in residential institution: Secondary | ICD-10-CM | POA: Insufficient documentation

## 2017-12-07 DIAGNOSIS — Z9861 Coronary angioplasty status: Secondary | ICD-10-CM | POA: Diagnosis not present

## 2017-12-07 DIAGNOSIS — J449 Chronic obstructive pulmonary disease, unspecified: Secondary | ICD-10-CM | POA: Diagnosis not present

## 2017-12-07 DIAGNOSIS — I779 Disorder of arteries and arterioles, unspecified: Secondary | ICD-10-CM | POA: Diagnosis not present

## 2017-12-13 DIAGNOSIS — F4310 Post-traumatic stress disorder, unspecified: Secondary | ICD-10-CM | POA: Diagnosis not present

## 2017-12-13 DIAGNOSIS — F331 Major depressive disorder, recurrent, moderate: Secondary | ICD-10-CM | POA: Diagnosis not present

## 2017-12-20 DIAGNOSIS — F331 Major depressive disorder, recurrent, moderate: Secondary | ICD-10-CM | POA: Diagnosis not present

## 2017-12-20 DIAGNOSIS — F4310 Post-traumatic stress disorder, unspecified: Secondary | ICD-10-CM | POA: Diagnosis not present

## 2018-01-06 DIAGNOSIS — F4310 Post-traumatic stress disorder, unspecified: Secondary | ICD-10-CM | POA: Diagnosis not present

## 2018-01-06 DIAGNOSIS — F331 Major depressive disorder, recurrent, moderate: Secondary | ICD-10-CM | POA: Diagnosis not present

## 2018-01-10 DIAGNOSIS — M25552 Pain in left hip: Secondary | ICD-10-CM | POA: Diagnosis not present

## 2018-01-10 DIAGNOSIS — M5430 Sciatica, unspecified side: Secondary | ICD-10-CM | POA: Diagnosis not present

## 2018-01-13 DIAGNOSIS — F331 Major depressive disorder, recurrent, moderate: Secondary | ICD-10-CM | POA: Diagnosis not present

## 2018-01-13 DIAGNOSIS — F4310 Post-traumatic stress disorder, unspecified: Secondary | ICD-10-CM | POA: Diagnosis not present

## 2018-01-20 DIAGNOSIS — F331 Major depressive disorder, recurrent, moderate: Secondary | ICD-10-CM | POA: Diagnosis not present

## 2018-01-20 DIAGNOSIS — F4310 Post-traumatic stress disorder, unspecified: Secondary | ICD-10-CM | POA: Diagnosis not present

## 2018-01-27 DIAGNOSIS — F4310 Post-traumatic stress disorder, unspecified: Secondary | ICD-10-CM | POA: Diagnosis not present

## 2018-01-27 DIAGNOSIS — F331 Major depressive disorder, recurrent, moderate: Secondary | ICD-10-CM | POA: Diagnosis not present

## 2018-02-04 ENCOUNTER — Ambulatory Visit
Admission: RE | Admit: 2018-02-04 | Discharge: 2018-02-04 | Disposition: A | Discharge status: Home / Self Care | Payer: Medicare HMO | Source: Ambulatory Visit | Attending: Urology | Admitting: Urology

## 2018-02-04 DIAGNOSIS — N2889 Other specified disorders of kidney and ureter: Secondary | ICD-10-CM | POA: Diagnosis not present

## 2018-02-04 LAB — POCT I-STAT CREATININE: CREATININE: 1.1 mg/dL — AB (ref 0.44–1.00)

## 2018-02-04 MED ORDER — IOPAMIDOL (ISOVUE-300) INJECTION 61%
100.0000 mL | Freq: Once | INTRAVENOUS | Status: AC | PRN
  Administered 2018-02-04: 100 mL via INTRAVENOUS

## 2018-02-07 DIAGNOSIS — F4310 Post-traumatic stress disorder, unspecified: Secondary | ICD-10-CM | POA: Diagnosis not present

## 2018-02-07 DIAGNOSIS — F331 Major depressive disorder, recurrent, moderate: Secondary | ICD-10-CM | POA: Diagnosis not present

## 2018-02-08 ENCOUNTER — Ambulatory Visit (INDEPENDENT_AMBULATORY_CARE_PROVIDER_SITE_OTHER): Payer: Medicare HMO | Admitting: Urology

## 2018-02-08 ENCOUNTER — Encounter: Payer: Self-pay | Admitting: Urology

## 2018-02-08 ENCOUNTER — Other Ambulatory Visit: Payer: Self-pay

## 2018-02-08 DIAGNOSIS — N2889 Other specified disorders of kidney and ureter: Secondary | ICD-10-CM | POA: Diagnosis not present

## 2018-02-08 NOTE — Progress Notes (Signed)
02/08/2018 6:59 PM   Karla Alexander 09/18/1932 [id number]  Referring provider: Radene Gunning, MD No address on file  Chief Complaint  Patient presents with  . Renal mass    HPI: 84 year old female with multiple medical comorbidities including a history of CAD, COPD, and Alzheimer's dementia who returns today for follow-up of an incidental right lower pole renal mass with serial imaging.  In interim, she underwent CT abdomen with and without contrast on 02/04/2018 which show an enlarging right lower pole renal mass now measuring 6.2 x 4.9 x 5.3 cm.  There is no associated lymphadenopathy.  She also had a CT of the chest for shortness of breath on 09/2017 which showed some tiny right mid and lower lobe nodules which are stable since 2013 favoring benign etiology.  She is accompanied again today by multiple family members as well as her legal guardian.  Previous history: She initially presented with an avidly enhancing large right lower pole renal mass on CT scan from 08/13/2017.  CT scan showed a 5.7 x 4.6 x 5.1 cm avidly enhancing soft tissue mass with neovascularity involving the right lower pole of the kidney. It appears to be encapsulated without evidence of renal vein invasion or lymphadenopathy.   No flank pain other than chronic midline low back pain.  No gross hematuria.  No weight loss.  This was an incidental finding on further work-up for low back pain.  When she initially presented to the clinic, she was unaccompanied brought by her facility.  Today, she is accompanied by her court appointed legal healthcare power of attorney, a nurse from her assisted living facility, and a remote relative with her daughter.  In the interim, she is undergone a CT of the chest without evidence of metastatic disease.  CMP normal, creatinine 1.04.  She continues to deny any flank pain or gross hematuria  PMH: Past Medical History:  Diagnosis Date  . Arthritis   .  Constipation   . Coronary artery disease    non-obstructive CAD 04/2013  . Dementia ([place])   . Depression   . GERD (gastroesophageal reflux disease)   . Hyperlipemia   . Hypertension    dr Delsa Sale   [place]  . Hypothyroidism   . Seasonal allergies   . Shortness of breath     Surgical History: Past Surgical History:  Procedure Laterality Date  . BACK SURGERY    . CARDIAC CATHETERIZATION  04/24/2013   20% mLAD, 20% OM1, 30% #1/60% #2 pRCA [facility]).  EF 68% by stress test 04/12/13.   Marland Kitchen MAXIMUM ACCESS (MAS)POSTERIOR LUMBAR INTERBODY FUSION (PLIF) 2 LEVEL N/A 05/31/2013   Procedure: FOR MAXIMUM ACCESS (MAS) POSTERIOR LUMBAR INTERBODY FUSION (PLIF) Lumbar Four-Five, Lumbar Five-Sacral One;  Surgeon: Eustace Moore, MD;  Location: [facility];  Service: Neurosurgery;  Laterality: N/A;  FOR MAXIMUM ACCESS (MAS) POSTERIOR LUMBAR INTERBODY FUSION (PLIF) Lumbar Four-Five, Lumbar Five-Sacral One  . REVERSE SHOULDER ARTHROPLASTY Left 08/23/2014   Procedure: REVERSE SHOULDER ARTHROPLASTY WITH APPLICATION OF CABLES ;  Surgeon: Tania Ade, MD;  Location: [facility];  Service: Orthopedics;  Laterality: Left;  Left reverse total shoulder arthroplasty    Home Medications:  Allergies as of 02/08/2018   No Known Allergies     Medication List        Accurate as of 02/08/18 11:59 PM. Always use your most recent med list.          acetaminophen 650 MG CR tablet Commonly known as:  TYLENOL Take  650 mg by mouth every 4 (four) hours as needed for pain.   alendronate 70 MG tablet Commonly known as:  FOSAMAX Take 70 mg by mouth every Monday.   atorvastatin 20 MG tablet Commonly known as:  LIPITOR   Calcium Citrate-Vitamin D 250-200 MG-UNIT Tabs Take 1 tablet by mouth 2 (two) times daily.   diclofenac sodium 1 % Gel Commonly known as:  VOLTAREN   divalproex 125 MG capsule Commonly known as:  DEPAKOTE SPRINKLE   fluticasone 50 MCG/ACT nasal spray Commonly known as:  FLONASE   levothyroxine  100 MCG tablet Commonly known as:  SYNTHROID, LEVOTHROID   lovastatin 20 MG tablet Commonly known as:  MEVACOR Take 20 mg by mouth every evening.   oxyCODONE-acetaminophen 5-325 MG tablet Commonly known as:  PERCOCET/ROXICET Take 1-2 tablets by mouth every 4 (four) hours as needed for severe pain.   polyethylene glycol packet Commonly known as:  MIRALAX / GLYCOLAX Take 17 g by mouth daily.   potassium chloride SA 20 MEQ tablet Commonly known as:  K-DUR,KLOR-CON   pramipexole 0.125 MG tablet Commonly known as:  MIRAPEX   QUEtiapine 25 MG tablet Commonly known as:  SEROQUEL Take 0.5 tablets (12.5 mg total) by mouth at bedtime.   ranitidine 150 MG tablet Commonly known as:  ZANTAC Take 150 mg by mouth daily.   traMADol 50 MG tablet Commonly known as:  ULTRAM   triamterene-hydrochlorothiazide 37.5-25 MG capsule Commonly known as:  DYAZIDE Take 1 capsule by mouth daily.       Allergies: No Known Allergies  Family History: Family History  Problem Relation Age of Onset  . Hypertension Unknown     Social History:  reports that she has never smoked. She has never used smokeless tobacco. She reports that she does not drink alcohol or use drugs.  ROS: UROLOGY Frequent Urination?: Yes Hard to postpone urination?: No Burning/pain with urination?: No Get up at night to urinate?: Yes Leakage of urine?: Yes Urine stream starts and stops?: No Trouble starting stream?: Yes Do you have to strain to urinate?: No Blood in urine?: No Urinary tract infection?: No Sexually transmitted disease?: No Injury to kidneys or bladder?: No Painful intercourse?: No Weak stream?: No Currently pregnant?: No Vaginal bleeding?: No Last menstrual period?: n  Gastrointestinal Nausea?: No Vomiting?: No Indigestion/heartburn?: Yes Diarrhea?: No Constipation?: No  Constitutional Fever: No Night sweats?: No Weight loss?: No Fatigue?: No  Skin Skin rash/lesions?: No Itching?:  No  Eyes Blurred vision?: No Double vision?: No  Ears/Nose/Throat Sore throat?: No Sinus problems?: No  Hematologic/Lymphatic Swollen glands?: No Easy bruising?: No  Cardiovascular Leg swelling?: Yes Chest pain?: No  Respiratory Cough?: No Shortness of breath?: No     Musculoskeletal Back pain?: Yes Joint pain?: Yes  Neurological Headaches?: No Dizziness?: No  Psychologic Depression?: Yes Anxiety?: Yes  Physical Exam: BP (!) 173/82   Pulse 82   Wt 161 lb (73 kg)   BMI 35.46 kg/m   Constitutional:  Alert and oriented, No acute distress. HEENT: [place] AT, moist mucus membranes.  Trachea midline, no masses. Cardiovascular: No clubbing, cyanosis, or edema. Respiratory: Normal respiratory effort, no increased work of breathing. GI: Obese Skin: No rashes, bruises or suspicious lesions. Neurologic: Grossly intact, no focal deficits, moving all 4 extremities. Psychiatric: Normal mood and affect.  Laboratory Data: Lab Results  Component Value Date   WBC 8.5 10/11/2015   HGB 12.7 10/11/2015   HCT 36.6 10/11/2015   MCV 92.4 10/11/2015   PLT  143 (L) 10/11/2015    Lab Results  Component Value Date   CREATININE 1.10 (H) 02/04/2018    Pertinent Imaging: CLINICAL DATA:  Follow-up right lower pole renal mass  EXAM: CT ABDOMEN WITHOUT AND WITH CONTRAST  TECHNIQUE: Multidetector CT imaging of the abdomen was performed following the standard protocol before and following the bolus administration of intravenous contrast.  CONTRAST:  190mL ISOVUE-300 IOPAMIDOL (ISOVUE-300) INJECTION 61%  COMPARISON:  08/13/2017  FINDINGS: Lower chest: 3 mm nodules in the right middle lobe and left lower lobe (series 7/image 1), unchanged from 2013, benign.  Hepatobiliary: Liver is within normal limits.  Gallbladder is unremarkable. No intrahepatic or extrahepatic ductal dilatation.  Pancreas: Within normal limits.  Spleen: Within normal  limits.  Adrenals/Urinary Tract: Adrenal glands are within normal limits.  6.2 x 4.9 x 5.3 cm enhancing right lower pole renal mass, compatible with renal cell carcinoma.  2 mm nonobstructing interpolar right renal calculus (series 2/image 29). Left kidney is within normal limits. No hydronephrosis.  Stomach/Bowel: Stomach is notable for a small hiatal hernia.  Visualized bowel is unremarkable.  Vascular/Lymphatic: No evidence of abdominal aortic aneurysm.  Atherosclerotic calcifications of the abdominal aorta and branch vessels.  Two right renal arteries. Accessory right renal vein has been recruited to supply the right lower pole/mass. No definite renal vein invasion.  Small upper abdominal lymph nodes, including an 11 mm short axis gastrohepatic node (series 2/image 33), new.  Other: No abdominal ascites.  Musculoskeletal: Mild degenerative changes of the lower thoracic spine. Lumbosacral fixation hardware, incompletely visualized.  IMPRESSION: 6.2 cm enhancing right lower pole renal mass, compatible with renal cell carcinoma, previously 5.7 cm.  Two right renal arteries. Recruited accessory right lower pole renal vein. No definite renal vein invasion.  New 11 mm short axis gastrohepatic node, nodal metastasis not excluded.   Electronically Signed   By: Julian Hy M.D.   On: 02/04/2018 14:10  CT scan was personally reviewed today as well as compared to previous CT scan.  I also reviewed her chest CT from 09/2017 personally.  Agree with radiologic interpretation.   Assessment & Plan:    1. Renal mass Enlarging right renal mass, most consistent with renal cell carcinoma.  There is been interval 5 mm growth over the interval 6 months with borderline 11 mm gastrohepatic node.  Unclear whether this represents metastatic disease or possibly reactive.  Findings were discussed with the family members as well as healthcare power of attorney and  the patient herself today in detail.  They are made aware of the interval growth and concern for a more aggressive type tumor.  We again reviewed options including pursuing nephrectomy versus continued surveillance with treatment of metastatic disease with palliative intent.  Risk and benefits of each were discussed.  At this point in time, the patient's primary concern is her independence and willingness to avoid higher level of care.  We discussed that given all of her medical comorbidities, she does have significant risk of morbidity and likely need for rehab, possibility of further cognitive decline amongst other more traditional risks of surgery.  She is a relatively poor surgical candidate.  After lengthy discussion today, they would like to continue surveillance with treatment as needed if she develops metastatic disease.  Current growth rate approximately 5 mm every 6 months or 1 cm annually.  We will plan to reimage her in 6 months with chest x-ray.  She is agreeable this plan.  She will return if she her  family members changed her mind and would like to pursue surgical intervention.  They are again today offered a second opinion which they declined.   - Chest 1 View; Future - CT Abd Wo & W Cm; Future   Return in about 6 months (around 08/09/2018) for Ct abd / CXR.  Hollice Espy, MD  [facility] [address], [place], [place] [address] [phone]   I spent 25 min with this patient of which greater than 50% was spent in counseling and coordination of care with the patient.

## 2018-02-24 DIAGNOSIS — F331 Major depressive disorder, recurrent, moderate: Secondary | ICD-10-CM | POA: Diagnosis not present

## 2018-02-24 DIAGNOSIS — F4310 Post-traumatic stress disorder, unspecified: Secondary | ICD-10-CM | POA: Diagnosis not present

## 2018-03-01 DIAGNOSIS — Z79899 Other long term (current) drug therapy: Secondary | ICD-10-CM | POA: Diagnosis not present

## 2018-03-01 DIAGNOSIS — D519 Vitamin B12 deficiency anemia, unspecified: Secondary | ICD-10-CM | POA: Diagnosis not present

## 2018-03-01 DIAGNOSIS — R569 Unspecified convulsions: Secondary | ICD-10-CM | POA: Diagnosis not present

## 2018-03-01 DIAGNOSIS — D649 Anemia, unspecified: Secondary | ICD-10-CM | POA: Diagnosis not present

## 2018-03-01 DIAGNOSIS — E785 Hyperlipidemia, unspecified: Secondary | ICD-10-CM | POA: Diagnosis not present

## 2018-03-03 DIAGNOSIS — F4310 Post-traumatic stress disorder, unspecified: Secondary | ICD-10-CM | POA: Diagnosis not present

## 2018-03-03 DIAGNOSIS — F331 Major depressive disorder, recurrent, moderate: Secondary | ICD-10-CM | POA: Diagnosis not present

## 2018-03-08 DIAGNOSIS — F331 Major depressive disorder, recurrent, moderate: Secondary | ICD-10-CM | POA: Diagnosis not present

## 2018-03-08 DIAGNOSIS — F4310 Post-traumatic stress disorder, unspecified: Secondary | ICD-10-CM | POA: Diagnosis not present

## 2018-08-08 ENCOUNTER — Telehealth: Payer: Self-pay | Admitting: Urology

## 2018-08-08 NOTE — Telephone Encounter (Signed)
Patient's social worker Rolm Bookbinder called and stated that the patient has moved to a new facility 1 1/2 away and wanted to know if she would be able to get her CT scan done else where? The problem with that is it has already been approved here at [facility] and we are unable to see images from another facility. If at all possible we would like for her to have her scan done here at [facility] prior to her follow up appt. Her app is 08-24-18 and her PA expires on 10-10-18. I have been unable to reach the social worker to discuss this with her. I have left several messages with her.   Sharyn Lull

## 2018-08-09 ENCOUNTER — Ambulatory Visit: Payer: Medicare HMO | Admitting: Urology

## 2018-08-24 ENCOUNTER — Ambulatory Visit: Payer: Medicare HMO | Admitting: Urology

## 2018-08-30 DIAGNOSIS — Z1159 Encounter for screening for other viral diseases: Secondary | ICD-10-CM | POA: Diagnosis not present

## 2018-08-30 DIAGNOSIS — M7989 Other specified soft tissue disorders: Secondary | ICD-10-CM | POA: Diagnosis not present

## 2018-08-30 DIAGNOSIS — N183 Chronic kidney disease, stage 3 (moderate): Secondary | ICD-10-CM | POA: Diagnosis not present

## 2018-08-30 DIAGNOSIS — I129 Hypertensive chronic kidney disease with stage 1 through stage 4 chronic kidney disease, or unspecified chronic kidney disease: Secondary | ICD-10-CM | POA: Diagnosis not present

## 2018-09-15 DIAGNOSIS — I129 Hypertensive chronic kidney disease with stage 1 through stage 4 chronic kidney disease, or unspecified chronic kidney disease: Secondary | ICD-10-CM | POA: Diagnosis not present

## 2018-09-15 DIAGNOSIS — R2243 Localized swelling, mass and lump, lower limb, bilateral: Secondary | ICD-10-CM | POA: Diagnosis not present

## 2018-09-15 DIAGNOSIS — N183 Chronic kidney disease, stage 3 (moderate): Secondary | ICD-10-CM | POA: Diagnosis not present

## 2018-09-15 DIAGNOSIS — R0601 Orthopnea: Secondary | ICD-10-CM | POA: Diagnosis not present

## 2018-09-19 DIAGNOSIS — R600 Localized edema: Secondary | ICD-10-CM | POA: Diagnosis not present

## 2018-09-19 DIAGNOSIS — R0601 Orthopnea: Secondary | ICD-10-CM | POA: Diagnosis not present

## 2018-09-28 DIAGNOSIS — K5901 Slow transit constipation: Secondary | ICD-10-CM | POA: Diagnosis not present

## 2018-09-28 DIAGNOSIS — R0603 Acute respiratory distress: Secondary | ICD-10-CM | POA: Diagnosis not present

## 2018-09-28 DIAGNOSIS — R2243 Localized swelling, mass and lump, lower limb, bilateral: Secondary | ICD-10-CM | POA: Diagnosis not present

## 2018-09-28 DIAGNOSIS — R05 Cough: Secondary | ICD-10-CM | POA: Diagnosis not present

## 2018-10-04 ENCOUNTER — Ambulatory Visit: Payer: Medicare HMO | Admitting: Urology

## 2018-10-24 DIAGNOSIS — R600 Localized edema: Secondary | ICD-10-CM | POA: Diagnosis not present

## 2018-10-24 DIAGNOSIS — I129 Hypertensive chronic kidney disease with stage 1 through stage 4 chronic kidney disease, or unspecified chronic kidney disease: Secondary | ICD-10-CM | POA: Diagnosis not present

## 2018-10-24 DIAGNOSIS — N183 Chronic kidney disease, stage 3 (moderate): Secondary | ICD-10-CM | POA: Diagnosis not present

## 2018-11-15 DIAGNOSIS — M25562 Pain in left knee: Secondary | ICD-10-CM | POA: Diagnosis not present

## 2018-11-15 DIAGNOSIS — M25521 Pain in right elbow: Secondary | ICD-10-CM | POA: Diagnosis not present

## 2018-11-15 DIAGNOSIS — R410 Disorientation, unspecified: Secondary | ICD-10-CM | POA: Diagnosis not present

## 2018-11-15 DIAGNOSIS — R350 Frequency of micturition: Secondary | ICD-10-CM | POA: Diagnosis not present

## 2018-11-18 DIAGNOSIS — S81852D Open bite, left lower leg, subsequent encounter: Secondary | ICD-10-CM | POA: Diagnosis not present

## 2018-11-18 DIAGNOSIS — Z23 Encounter for immunization: Secondary | ICD-10-CM | POA: Diagnosis not present

## 2018-11-28 DIAGNOSIS — S81852A Open bite, left lower leg, initial encounter: Secondary | ICD-10-CM | POA: Diagnosis not present

## 2018-11-28 DIAGNOSIS — R112 Nausea with vomiting, unspecified: Secondary | ICD-10-CM | POA: Diagnosis not present

## 2018-11-28 DIAGNOSIS — Z1159 Encounter for screening for other viral diseases: Secondary | ICD-10-CM | POA: Diagnosis not present

## 2018-11-30 DIAGNOSIS — S81851D Open bite, right lower leg, subsequent encounter: Secondary | ICD-10-CM | POA: Diagnosis not present

## 2018-12-02 DIAGNOSIS — S81852D Open bite, left lower leg, subsequent encounter: Secondary | ICD-10-CM | POA: Diagnosis not present

## 2018-12-16 DIAGNOSIS — I2510 Atherosclerotic heart disease of native coronary artery without angina pectoris: Secondary | ICD-10-CM | POA: Diagnosis not present

## 2018-12-16 DIAGNOSIS — G3184 Mild cognitive impairment, so stated: Secondary | ICD-10-CM | POA: Diagnosis not present

## 2018-12-16 DIAGNOSIS — N183 Chronic kidney disease, stage 3 (moderate): Secondary | ICD-10-CM | POA: Diagnosis not present

## 2018-12-29 DIAGNOSIS — F0390 Unspecified dementia without behavioral disturbance: Secondary | ICD-10-CM | POA: Diagnosis not present

## 2019-01-12 DIAGNOSIS — E039 Hypothyroidism, unspecified: Secondary | ICD-10-CM | POA: Diagnosis not present

## 2019-01-12 DIAGNOSIS — I129 Hypertensive chronic kidney disease with stage 1 through stage 4 chronic kidney disease, or unspecified chronic kidney disease: Secondary | ICD-10-CM | POA: Diagnosis not present

## 2019-01-12 DIAGNOSIS — D649 Anemia, unspecified: Secondary | ICD-10-CM | POA: Diagnosis not present

## 2019-01-23 DIAGNOSIS — U071 COVID-19: Secondary | ICD-10-CM | POA: Diagnosis not present

## 2019-01-30 DIAGNOSIS — U071 COVID-19: Secondary | ICD-10-CM | POA: Diagnosis not present

## 2019-02-06 DIAGNOSIS — U071 COVID-19: Secondary | ICD-10-CM | POA: Diagnosis not present

## 2019-02-13 DIAGNOSIS — U071 COVID-19: Secondary | ICD-10-CM | POA: Diagnosis not present

## 2019-02-20 DIAGNOSIS — U071 COVID-19: Secondary | ICD-10-CM | POA: Diagnosis not present

## 2019-02-23 DIAGNOSIS — E039 Hypothyroidism, unspecified: Secondary | ICD-10-CM | POA: Diagnosis not present

## 2019-03-06 DIAGNOSIS — U071 COVID-19: Secondary | ICD-10-CM | POA: Diagnosis not present

## 2019-03-14 DIAGNOSIS — U071 COVID-19: Secondary | ICD-10-CM | POA: Diagnosis not present

## 2019-03-27 DIAGNOSIS — U071 COVID-19: Secondary | ICD-10-CM | POA: Diagnosis not present

## 2019-04-03 DIAGNOSIS — U071 COVID-19: Secondary | ICD-10-CM | POA: Diagnosis not present

## 2019-04-04 DIAGNOSIS — I739 Peripheral vascular disease, unspecified: Secondary | ICD-10-CM | POA: Diagnosis not present

## 2019-04-04 DIAGNOSIS — G301 Alzheimer's disease with late onset: Secondary | ICD-10-CM | POA: Diagnosis not present

## 2019-04-04 DIAGNOSIS — F0390 Unspecified dementia without behavioral disturbance: Secondary | ICD-10-CM | POA: Diagnosis not present

## 2019-04-04 DIAGNOSIS — K219 Gastro-esophageal reflux disease without esophagitis: Secondary | ICD-10-CM | POA: Diagnosis not present

## 2019-04-04 DIAGNOSIS — M546 Pain in thoracic spine: Secondary | ICD-10-CM | POA: Diagnosis not present

## 2019-04-10 DIAGNOSIS — E039 Hypothyroidism, unspecified: Secondary | ICD-10-CM | POA: Diagnosis not present

## 2019-04-11 DIAGNOSIS — U071 COVID-19: Secondary | ICD-10-CM | POA: Diagnosis not present

## 2019-04-17 DIAGNOSIS — U071 COVID-19: Secondary | ICD-10-CM | POA: Diagnosis not present

## 2019-04-28 DIAGNOSIS — U071 COVID-19: Secondary | ICD-10-CM | POA: Diagnosis not present

## 2019-05-01 DIAGNOSIS — M546 Pain in thoracic spine: Secondary | ICD-10-CM | POA: Diagnosis not present

## 2019-05-01 DIAGNOSIS — D49512 Neoplasm of unspecified behavior of left kidney: Secondary | ICD-10-CM | POA: Diagnosis not present

## 2019-05-03 DIAGNOSIS — U071 COVID-19: Secondary | ICD-10-CM | POA: Diagnosis not present

## 2019-05-09 DIAGNOSIS — U071 COVID-19: Secondary | ICD-10-CM | POA: Diagnosis not present

## 2019-05-10 DIAGNOSIS — Z23 Encounter for immunization: Secondary | ICD-10-CM | POA: Diagnosis not present

## 2019-05-15 DIAGNOSIS — U071 COVID-19: Secondary | ICD-10-CM | POA: Diagnosis not present

## 2019-05-21 DIAGNOSIS — U071 COVID-19: Secondary | ICD-10-CM | POA: Diagnosis not present

## 2019-05-25 DIAGNOSIS — M5432 Sciatica, left side: Secondary | ICD-10-CM | POA: Diagnosis not present

## 2019-05-25 DIAGNOSIS — G301 Alzheimer's disease with late onset: Secondary | ICD-10-CM | POA: Diagnosis not present

## 2019-05-25 DIAGNOSIS — F331 Major depressive disorder, recurrent, moderate: Secondary | ICD-10-CM | POA: Diagnosis not present

## 2019-05-25 DIAGNOSIS — M546 Pain in thoracic spine: Secondary | ICD-10-CM | POA: Diagnosis not present

## 2019-05-30 DIAGNOSIS — U071 COVID-19: Secondary | ICD-10-CM | POA: Diagnosis not present

## 2019-06-06 DIAGNOSIS — U071 COVID-19: Secondary | ICD-10-CM | POA: Diagnosis not present

## 2019-06-12 DIAGNOSIS — U071 COVID-19: Secondary | ICD-10-CM | POA: Diagnosis not present

## 2019-06-12 DIAGNOSIS — N183 Chronic kidney disease, stage 3 unspecified: Secondary | ICD-10-CM | POA: Diagnosis not present

## 2019-06-12 DIAGNOSIS — D49512 Neoplasm of unspecified behavior of left kidney: Secondary | ICD-10-CM | POA: Diagnosis not present

## 2019-06-12 DIAGNOSIS — E039 Hypothyroidism, unspecified: Secondary | ICD-10-CM | POA: Diagnosis not present

## 2019-06-18 DIAGNOSIS — U071 COVID-19: Secondary | ICD-10-CM | POA: Diagnosis not present

## 2019-06-25 DIAGNOSIS — U071 COVID-19: Secondary | ICD-10-CM | POA: Diagnosis not present

## 2019-07-25 DIAGNOSIS — G301 Alzheimer's disease with late onset: Secondary | ICD-10-CM | POA: Diagnosis not present

## 2019-07-25 DIAGNOSIS — R1310 Dysphagia, unspecified: Secondary | ICD-10-CM | POA: Diagnosis not present

## 2019-07-25 DIAGNOSIS — I10 Essential (primary) hypertension: Secondary | ICD-10-CM | POA: Diagnosis not present

## 2019-07-25 DIAGNOSIS — F331 Major depressive disorder, recurrent, moderate: Secondary | ICD-10-CM | POA: Diagnosis not present

## 2019-07-27 DIAGNOSIS — E785 Hyperlipidemia, unspecified: Secondary | ICD-10-CM | POA: Diagnosis not present

## 2019-07-27 DIAGNOSIS — E039 Hypothyroidism, unspecified: Secondary | ICD-10-CM | POA: Diagnosis not present

## 2019-07-27 DIAGNOSIS — N183 Chronic kidney disease, stage 3 unspecified: Secondary | ICD-10-CM | POA: Diagnosis not present

## 2019-07-27 DIAGNOSIS — I129 Hypertensive chronic kidney disease with stage 1 through stage 4 chronic kidney disease, or unspecified chronic kidney disease: Secondary | ICD-10-CM | POA: Diagnosis not present

## 2019-08-05 DIAGNOSIS — U071 COVID-19: Secondary | ICD-10-CM | POA: Diagnosis not present

## 2019-08-09 DIAGNOSIS — U071 COVID-19: Secondary | ICD-10-CM | POA: Diagnosis not present

## 2019-08-15 DIAGNOSIS — U071 COVID-19: Secondary | ICD-10-CM | POA: Diagnosis not present

## 2019-08-22 DIAGNOSIS — U071 COVID-19: Secondary | ICD-10-CM | POA: Diagnosis not present

## 2019-09-11 DIAGNOSIS — N189 Chronic kidney disease, unspecified: Secondary | ICD-10-CM | POA: Diagnosis not present

## 2019-09-25 DIAGNOSIS — I129 Hypertensive chronic kidney disease with stage 1 through stage 4 chronic kidney disease, or unspecified chronic kidney disease: Secondary | ICD-10-CM | POA: Diagnosis not present

## 2019-09-25 DIAGNOSIS — R634 Abnormal weight loss: Secondary | ICD-10-CM | POA: Diagnosis not present

## 2019-09-25 DIAGNOSIS — E039 Hypothyroidism, unspecified: Secondary | ICD-10-CM | POA: Diagnosis not present

## 2019-09-25 DIAGNOSIS — N1832 Chronic kidney disease, stage 3b: Secondary | ICD-10-CM | POA: Diagnosis not present

## 2019-10-17 DIAGNOSIS — U071 COVID-19: Secondary | ICD-10-CM | POA: Diagnosis not present

## 2019-10-23 DIAGNOSIS — U071 COVID-19: Secondary | ICD-10-CM | POA: Diagnosis not present

## 2019-10-31 DIAGNOSIS — U071 COVID-19: Secondary | ICD-10-CM | POA: Diagnosis not present

## 2019-11-06 DIAGNOSIS — U071 COVID-19: Secondary | ICD-10-CM | POA: Diagnosis not present

## 2019-11-10 IMAGING — CT CT CHEST W/O CM
2 of 4 series · 15 of 36 positions shown, 18 images · non-contrast
Comparison: CT abdomen 08/13/2017 and CT chest 06/08/2011.

CLINICAL DATA: Shortness of breath on exertion for 4-6 months.
Right lung nodule on CT abdomen 08/13/2017.

EXAM:
CT CHEST WITHOUT CONTRAST
TECHNIQUE: Multidetector CT imaging of the chest was performed following the
standard protocol without IV contrast.

[Series 2: chest · axial · 0.66mm/px · z∈[-1103,-855]mm · 12 of 148 slices shown, 15 images (1 of 2)]
[im 12/148  mediastinal]
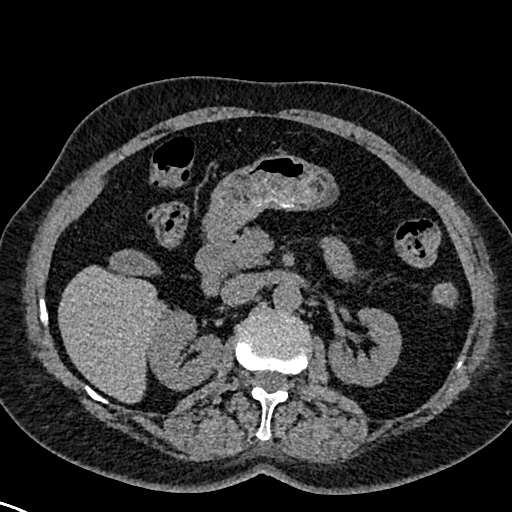
[im 12/148  lung]
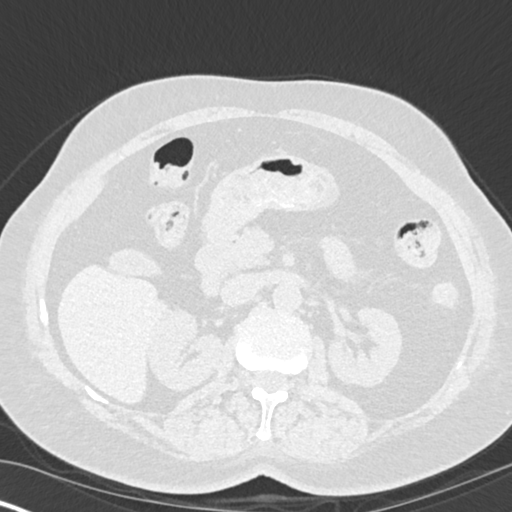
[im 23/148  lung]
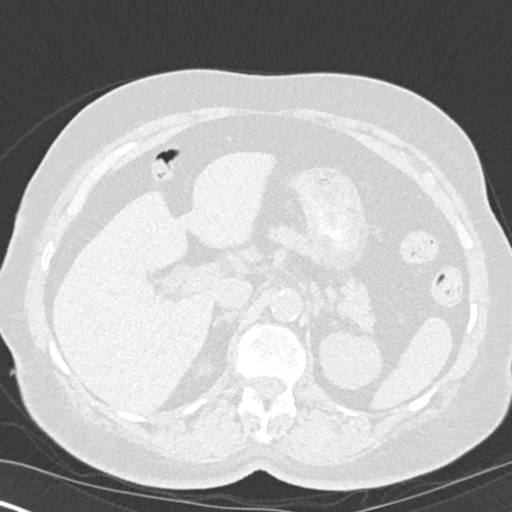
[im 34/148  lung]
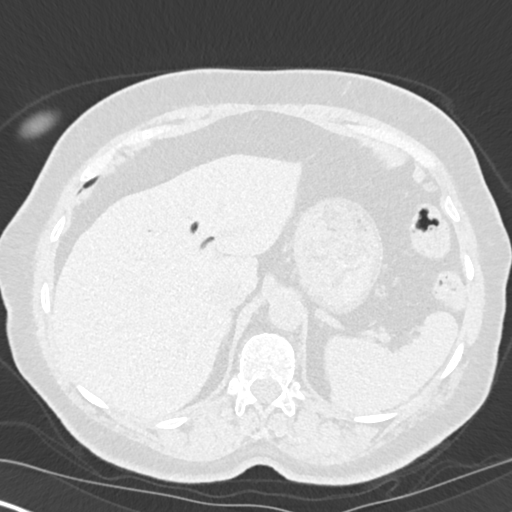
[im 46/148  lung]
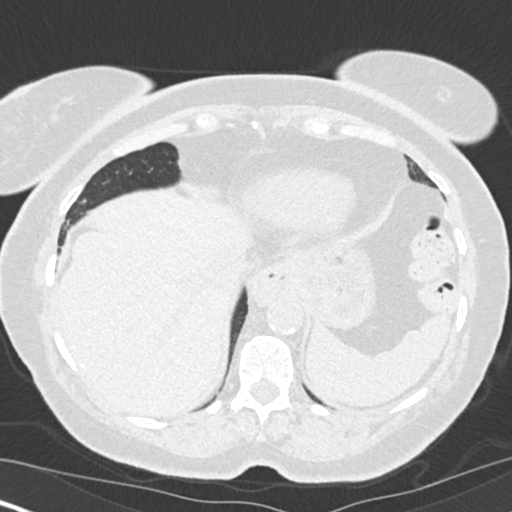
[im 57/148  mediastinal]
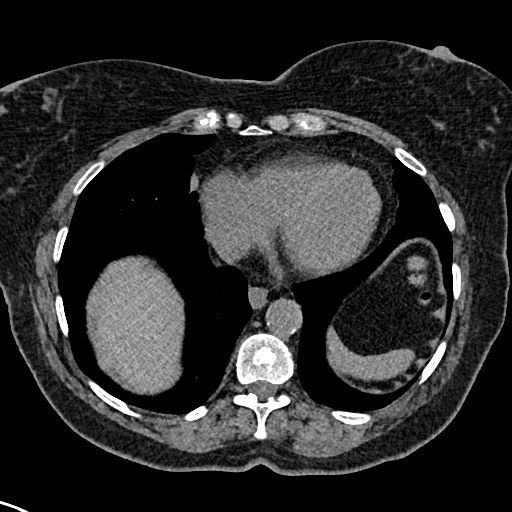
[im 57/148  lung]
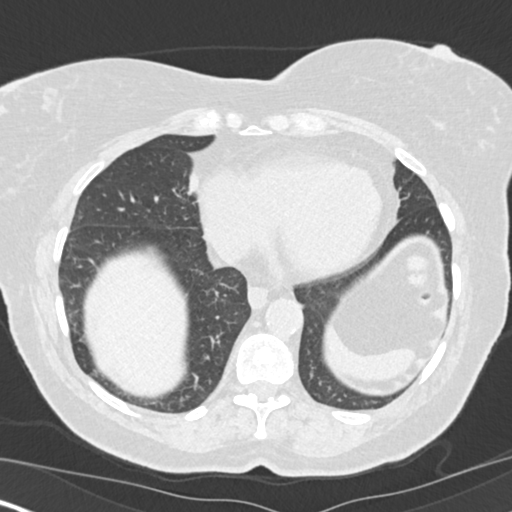
[im 68/148  lung]
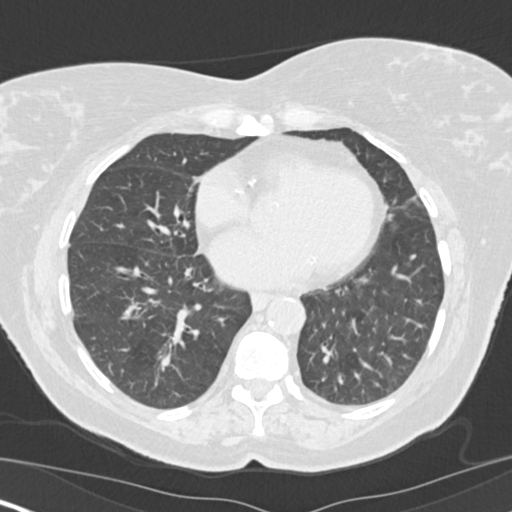
[im 80/148  lung]
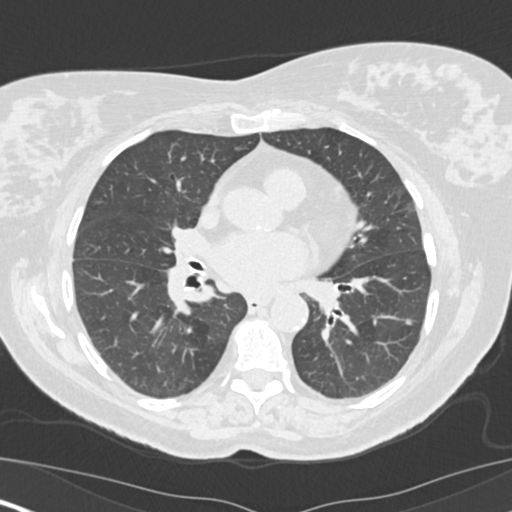
[im 91/148  lung]
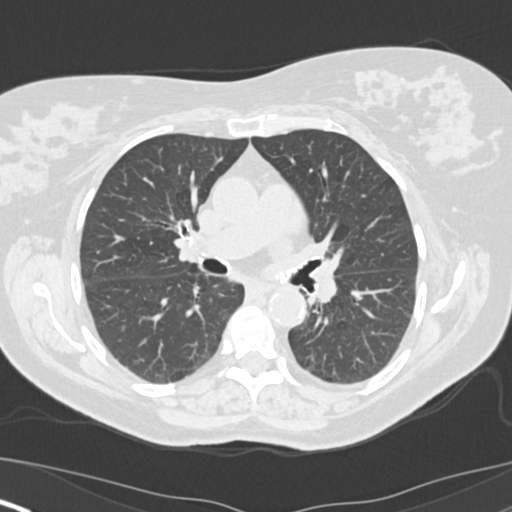
[im 102/148  mediastinal]
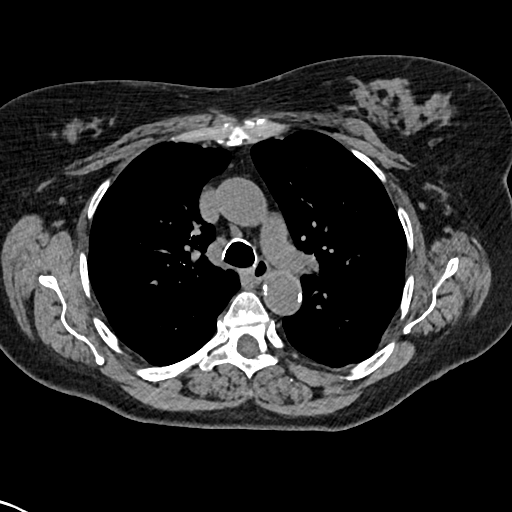
[im 102/148  lung]
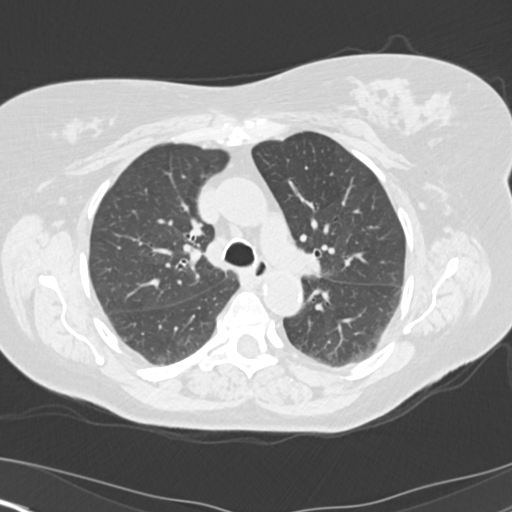
[im 114/148  lung]
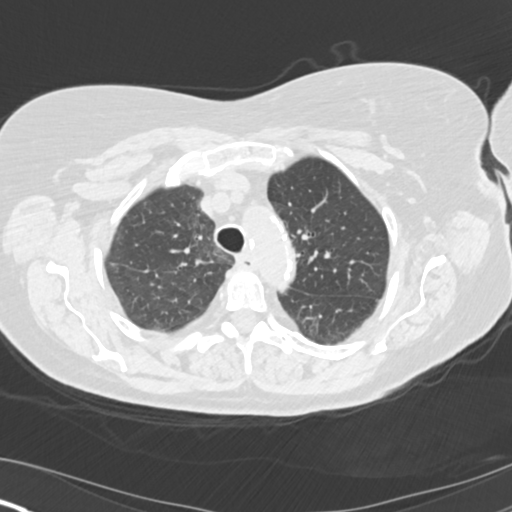
[im 125/148  lung]
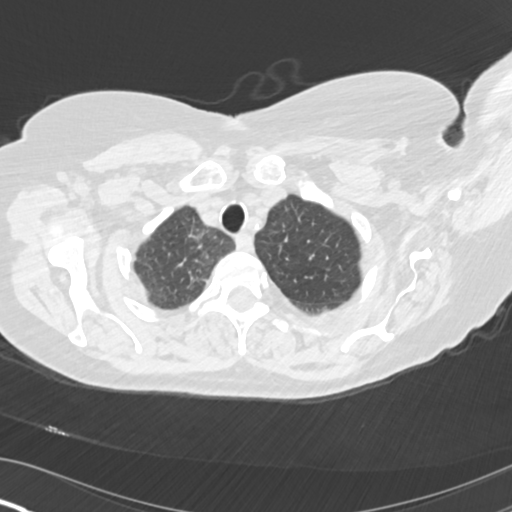
[im 136/148  lung]
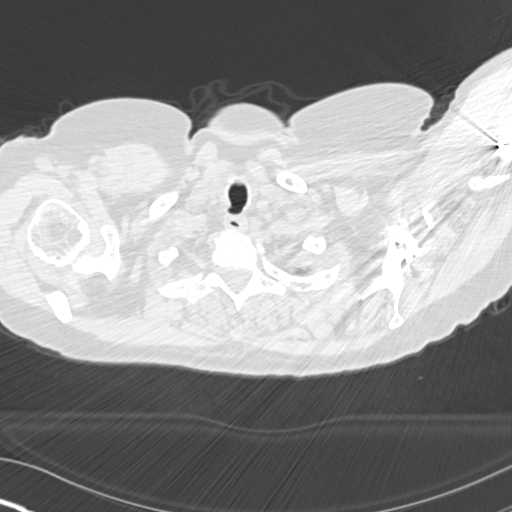

[Series 5: chest · coronal · 0.58mm/px · 3 of 145 slices shown (2 of 2)]
[im 29/145  lung]
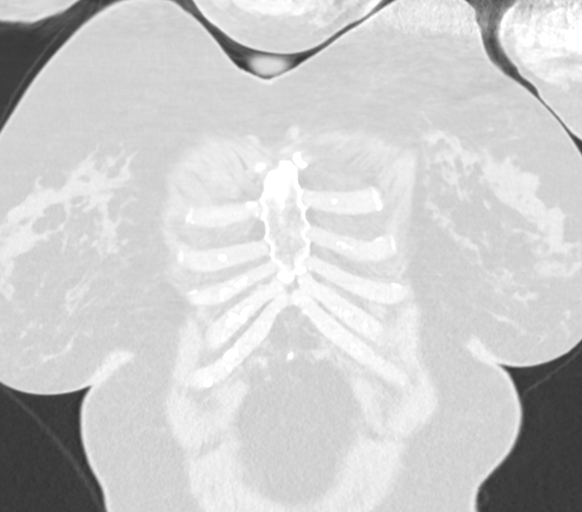
[im 58/145  lung]
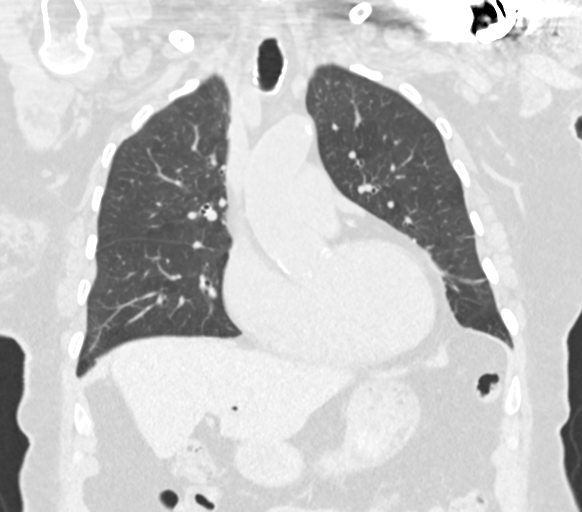
[im 87/145  lung]
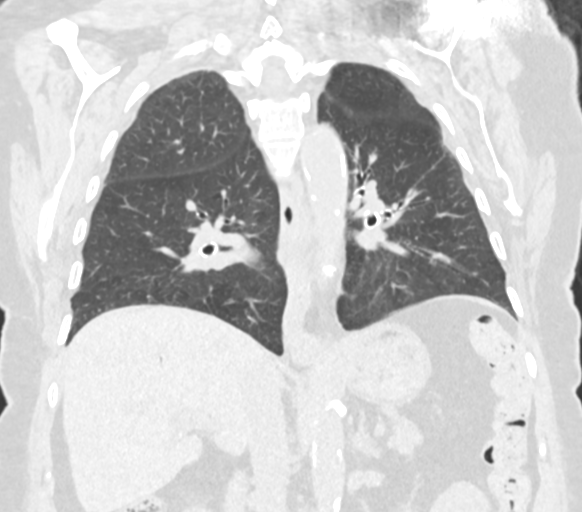

[15 of 36 positions shown; findings below may reference images not displayed]

FINDINGS: Cardiovascular: Atherosclerotic calcification of the arterial
vasculature, including coronary arteries. Heart size normal. No
pericardial effusion.

Mediastinum/Nodes: No pathologically enlarged mediastinal or
axillary lymph nodes. Hilar regions are difficult to evaluate
without IV contrast. Esophagus is grossly unremarkable. Tiny hiatal
hernia.

Lungs/Pleura: Right apical pleuroparenchymal scarring. Mild mosaic
pulmonary parenchymal attenuation is nonspecific. 4 mm right middle
lobe nodule (series 3, image 89), again seen and likely present on
06/08/2011. 4 mm left lower lobe nodule, also likely present on
06/08/2011. No pleural fluid. Airway is unremarkable.

Upper Abdomen: Pneumobilia may be due to sphincter of Oddi
incompetence. Visualized portions of the liver, gallbladder and
right adrenal gland are unremarkable. Slight nodularity of the left
adrenal gland. Small right renal stones. Visualized portions of the
kidneys, spleen, pancreas, stomach and bowel are grossly
unremarkable with exception of a tiny hiatal hernia. No upper
abdominal adenopathy.

Musculoskeletal: Degenerative changes in the spine. Left shoulder
arthroplasty. No worrisome lytic or sclerotic lesions.
IMPRESSION: 1. Tiny right middle and left lower lobe nodules, likely present on
06/08/2011, favoring benign lesions. Continued attention on
follow-up exams is warranted.
2. Aortic atherosclerosis ([id number]-170.0). Coronary artery
calcification.
3. Right renal stones.

## 2019-11-20 DIAGNOSIS — H04123 Dry eye syndrome of bilateral lacrimal glands: Secondary | ICD-10-CM | POA: Diagnosis not present

## 2019-11-20 DIAGNOSIS — H21543 Posterior synechiae (iris), bilateral: Secondary | ICD-10-CM | POA: Diagnosis not present

## 2019-11-20 DIAGNOSIS — H2513 Age-related nuclear cataract, bilateral: Secondary | ICD-10-CM | POA: Diagnosis not present

## 2019-11-21 DIAGNOSIS — U071 COVID-19: Secondary | ICD-10-CM | POA: Diagnosis not present

## 2019-11-28 DIAGNOSIS — U071 COVID-19: Secondary | ICD-10-CM | POA: Diagnosis not present

## 2019-11-29 DIAGNOSIS — I2510 Atherosclerotic heart disease of native coronary artery without angina pectoris: Secondary | ICD-10-CM | POA: Diagnosis not present

## 2019-11-29 DIAGNOSIS — G301 Alzheimer's disease with late onset: Secondary | ICD-10-CM | POA: Diagnosis not present

## 2019-11-29 DIAGNOSIS — I129 Hypertensive chronic kidney disease with stage 1 through stage 4 chronic kidney disease, or unspecified chronic kidney disease: Secondary | ICD-10-CM | POA: Diagnosis not present

## 2019-11-29 DIAGNOSIS — N1832 Chronic kidney disease, stage 3b: Secondary | ICD-10-CM | POA: Diagnosis not present

## 2019-12-04 DIAGNOSIS — U071 COVID-19: Secondary | ICD-10-CM | POA: Diagnosis not present

## 2019-12-20 DIAGNOSIS — U071 COVID-19: Secondary | ICD-10-CM | POA: Diagnosis not present

## 2019-12-26 DIAGNOSIS — U071 COVID-19: Secondary | ICD-10-CM | POA: Diagnosis not present

## 2020-01-01 DIAGNOSIS — U071 COVID-19: Secondary | ICD-10-CM | POA: Diagnosis not present

## 2020-01-09 DIAGNOSIS — U071 COVID-19: Secondary | ICD-10-CM | POA: Diagnosis not present

## 2020-01-15 DIAGNOSIS — U071 COVID-19: Secondary | ICD-10-CM | POA: Diagnosis not present

## 2020-01-24 DIAGNOSIS — Z20822 Contact with and (suspected) exposure to covid-19: Secondary | ICD-10-CM | POA: Diagnosis not present

## 2020-01-30 DIAGNOSIS — U071 COVID-19: Secondary | ICD-10-CM | POA: Diagnosis not present

## 2020-02-05 DIAGNOSIS — U071 COVID-19: Secondary | ICD-10-CM | POA: Diagnosis not present

## 2020-02-13 DIAGNOSIS — U071 COVID-19: Secondary | ICD-10-CM | POA: Diagnosis not present

## 2020-02-20 DIAGNOSIS — U071 COVID-19: Secondary | ICD-10-CM | POA: Diagnosis not present

## 2020-02-26 DIAGNOSIS — U071 COVID-19: Secondary | ICD-10-CM | POA: Diagnosis not present

## 2020-03-05 DIAGNOSIS — U071 COVID-19: Secondary | ICD-10-CM | POA: Diagnosis not present

## 2020-03-06 DIAGNOSIS — R252 Cramp and spasm: Secondary | ICD-10-CM | POA: Diagnosis not present

## 2020-03-06 DIAGNOSIS — K625 Hemorrhage of anus and rectum: Secondary | ICD-10-CM | POA: Diagnosis not present

## 2020-03-06 DIAGNOSIS — G301 Alzheimer's disease with late onset: Secondary | ICD-10-CM | POA: Diagnosis not present

## 2020-03-06 DIAGNOSIS — R3981 Functional urinary incontinence: Secondary | ICD-10-CM | POA: Diagnosis not present

## 2020-03-07 DIAGNOSIS — N1832 Chronic kidney disease, stage 3b: Secondary | ICD-10-CM | POA: Diagnosis not present

## 2020-03-07 DIAGNOSIS — R252 Cramp and spasm: Secondary | ICD-10-CM | POA: Diagnosis not present

## 2020-03-07 DIAGNOSIS — Z79899 Other long term (current) drug therapy: Secondary | ICD-10-CM | POA: Diagnosis not present

## 2020-03-07 DIAGNOSIS — G301 Alzheimer's disease with late onset: Secondary | ICD-10-CM | POA: Diagnosis not present

## 2020-03-07 DIAGNOSIS — E039 Hypothyroidism, unspecified: Secondary | ICD-10-CM | POA: Diagnosis not present

## 2020-03-13 DIAGNOSIS — U071 COVID-19: Secondary | ICD-10-CM | POA: Diagnosis not present

## 2020-03-18 DIAGNOSIS — U071 COVID-19: Secondary | ICD-10-CM | POA: Diagnosis not present

## 2020-03-25 DIAGNOSIS — U071 COVID-19: Secondary | ICD-10-CM | POA: Diagnosis not present

## 2020-04-01 DIAGNOSIS — U071 COVID-19: Secondary | ICD-10-CM | POA: Diagnosis not present

## 2020-04-07 DIAGNOSIS — U071 COVID-19: Secondary | ICD-10-CM | POA: Diagnosis not present

## 2020-04-14 DIAGNOSIS — U071 COVID-19: Secondary | ICD-10-CM | POA: Diagnosis not present

## 2020-04-17 DIAGNOSIS — U071 COVID-19: Secondary | ICD-10-CM | POA: Diagnosis not present

## 2020-04-18 DIAGNOSIS — E039 Hypothyroidism, unspecified: Secondary | ICD-10-CM | POA: Diagnosis not present

## 2020-04-22 DIAGNOSIS — U071 COVID-19: Secondary | ICD-10-CM | POA: Diagnosis not present

## 2020-04-29 DIAGNOSIS — U071 COVID-19: Secondary | ICD-10-CM | POA: Diagnosis not present

## 2020-05-05 DIAGNOSIS — U071 COVID-19: Secondary | ICD-10-CM | POA: Diagnosis not present

## 2020-05-12 DIAGNOSIS — U071 COVID-19: Secondary | ICD-10-CM | POA: Diagnosis not present

## 2020-05-16 DIAGNOSIS — M25511 Pain in right shoulder: Secondary | ICD-10-CM | POA: Diagnosis not present

## 2020-05-16 DIAGNOSIS — G301 Alzheimer's disease with late onset: Secondary | ICD-10-CM | POA: Diagnosis not present

## 2020-05-16 DIAGNOSIS — F331 Major depressive disorder, recurrent, moderate: Secondary | ICD-10-CM | POA: Diagnosis not present

## 2020-05-20 DIAGNOSIS — H2513 Age-related nuclear cataract, bilateral: Secondary | ICD-10-CM | POA: Diagnosis not present

## 2020-05-24 DIAGNOSIS — U071 COVID-19: Secondary | ICD-10-CM | POA: Diagnosis not present

## 2020-05-27 DIAGNOSIS — U071 COVID-19: Secondary | ICD-10-CM | POA: Diagnosis not present

## 2020-05-30 DIAGNOSIS — M7521 Bicipital tendinitis, right shoulder: Secondary | ICD-10-CM | POA: Diagnosis not present

## 2020-05-30 DIAGNOSIS — I10 Essential (primary) hypertension: Secondary | ICD-10-CM | POA: Diagnosis not present

## 2020-06-02 DIAGNOSIS — U071 COVID-19: Secondary | ICD-10-CM | POA: Diagnosis not present

## 2020-06-17 DIAGNOSIS — E039 Hypothyroidism, unspecified: Secondary | ICD-10-CM | POA: Diagnosis not present

## 2020-07-22 DIAGNOSIS — R0602 Shortness of breath: Secondary | ICD-10-CM | POA: Diagnosis not present

## 2020-07-22 DIAGNOSIS — G301 Alzheimer's disease with late onset: Secondary | ICD-10-CM | POA: Diagnosis not present

## 2020-07-22 DIAGNOSIS — I2510 Atherosclerotic heart disease of native coronary artery without angina pectoris: Secondary | ICD-10-CM | POA: Diagnosis not present

## 2020-07-25 DIAGNOSIS — H2513 Age-related nuclear cataract, bilateral: Secondary | ICD-10-CM | POA: Diagnosis not present

## 2020-08-01 DIAGNOSIS — E039 Hypothyroidism, unspecified: Secondary | ICD-10-CM | POA: Diagnosis not present

## 2020-08-08 DIAGNOSIS — N2889 Other specified disorders of kidney and ureter: Secondary | ICD-10-CM | POA: Diagnosis not present

## 2020-08-08 DIAGNOSIS — R1909 Other intra-abdominal and pelvic swelling, mass and lump: Secondary | ICD-10-CM | POA: Diagnosis not present

## 2020-08-08 DIAGNOSIS — R911 Solitary pulmonary nodule: Secondary | ICD-10-CM | POA: Diagnosis not present

## 2020-08-08 DIAGNOSIS — J398 Other specified diseases of upper respiratory tract: Secondary | ICD-10-CM | POA: Diagnosis not present

## 2020-08-08 DIAGNOSIS — I517 Cardiomegaly: Secondary | ICD-10-CM | POA: Diagnosis not present

## 2020-08-15 DIAGNOSIS — K219 Gastro-esophageal reflux disease without esophagitis: Secondary | ICD-10-CM | POA: Diagnosis not present

## 2020-08-15 DIAGNOSIS — Z79899 Other long term (current) drug therapy: Secondary | ICD-10-CM | POA: Diagnosis not present

## 2020-08-15 DIAGNOSIS — I10 Essential (primary) hypertension: Secondary | ICD-10-CM | POA: Diagnosis not present

## 2020-08-15 DIAGNOSIS — H2511 Age-related nuclear cataract, right eye: Secondary | ICD-10-CM | POA: Diagnosis not present

## 2020-08-15 DIAGNOSIS — H25811 Combined forms of age-related cataract, right eye: Secondary | ICD-10-CM | POA: Diagnosis not present

## 2020-09-02 DIAGNOSIS — U071 COVID-19: Secondary | ICD-10-CM | POA: Diagnosis not present

## 2020-09-05 DIAGNOSIS — H2512 Age-related nuclear cataract, left eye: Secondary | ICD-10-CM | POA: Diagnosis not present

## 2020-09-05 DIAGNOSIS — H25812 Combined forms of age-related cataract, left eye: Secondary | ICD-10-CM | POA: Diagnosis not present

## 2020-09-05 DIAGNOSIS — H25811 Combined forms of age-related cataract, right eye: Secondary | ICD-10-CM | POA: Diagnosis not present

## 2020-09-05 DIAGNOSIS — Z79899 Other long term (current) drug therapy: Secondary | ICD-10-CM | POA: Diagnosis not present

## 2020-09-05 DIAGNOSIS — I10 Essential (primary) hypertension: Secondary | ICD-10-CM | POA: Diagnosis not present

## 2020-09-05 DIAGNOSIS — K219 Gastro-esophageal reflux disease without esophagitis: Secondary | ICD-10-CM | POA: Diagnosis not present

## 2020-09-10 DIAGNOSIS — J060 Acute laryngopharyngitis: Secondary | ICD-10-CM | POA: Diagnosis not present

## 2020-09-10 DIAGNOSIS — D49511 Neoplasm of unspecified behavior of right kidney: Secondary | ICD-10-CM | POA: Diagnosis not present

## 2020-09-18 DIAGNOSIS — J301 Allergic rhinitis due to pollen: Secondary | ICD-10-CM | POA: Diagnosis not present

## 2020-09-18 DIAGNOSIS — J060 Acute laryngopharyngitis: Secondary | ICD-10-CM | POA: Diagnosis not present

## 2020-09-24 DIAGNOSIS — N2889 Other specified disorders of kidney and ureter: Secondary | ICD-10-CM | POA: Diagnosis not present

## 2020-10-31 DIAGNOSIS — H903 Sensorineural hearing loss, bilateral: Secondary | ICD-10-CM | POA: Diagnosis not present

## 2020-11-18 DIAGNOSIS — R0602 Shortness of breath: Secondary | ICD-10-CM | POA: Diagnosis not present

## 2020-11-19 DIAGNOSIS — Z79899 Other long term (current) drug therapy: Secondary | ICD-10-CM | POA: Diagnosis not present

## 2020-11-19 DIAGNOSIS — R0902 Hypoxemia: Secondary | ICD-10-CM | POA: Diagnosis not present

## 2020-11-19 DIAGNOSIS — N1832 Chronic kidney disease, stage 3b: Secondary | ICD-10-CM | POA: Diagnosis not present

## 2020-11-19 DIAGNOSIS — E785 Hyperlipidemia, unspecified: Secondary | ICD-10-CM | POA: Diagnosis not present

## 2020-11-19 DIAGNOSIS — I129 Hypertensive chronic kidney disease with stage 1 through stage 4 chronic kidney disease, or unspecified chronic kidney disease: Secondary | ICD-10-CM | POA: Diagnosis not present

## 2020-11-20 DIAGNOSIS — D122 Benign neoplasm of ascending colon: Secondary | ICD-10-CM | POA: Diagnosis not present

## 2020-11-20 DIAGNOSIS — D649 Anemia, unspecified: Secondary | ICD-10-CM | POA: Diagnosis not present

## 2020-11-20 DIAGNOSIS — K5732 Diverticulitis of large intestine without perforation or abscess without bleeding: Secondary | ICD-10-CM | POA: Diagnosis not present

## 2020-11-20 DIAGNOSIS — D120 Benign neoplasm of cecum: Secondary | ICD-10-CM | POA: Diagnosis not present

## 2020-11-20 DIAGNOSIS — I10 Essential (primary) hypertension: Secondary | ICD-10-CM | POA: Diagnosis not present

## 2020-11-20 DIAGNOSIS — D509 Iron deficiency anemia, unspecified: Secondary | ICD-10-CM | POA: Diagnosis not present

## 2020-11-20 DIAGNOSIS — F0390 Unspecified dementia without behavioral disturbance: Secondary | ICD-10-CM | POA: Diagnosis not present

## 2020-11-20 DIAGNOSIS — R0602 Shortness of breath: Secondary | ICD-10-CM | POA: Diagnosis not present

## 2020-11-20 DIAGNOSIS — K621 Rectal polyp: Secondary | ICD-10-CM | POA: Diagnosis not present

## 2020-11-20 DIAGNOSIS — N2889 Other specified disorders of kidney and ureter: Secondary | ICD-10-CM | POA: Diagnosis not present

## 2020-11-20 DIAGNOSIS — D5521 Anemia due to pyruvate kinase deficiency: Secondary | ICD-10-CM | POA: Diagnosis not present

## 2020-11-20 DIAGNOSIS — F4310 Post-traumatic stress disorder, unspecified: Secondary | ICD-10-CM | POA: Diagnosis not present

## 2020-11-20 DIAGNOSIS — N289 Disorder of kidney and ureter, unspecified: Secondary | ICD-10-CM | POA: Diagnosis not present

## 2020-11-20 DIAGNOSIS — R1901 Right upper quadrant abdominal swelling, mass and lump: Secondary | ICD-10-CM | POA: Diagnosis not present

## 2020-11-20 DIAGNOSIS — K648 Other hemorrhoids: Secondary | ICD-10-CM | POA: Diagnosis not present

## 2020-11-20 DIAGNOSIS — K449 Diaphragmatic hernia without obstruction or gangrene: Secondary | ICD-10-CM | POA: Diagnosis not present

## 2020-11-20 DIAGNOSIS — E079 Disorder of thyroid, unspecified: Secondary | ICD-10-CM | POA: Diagnosis not present

## 2020-11-20 DIAGNOSIS — K219 Gastro-esophageal reflux disease without esophagitis: Secondary | ICD-10-CM | POA: Diagnosis not present

## 2020-11-21 DIAGNOSIS — I10 Essential (primary) hypertension: Secondary | ICD-10-CM | POA: Diagnosis not present

## 2020-11-21 DIAGNOSIS — K219 Gastro-esophageal reflux disease without esophagitis: Secondary | ICD-10-CM | POA: Diagnosis not present

## 2020-11-21 DIAGNOSIS — D649 Anemia, unspecified: Secondary | ICD-10-CM | POA: Diagnosis not present

## 2020-11-21 DIAGNOSIS — F4310 Post-traumatic stress disorder, unspecified: Secondary | ICD-10-CM | POA: Diagnosis not present

## 2020-11-21 DIAGNOSIS — E079 Disorder of thyroid, unspecified: Secondary | ICD-10-CM | POA: Diagnosis not present

## 2020-11-21 DIAGNOSIS — I080 Rheumatic disorders of both mitral and aortic valves: Secondary | ICD-10-CM | POA: Diagnosis not present

## 2020-11-21 DIAGNOSIS — F0390 Unspecified dementia without behavioral disturbance: Secondary | ICD-10-CM | POA: Diagnosis not present

## 2020-11-21 DIAGNOSIS — D509 Iron deficiency anemia, unspecified: Secondary | ICD-10-CM | POA: Diagnosis not present

## 2020-11-22 DIAGNOSIS — N289 Disorder of kidney and ureter, unspecified: Secondary | ICD-10-CM | POA: Diagnosis not present

## 2020-11-22 DIAGNOSIS — E079 Disorder of thyroid, unspecified: Secondary | ICD-10-CM | POA: Diagnosis not present

## 2020-11-22 DIAGNOSIS — F4310 Post-traumatic stress disorder, unspecified: Secondary | ICD-10-CM | POA: Diagnosis not present

## 2020-11-22 DIAGNOSIS — F0390 Unspecified dementia without behavioral disturbance: Secondary | ICD-10-CM | POA: Diagnosis not present

## 2020-11-22 DIAGNOSIS — I10 Essential (primary) hypertension: Secondary | ICD-10-CM | POA: Diagnosis not present

## 2020-11-22 DIAGNOSIS — D649 Anemia, unspecified: Secondary | ICD-10-CM | POA: Diagnosis not present

## 2020-11-22 DIAGNOSIS — K219 Gastro-esophageal reflux disease without esophagitis: Secondary | ICD-10-CM | POA: Diagnosis not present

## 2020-11-23 DIAGNOSIS — I10 Essential (primary) hypertension: Secondary | ICD-10-CM | POA: Diagnosis not present

## 2020-11-23 DIAGNOSIS — K6389 Other specified diseases of intestine: Secondary | ICD-10-CM | POA: Diagnosis not present

## 2020-11-23 DIAGNOSIS — K635 Polyp of colon: Secondary | ICD-10-CM | POA: Diagnosis not present

## 2020-11-23 DIAGNOSIS — K648 Other hemorrhoids: Secondary | ICD-10-CM | POA: Diagnosis not present

## 2020-11-23 DIAGNOSIS — K5140 Inflammatory polyps of colon without complications: Secondary | ICD-10-CM | POA: Diagnosis not present

## 2020-11-23 DIAGNOSIS — D120 Benign neoplasm of cecum: Secondary | ICD-10-CM | POA: Diagnosis not present

## 2020-11-23 DIAGNOSIS — D649 Anemia, unspecified: Secondary | ICD-10-CM | POA: Diagnosis not present

## 2020-11-23 DIAGNOSIS — D128 Benign neoplasm of rectum: Secondary | ICD-10-CM | POA: Diagnosis not present

## 2020-11-23 DIAGNOSIS — D509 Iron deficiency anemia, unspecified: Secondary | ICD-10-CM | POA: Diagnosis not present

## 2020-11-23 DIAGNOSIS — K5730 Diverticulosis of large intestine without perforation or abscess without bleeding: Secondary | ICD-10-CM | POA: Diagnosis not present

## 2020-11-23 DIAGNOSIS — F4310 Post-traumatic stress disorder, unspecified: Secondary | ICD-10-CM | POA: Diagnosis not present

## 2020-11-23 DIAGNOSIS — N289 Disorder of kidney and ureter, unspecified: Secondary | ICD-10-CM | POA: Diagnosis not present

## 2020-11-23 DIAGNOSIS — F0390 Unspecified dementia without behavioral disturbance: Secondary | ICD-10-CM | POA: Diagnosis not present

## 2020-11-23 DIAGNOSIS — K3189 Other diseases of stomach and duodenum: Secondary | ICD-10-CM | POA: Diagnosis not present

## 2020-11-23 DIAGNOSIS — E079 Disorder of thyroid, unspecified: Secondary | ICD-10-CM | POA: Diagnosis not present

## 2020-11-23 DIAGNOSIS — K219 Gastro-esophageal reflux disease without esophagitis: Secondary | ICD-10-CM | POA: Diagnosis not present

## 2020-11-23 DIAGNOSIS — K449 Diaphragmatic hernia without obstruction or gangrene: Secondary | ICD-10-CM | POA: Diagnosis not present

## 2020-11-24 DIAGNOSIS — K219 Gastro-esophageal reflux disease without esophagitis: Secondary | ICD-10-CM | POA: Diagnosis not present

## 2020-11-24 DIAGNOSIS — F4310 Post-traumatic stress disorder, unspecified: Secondary | ICD-10-CM | POA: Diagnosis not present

## 2020-11-24 DIAGNOSIS — N289 Disorder of kidney and ureter, unspecified: Secondary | ICD-10-CM | POA: Diagnosis not present

## 2020-11-24 DIAGNOSIS — E079 Disorder of thyroid, unspecified: Secondary | ICD-10-CM | POA: Diagnosis not present

## 2020-11-24 DIAGNOSIS — I10 Essential (primary) hypertension: Secondary | ICD-10-CM | POA: Diagnosis not present

## 2020-11-24 DIAGNOSIS — F0390 Unspecified dementia without behavioral disturbance: Secondary | ICD-10-CM | POA: Diagnosis not present

## 2020-11-24 DIAGNOSIS — D649 Anemia, unspecified: Secondary | ICD-10-CM | POA: Diagnosis not present

## 2020-11-25 DIAGNOSIS — F4310 Post-traumatic stress disorder, unspecified: Secondary | ICD-10-CM | POA: Diagnosis not present

## 2020-11-25 DIAGNOSIS — E079 Disorder of thyroid, unspecified: Secondary | ICD-10-CM | POA: Diagnosis not present

## 2020-11-25 DIAGNOSIS — D649 Anemia, unspecified: Secondary | ICD-10-CM | POA: Diagnosis not present

## 2020-11-25 DIAGNOSIS — I10 Essential (primary) hypertension: Secondary | ICD-10-CM | POA: Diagnosis not present

## 2020-11-25 DIAGNOSIS — K219 Gastro-esophageal reflux disease without esophagitis: Secondary | ICD-10-CM | POA: Diagnosis not present

## 2020-11-25 DIAGNOSIS — F0390 Unspecified dementia without behavioral disturbance: Secondary | ICD-10-CM | POA: Diagnosis not present

## 2020-11-25 DIAGNOSIS — N289 Disorder of kidney and ureter, unspecified: Secondary | ICD-10-CM | POA: Diagnosis not present

## 2020-11-26 DIAGNOSIS — N289 Disorder of kidney and ureter, unspecified: Secondary | ICD-10-CM | POA: Diagnosis not present

## 2020-11-26 DIAGNOSIS — F0390 Unspecified dementia without behavioral disturbance: Secondary | ICD-10-CM | POA: Diagnosis not present

## 2020-11-26 DIAGNOSIS — I10 Essential (primary) hypertension: Secondary | ICD-10-CM | POA: Diagnosis not present

## 2020-11-26 DIAGNOSIS — D649 Anemia, unspecified: Secondary | ICD-10-CM | POA: Diagnosis not present

## 2020-11-26 DIAGNOSIS — F4310 Post-traumatic stress disorder, unspecified: Secondary | ICD-10-CM | POA: Diagnosis not present

## 2020-11-26 DIAGNOSIS — K219 Gastro-esophageal reflux disease without esophagitis: Secondary | ICD-10-CM | POA: Diagnosis not present

## 2020-11-26 DIAGNOSIS — E079 Disorder of thyroid, unspecified: Secondary | ICD-10-CM | POA: Diagnosis not present

## 2020-11-27 DIAGNOSIS — I10 Essential (primary) hypertension: Secondary | ICD-10-CM | POA: Diagnosis not present

## 2020-11-27 DIAGNOSIS — N2889 Other specified disorders of kidney and ureter: Secondary | ICD-10-CM | POA: Diagnosis not present

## 2020-11-27 DIAGNOSIS — K219 Gastro-esophageal reflux disease without esophagitis: Secondary | ICD-10-CM | POA: Diagnosis not present

## 2020-11-27 DIAGNOSIS — F4310 Post-traumatic stress disorder, unspecified: Secondary | ICD-10-CM | POA: Diagnosis not present

## 2020-11-27 DIAGNOSIS — E079 Disorder of thyroid, unspecified: Secondary | ICD-10-CM | POA: Diagnosis not present

## 2020-11-27 DIAGNOSIS — F0390 Unspecified dementia without behavioral disturbance: Secondary | ICD-10-CM | POA: Diagnosis not present

## 2020-11-27 DIAGNOSIS — R918 Other nonspecific abnormal finding of lung field: Secondary | ICD-10-CM | POA: Diagnosis not present

## 2020-11-27 DIAGNOSIS — D649 Anemia, unspecified: Secondary | ICD-10-CM | POA: Diagnosis not present

## 2020-11-28 DIAGNOSIS — N2889 Other specified disorders of kidney and ureter: Secondary | ICD-10-CM | POA: Diagnosis not present

## 2020-11-28 DIAGNOSIS — E079 Disorder of thyroid, unspecified: Secondary | ICD-10-CM | POA: Diagnosis not present

## 2020-11-28 DIAGNOSIS — F0390 Unspecified dementia without behavioral disturbance: Secondary | ICD-10-CM | POA: Diagnosis not present

## 2020-11-28 DIAGNOSIS — F4310 Post-traumatic stress disorder, unspecified: Secondary | ICD-10-CM | POA: Diagnosis not present

## 2020-11-28 DIAGNOSIS — K219 Gastro-esophageal reflux disease without esophagitis: Secondary | ICD-10-CM | POA: Diagnosis not present

## 2020-11-28 DIAGNOSIS — I10 Essential (primary) hypertension: Secondary | ICD-10-CM | POA: Diagnosis not present

## 2020-11-28 DIAGNOSIS — D649 Anemia, unspecified: Secondary | ICD-10-CM | POA: Diagnosis not present

## 2020-12-04 DIAGNOSIS — K5901 Slow transit constipation: Secondary | ICD-10-CM | POA: Diagnosis not present

## 2020-12-04 DIAGNOSIS — K635 Polyp of colon: Secondary | ICD-10-CM | POA: Diagnosis not present

## 2020-12-04 DIAGNOSIS — D6481 Anemia due to antineoplastic chemotherapy: Secondary | ICD-10-CM | POA: Diagnosis not present

## 2020-12-05 DIAGNOSIS — D649 Anemia, unspecified: Secondary | ICD-10-CM | POA: Diagnosis not present

## 2020-12-05 DIAGNOSIS — E876 Hypokalemia: Secondary | ICD-10-CM | POA: Diagnosis not present

## 2020-12-09 DIAGNOSIS — D649 Anemia, unspecified: Secondary | ICD-10-CM | POA: Diagnosis not present

## 2020-12-10 DIAGNOSIS — R0602 Shortness of breath: Secondary | ICD-10-CM | POA: Diagnosis not present

## 2020-12-10 DIAGNOSIS — G2581 Restless legs syndrome: Secondary | ICD-10-CM | POA: Diagnosis not present

## 2020-12-27 DIAGNOSIS — M5416 Radiculopathy, lumbar region: Secondary | ICD-10-CM | POA: Diagnosis not present

## 2020-12-27 DIAGNOSIS — I10 Essential (primary) hypertension: Secondary | ICD-10-CM | POA: Diagnosis not present

## 2020-12-27 DIAGNOSIS — E538 Deficiency of other specified B group vitamins: Secondary | ICD-10-CM | POA: Diagnosis not present

## 2020-12-27 DIAGNOSIS — I70202 Unspecified atherosclerosis of native arteries of extremities, left leg: Secondary | ICD-10-CM | POA: Diagnosis not present

## 2020-12-31 DIAGNOSIS — D509 Iron deficiency anemia, unspecified: Secondary | ICD-10-CM | POA: Diagnosis not present

## 2020-12-31 DIAGNOSIS — M791 Myalgia, unspecified site: Secondary | ICD-10-CM | POA: Diagnosis not present

## 2020-12-31 DIAGNOSIS — Z79899 Other long term (current) drug therapy: Secondary | ICD-10-CM | POA: Diagnosis not present

## 2020-12-31 DIAGNOSIS — N179 Acute kidney failure, unspecified: Secondary | ICD-10-CM | POA: Diagnosis not present

## 2020-12-31 DIAGNOSIS — N189 Chronic kidney disease, unspecified: Secondary | ICD-10-CM | POA: Diagnosis not present

## 2021-01-02 DIAGNOSIS — D508 Other iron deficiency anemias: Secondary | ICD-10-CM | POA: Diagnosis not present

## 2021-01-02 DIAGNOSIS — M5412 Radiculopathy, cervical region: Secondary | ICD-10-CM | POA: Diagnosis not present

## 2021-01-09 DIAGNOSIS — M5412 Radiculopathy, cervical region: Secondary | ICD-10-CM | POA: Diagnosis not present

## 2021-01-09 DIAGNOSIS — I10 Essential (primary) hypertension: Secondary | ICD-10-CM | POA: Diagnosis not present

## 2021-01-28 DIAGNOSIS — R001 Bradycardia, unspecified: Secondary | ICD-10-CM | POA: Diagnosis not present

## 2021-01-28 DIAGNOSIS — R0789 Other chest pain: Secondary | ICD-10-CM | POA: Diagnosis not present

## 2021-01-28 DIAGNOSIS — R7989 Other specified abnormal findings of blood chemistry: Secondary | ICD-10-CM | POA: Diagnosis not present

## 2021-01-28 DIAGNOSIS — R059 Cough, unspecified: Secondary | ICD-10-CM | POA: Diagnosis not present

## 2021-01-28 DIAGNOSIS — R5383 Other fatigue: Secondary | ICD-10-CM | POA: Diagnosis not present

## 2021-01-28 DIAGNOSIS — I10 Essential (primary) hypertension: Secondary | ICD-10-CM | POA: Diagnosis not present

## 2021-01-28 DIAGNOSIS — R2242 Localized swelling, mass and lump, left lower limb: Secondary | ICD-10-CM | POA: Diagnosis not present

## 2021-01-28 DIAGNOSIS — R600 Localized edema: Secondary | ICD-10-CM | POA: Diagnosis not present

## 2021-01-28 DIAGNOSIS — R9431 Abnormal electrocardiogram [ECG] [EKG]: Secondary | ICD-10-CM | POA: Diagnosis not present

## 2021-01-28 DIAGNOSIS — R531 Weakness: Secondary | ICD-10-CM | POA: Diagnosis not present

## 2021-01-28 DIAGNOSIS — R0602 Shortness of breath: Secondary | ICD-10-CM | POA: Diagnosis not present

## 2021-01-28 DIAGNOSIS — Z20822 Contact with and (suspected) exposure to covid-19: Secondary | ICD-10-CM | POA: Diagnosis not present

## 2021-01-28 DIAGNOSIS — M79662 Pain in left lower leg: Secondary | ICD-10-CM | POA: Diagnosis not present

## 2021-01-30 DIAGNOSIS — R2242 Localized swelling, mass and lump, left lower limb: Secondary | ICD-10-CM | POA: Diagnosis not present

## 2021-01-30 DIAGNOSIS — N1831 Chronic kidney disease, stage 3a: Secondary | ICD-10-CM | POA: Diagnosis not present

## 2021-02-18 DIAGNOSIS — N1831 Chronic kidney disease, stage 3a: Secondary | ICD-10-CM | POA: Diagnosis not present

## 2021-02-18 DIAGNOSIS — I130 Hypertensive heart and chronic kidney disease with heart failure and stage 1 through stage 4 chronic kidney disease, or unspecified chronic kidney disease: Secondary | ICD-10-CM | POA: Diagnosis not present

## 2021-02-18 DIAGNOSIS — I509 Heart failure, unspecified: Secondary | ICD-10-CM | POA: Diagnosis not present

## 2021-02-18 DIAGNOSIS — E039 Hypothyroidism, unspecified: Secondary | ICD-10-CM | POA: Diagnosis not present

## 2021-02-24 DIAGNOSIS — H2703 Aphakia, bilateral: Secondary | ICD-10-CM | POA: Diagnosis not present

## 2021-02-24 DIAGNOSIS — H26493 Other secondary cataract, bilateral: Secondary | ICD-10-CM | POA: Diagnosis not present

## 2021-02-25 DIAGNOSIS — N2889 Other specified disorders of kidney and ureter: Secondary | ICD-10-CM | POA: Diagnosis not present

## 2021-02-27 DIAGNOSIS — D649 Anemia, unspecified: Secondary | ICD-10-CM | POA: Diagnosis not present

## 2021-02-27 DIAGNOSIS — C649 Malignant neoplasm of unspecified kidney, except renal pelvis: Secondary | ICD-10-CM | POA: Diagnosis not present

## 2021-03-06 DIAGNOSIS — D649 Anemia, unspecified: Secondary | ICD-10-CM | POA: Diagnosis not present

## 2021-03-11 DIAGNOSIS — D4101 Neoplasm of uncertain behavior of right kidney: Secondary | ICD-10-CM | POA: Diagnosis not present

## 2021-03-11 DIAGNOSIS — D508 Other iron deficiency anemias: Secondary | ICD-10-CM | POA: Diagnosis not present

## 2021-03-13 DIAGNOSIS — D649 Anemia, unspecified: Secondary | ICD-10-CM | POA: Diagnosis not present

## 2021-04-02 DIAGNOSIS — H26491 Other secondary cataract, right eye: Secondary | ICD-10-CM | POA: Diagnosis not present

## 2021-04-04 DIAGNOSIS — R062 Wheezing: Secondary | ICD-10-CM | POA: Diagnosis not present

## 2021-04-24 DIAGNOSIS — U071 COVID-19: Secondary | ICD-10-CM | POA: Diagnosis not present

## 2021-05-08 DIAGNOSIS — E1122 Type 2 diabetes mellitus with diabetic chronic kidney disease: Secondary | ICD-10-CM | POA: Diagnosis not present

## 2021-05-08 DIAGNOSIS — C649 Malignant neoplasm of unspecified kidney, except renal pelvis: Secondary | ICD-10-CM | POA: Diagnosis not present

## 2021-05-08 DIAGNOSIS — D509 Iron deficiency anemia, unspecified: Secondary | ICD-10-CM | POA: Diagnosis not present

## 2021-06-03 DIAGNOSIS — H26492 Other secondary cataract, left eye: Secondary | ICD-10-CM | POA: Diagnosis not present

## 2021-06-10 DIAGNOSIS — R059 Cough, unspecified: Secondary | ICD-10-CM | POA: Diagnosis not present

## 2021-06-23 DIAGNOSIS — E039 Hypothyroidism, unspecified: Secondary | ICD-10-CM | POA: Diagnosis not present

## 2021-06-23 DIAGNOSIS — D509 Iron deficiency anemia, unspecified: Secondary | ICD-10-CM | POA: Diagnosis not present

## 2021-06-23 DIAGNOSIS — I2510 Atherosclerotic heart disease of native coronary artery without angina pectoris: Secondary | ICD-10-CM | POA: Diagnosis not present

## 2021-06-23 DIAGNOSIS — I129 Hypertensive chronic kidney disease with stage 1 through stage 4 chronic kidney disease, or unspecified chronic kidney disease: Secondary | ICD-10-CM | POA: Diagnosis not present

## 2021-07-07 DIAGNOSIS — R531 Weakness: Secondary | ICD-10-CM | POA: Diagnosis not present

## 2021-07-07 DIAGNOSIS — D509 Iron deficiency anemia, unspecified: Secondary | ICD-10-CM | POA: Diagnosis not present

## 2021-07-07 DIAGNOSIS — E039 Hypothyroidism, unspecified: Secondary | ICD-10-CM | POA: Diagnosis not present

## 2021-07-07 DIAGNOSIS — R251 Tremor, unspecified: Secondary | ICD-10-CM | POA: Diagnosis not present

## 2021-07-08 DIAGNOSIS — I4430 Unspecified atrioventricular block: Secondary | ICD-10-CM | POA: Diagnosis not present

## 2021-07-08 DIAGNOSIS — R4182 Altered mental status, unspecified: Secondary | ICD-10-CM | POA: Diagnosis not present

## 2021-07-08 DIAGNOSIS — R001 Bradycardia, unspecified: Secondary | ICD-10-CM | POA: Diagnosis not present

## 2021-07-08 DIAGNOSIS — I499 Cardiac arrhythmia, unspecified: Secondary | ICD-10-CM | POA: Diagnosis not present

## 2021-07-08 DIAGNOSIS — I959 Hypotension, unspecified: Secondary | ICD-10-CM | POA: Diagnosis not present

## 2021-07-08 DIAGNOSIS — Z743 Need for continuous supervision: Secondary | ICD-10-CM | POA: Diagnosis not present

## 2021-07-08 DIAGNOSIS — R531 Weakness: Secondary | ICD-10-CM | POA: Diagnosis not present

## 2021-07-08 DIAGNOSIS — J329 Chronic sinusitis, unspecified: Secondary | ICD-10-CM | POA: Diagnosis not present

## 2021-07-08 DIAGNOSIS — I440 Atrioventricular block, first degree: Secondary | ICD-10-CM | POA: Diagnosis not present

## 2021-07-08 DIAGNOSIS — R400 Somnolence: Secondary | ICD-10-CM | POA: Diagnosis not present

## 2021-07-16 DIAGNOSIS — Z79899 Other long term (current) drug therapy: Secondary | ICD-10-CM | POA: Diagnosis not present

## 2021-07-16 DIAGNOSIS — D509 Iron deficiency anemia, unspecified: Secondary | ICD-10-CM | POA: Diagnosis not present

## 2021-07-16 DIAGNOSIS — N189 Chronic kidney disease, unspecified: Secondary | ICD-10-CM | POA: Diagnosis not present

## 2021-08-26 DIAGNOSIS — E039 Hypothyroidism, unspecified: Secondary | ICD-10-CM | POA: Diagnosis not present

## 2021-09-12 DIAGNOSIS — N2889 Other specified disorders of kidney and ureter: Secondary | ICD-10-CM | POA: Diagnosis not present

## 2021-09-24 DIAGNOSIS — N2889 Other specified disorders of kidney and ureter: Secondary | ICD-10-CM | POA: Diagnosis not present

## 2021-10-07 DIAGNOSIS — E039 Hypothyroidism, unspecified: Secondary | ICD-10-CM | POA: Diagnosis not present

## 2021-10-16 DIAGNOSIS — D509 Iron deficiency anemia, unspecified: Secondary | ICD-10-CM | POA: Diagnosis not present

## 2021-11-27 DIAGNOSIS — D509 Iron deficiency anemia, unspecified: Secondary | ICD-10-CM | POA: Diagnosis not present

## 2022-01-01 DIAGNOSIS — D509 Iron deficiency anemia, unspecified: Secondary | ICD-10-CM | POA: Diagnosis not present

## 2022-01-01 DIAGNOSIS — C649 Malignant neoplasm of unspecified kidney, except renal pelvis: Secondary | ICD-10-CM | POA: Diagnosis not present

## 2022-01-01 DIAGNOSIS — D500 Iron deficiency anemia secondary to blood loss (chronic): Secondary | ICD-10-CM | POA: Diagnosis not present

## 2022-01-06 DIAGNOSIS — H04123 Dry eye syndrome of bilateral lacrimal glands: Secondary | ICD-10-CM | POA: Diagnosis not present

## 2022-01-06 DIAGNOSIS — H2703 Aphakia, bilateral: Secondary | ICD-10-CM | POA: Diagnosis not present

## 2022-01-14 DIAGNOSIS — D649 Anemia, unspecified: Secondary | ICD-10-CM | POA: Diagnosis not present

## 2022-01-15 DIAGNOSIS — I129 Hypertensive chronic kidney disease with stage 1 through stage 4 chronic kidney disease, or unspecified chronic kidney disease: Secondary | ICD-10-CM | POA: Diagnosis not present

## 2022-01-15 DIAGNOSIS — N1832 Chronic kidney disease, stage 3b: Secondary | ICD-10-CM | POA: Diagnosis not present

## 2022-01-15 DIAGNOSIS — E039 Hypothyroidism, unspecified: Secondary | ICD-10-CM | POA: Diagnosis not present

## 2022-01-15 DIAGNOSIS — E538 Deficiency of other specified B group vitamins: Secondary | ICD-10-CM | POA: Diagnosis not present

## 2022-01-21 DIAGNOSIS — D649 Anemia, unspecified: Secondary | ICD-10-CM | POA: Diagnosis not present

## 2022-01-27 DIAGNOSIS — H903 Sensorineural hearing loss, bilateral: Secondary | ICD-10-CM | POA: Diagnosis not present

## 2022-01-28 DIAGNOSIS — D649 Anemia, unspecified: Secondary | ICD-10-CM | POA: Diagnosis not present

## 2022-02-04 DIAGNOSIS — D649 Anemia, unspecified: Secondary | ICD-10-CM | POA: Diagnosis not present

## 2022-02-11 DIAGNOSIS — D649 Anemia, unspecified: Secondary | ICD-10-CM | POA: Diagnosis not present

## 2022-03-12 DIAGNOSIS — D500 Iron deficiency anemia secondary to blood loss (chronic): Secondary | ICD-10-CM | POA: Diagnosis not present

## 2022-03-12 DIAGNOSIS — C649 Malignant neoplasm of unspecified kidney, except renal pelvis: Secondary | ICD-10-CM | POA: Diagnosis not present

## 2022-11-12 ENCOUNTER — Emergency Department
Admission: EM | Admit: 2022-11-12 | Discharge: 2022-11-12 | Disposition: A | Discharge status: Home / Self Care | Payer: 59 | Attending: Emergency Medicine | Admitting: Emergency Medicine

## 2022-11-12 ENCOUNTER — Emergency Department: Payer: 59

## 2022-11-12 ENCOUNTER — Encounter: Payer: Self-pay | Admitting: Intensive Care

## 2022-11-12 ENCOUNTER — Other Ambulatory Visit: Payer: Self-pay

## 2022-11-12 DIAGNOSIS — S29001A Unspecified injury of muscle and tendon of front wall of thorax, initial encounter: Secondary | ICD-10-CM | POA: Diagnosis present

## 2022-11-12 DIAGNOSIS — W01198A Fall on same level from slipping, tripping and stumbling with subsequent striking against other object, initial encounter: Secondary | ICD-10-CM | POA: Diagnosis not present

## 2022-11-12 DIAGNOSIS — J9811 Atelectasis: Secondary | ICD-10-CM | POA: Insufficient documentation

## 2022-11-12 DIAGNOSIS — R918 Other nonspecific abnormal finding of lung field: Secondary | ICD-10-CM | POA: Insufficient documentation

## 2022-11-12 DIAGNOSIS — S2231XA Fracture of one rib, right side, initial encounter for closed fracture: Secondary | ICD-10-CM | POA: Insufficient documentation

## 2022-11-12 MED ORDER — LIDOCAINE 5 % EX PTCH: 1.0000 | MEDICATED_PATCH | Freq: Two times a day (BID) | CUTANEOUS | 0 refills | Status: DC

## 2022-11-12 MED ORDER — IPRATROPIUM-ALBUTEROL 0.5-2.5 (3) MG/3ML IN SOLN
3.0000 mL | Freq: Once | RESPIRATORY_TRACT | Status: AC
  Administered 2022-11-12: 3 mL via RESPIRATORY_TRACT
  Filled 2022-11-12: qty 3

## 2022-11-12 MED ORDER — OXYCODONE-ACETAMINOPHEN 5-325 MG PO TABS
1.0000 | ORAL_TABLET | Freq: Once | ORAL | Status: AC
  Administered 2022-11-12: 1 via ORAL
  Filled 2022-11-12: qty 1

## 2022-11-12 MED ORDER — PREDNISONE 20 MG PO TABS
40.0000 mg | ORAL_TABLET | Freq: Once | ORAL | Status: AC
  Administered 2022-11-12: 40 mg via ORAL
  Filled 2022-11-12: qty 2

## 2022-11-12 MED ORDER — AMOXICILLIN-POT CLAVULANATE 875-125 MG PO TABS
1.0000 | ORAL_TABLET | Freq: Once | ORAL | Status: AC
  Administered 2022-11-12: 1 via ORAL
  Filled 2022-11-12: qty 1

## 2022-11-12 MED ORDER — PREDNISONE 20 MG PO TABS: 40.0000 mg | ORAL_TABLET | Freq: Every day | ORAL | 0 refills | Status: AC

## 2022-11-12 MED ORDER — ONDANSETRON 4 MG PO TBDP
4.0000 mg | ORAL_TABLET | Freq: Once | ORAL | Status: AC
  Administered 2022-11-12: 4 mg via ORAL
  Filled 2022-11-12: qty 1

## 2022-11-12 MED ORDER — AMOXICILLIN-POT CLAVULANATE 875-125 MG PO TABS: 1.0000 | ORAL_TABLET | Freq: Two times a day (BID) | ORAL | 0 refills | Status: AC

## 2022-11-12 MED ORDER — OXYCODONE-ACETAMINOPHEN 5-325 MG PO TABS: 1.0000 | ORAL_TABLET | Freq: Four times a day (QID) | ORAL | 0 refills | Status: DC | PRN

## 2022-11-12 MED ORDER — ONDANSETRON 4 MG PO TBDP: 4.0000 mg | ORAL_TABLET | Freq: Three times a day (TID) | ORAL | 0 refills | Status: DC | PRN

## 2022-11-12 NOTE — Discharge Instructions (Signed)
For your rib fracture:  Take over-the-counter Ibuprofen and Tylenol for mild pain Take the oxycodone for severe pain Do not take more than 4000 mg of tylenol per day  I'd recommend Tylenol 500 mg every 8 hours with the Percocet as needed.  You can apply lidocaine patches to the area of pain as well to help with pain  Take the antibiotics to prevent and treat pneumonia  Follow-up with your regular doctor in 2-3 days for repeat exam and check-up

## 2022-11-12 NOTE — ED Triage Notes (Signed)
Arrived from [facility]. Patient c/o right sided rib cage pain. Reports hitting right rib cage on dryer a few days ago

## 2022-11-12 NOTE — ED Provider Notes (Signed)
[facility] Provider Note    Event Date/Time   First MD Initiated Contact with Patient 11/12/22 1109     (approximate)   History   Rib Injury   HPI  Karla Alexander is a 89 y.o. female  here with rib pain. Pt reports that several days ago she fell towards a dryer while doing laundry. Did not actually hit anything but had to catch herself, felt a popping sensation in her R chest. Since then has had a sharp, stabbing pain. Worse with movement, inspiration. No SOB. No cough. No fevers. No other injuries.       Physical Exam   Triage Vital Signs: ED Triage Vitals [11/12/22 0959]  Encounter Vitals Group     BP (!) 132/52     Systolic BP Percentile      Diastolic BP Percentile      Pulse Rate (!) 55     Resp 18     Temp (!) 89.4 F (31.9 C)     Temp Source Oral     SpO2 95 %     Weight 130 lb (59 kg)     Height 5' (1.524 m)     Head Circumference      Peak Flow      Pain Score 5     Pain Loc      Pain Education      Exclude from Growth Chart     Most recent vital signs: Vitals:   11/12/22 0959 11/12/22 1013  BP: (!) 132/52   Pulse: (!) 55   Resp: 18   Temp: (!) 89.4 F (31.9 C) 98.4 F (36.9 C)  SpO2: 95%      General: Awake, no distress.  CV:  Good peripheral perfusion. RRR. Resp:  Normal work of breathing. Lungs CTAB. No w/r/r. Speaking in full sentences. Mild chest wall TTP over R anterolateral chest. Abd:  No distention. No tenderness. No RUQ pain. No rebound or guarding. Other:  No LE edema.   ED Results / Procedures / Treatments   Labs (all labs ordered are listed, but only abnormal results are displayed) Labs Reviewed - No data to display   EKG    RADIOLOGY CXR: Minimally displaced R seventh rib fx, small effusion and likely atelectasis   I also independently reviewed and agree with radiologist interpretations.   PROCEDURES:  Critical Care performed: No   MEDICATIONS ORDERED IN ED: Medications   oxyCODONE-acetaminophen (PERCOCET/ROXICET) 5-325 MG per tablet 1 tablet (1 tablet Oral Given 11/12/22 1136)  ondansetron (ZOFRAN-ODT) disintegrating tablet 4 mg (4 mg Oral Given 11/12/22 1138)  amoxicillin-clavulanate (AUGMENTIN) 875-125 MG per tablet 1 tablet (1 tablet Oral Given 11/12/22 1137)  predniSONE (DELTASONE) tablet 40 mg (40 mg Oral Given 11/12/22 1205)  ipratropium-albuterol (DUONEB) 0.5-2.5 (3) MG/3ML nebulizer solution 3 mL (3 mLs Nebulization Given 11/12/22 1138)     IMPRESSION / MDM / ASSESSMENT AND PLAN / ED COURSE  I reviewed the triage vital signs and the nursing notes.                              Differential diagnosis includes, but is not limited to, rib fracture, ptx, htx, pneumonia, pleural effusion, other chest wall pain  Patient's presentation is most consistent with acute presentation with potential threat to life or bodily function.  89 yo F here with R sided rib pain after fall.  Imaging shows minimally displaced rib  fx with no PTX. She has a small effusion/atelectasis. THe injury occurred days ago, do not suspect acute hemothorax or bleed. She is afebrile, well-appearing, has no hypoxia, SOB, or sputum production, and I have a low concern for superimposed PNA though given her age, I do think it's reasonable to empirically tx in the event of this. She feels better with analgesia here and is ambulatory w/o difficulty or hypoxia. Will provide her IS, pain meds, antibiotics and good return precautions. She has home health/help at home who can monitor her sx and she will f/u with PCP as outpt. She and guardian feel comfortable with this plan at this time and are aware of return precautions in the event of worsening pain or superimposed pulmonary issues.   FINAL CLINICAL IMPRESSION(S) / ED DIAGNOSES   Final diagnoses:  Closed fracture of one rib of right side, initial encounter     Rx / DC Orders   ED Discharge Orders          Ordered    oxyCODONE-acetaminophen  (PERCOCET) 5-325 MG tablet  Every 6 hours PRN        11/12/22 1224    amoxicillin-clavulanate (AUGMENTIN) 875-125 MG tablet  2 times daily        11/12/22 1224    predniSONE (DELTASONE) 20 MG tablet  Daily        11/12/22 1224    ondansetron (ZOFRAN-ODT) 4 MG disintegrating tablet  Every 8 hours PRN        11/12/22 1224    lidocaine (LIDODERM) 5 %  Every 12 hours        11/12/22 1225             Note:  This document was prepared using Dragon voice recognition software and may include unintentional dictation errors.   Shaune Pollack, MD 11/12/22 [phone]

## 2023-02-23 ENCOUNTER — Inpatient Hospital Stay: Payer: 59

## 2023-02-23 ENCOUNTER — Inpatient Hospital Stay: Payer: 59 | Attending: Internal Medicine | Admitting: Internal Medicine

## 2023-02-23 ENCOUNTER — Encounter: Payer: Self-pay | Admitting: Internal Medicine

## 2023-02-23 VITALS — BP 117/56 | HR 55 | Temp 97.0°F | Wt 144.0 lb

## 2023-02-23 DIAGNOSIS — R911 Solitary pulmonary nodule: Secondary | ICD-10-CM | POA: Insufficient documentation

## 2023-02-23 DIAGNOSIS — F0393 Unspecified dementia, unspecified severity, with mood disturbance: Secondary | ICD-10-CM | POA: Diagnosis not present

## 2023-02-23 DIAGNOSIS — E785 Hyperlipidemia, unspecified: Secondary | ICD-10-CM | POA: Insufficient documentation

## 2023-02-23 DIAGNOSIS — I110 Hypertensive heart disease with heart failure: Secondary | ICD-10-CM | POA: Diagnosis not present

## 2023-02-23 DIAGNOSIS — K219 Gastro-esophageal reflux disease without esophagitis: Secondary | ICD-10-CM | POA: Insufficient documentation

## 2023-02-23 DIAGNOSIS — N2889 Other specified disorders of kidney and ureter: Secondary | ICD-10-CM

## 2023-02-23 DIAGNOSIS — I2510 Atherosclerotic heart disease of native coronary artery without angina pectoris: Secondary | ICD-10-CM | POA: Insufficient documentation

## 2023-02-23 DIAGNOSIS — F32A Depression, unspecified: Secondary | ICD-10-CM | POA: Diagnosis not present

## 2023-02-23 DIAGNOSIS — R109 Unspecified abdominal pain: Secondary | ICD-10-CM | POA: Diagnosis not present

## 2023-02-23 DIAGNOSIS — M47816 Spondylosis without myelopathy or radiculopathy, lumbar region: Secondary | ICD-10-CM | POA: Insufficient documentation

## 2023-02-23 DIAGNOSIS — E039 Hypothyroidism, unspecified: Secondary | ICD-10-CM | POA: Diagnosis not present

## 2023-02-23 DIAGNOSIS — I10 Essential (primary) hypertension: Secondary | ICD-10-CM | POA: Diagnosis not present

## 2023-02-23 DIAGNOSIS — G8929 Other chronic pain: Secondary | ICD-10-CM | POA: Insufficient documentation

## 2023-02-23 LAB — COMPREHENSIVE METABOLIC PANEL
ALT: 14 U/L (ref 0–44)
AST: 18 U/L (ref 15–41)
Albumin: 4 g/dL (ref 3.5–5.0)
Alkaline Phosphatase: 70 U/L (ref 38–126)
Anion gap: 8 (ref 5–15)
BUN: 18 mg/dL (ref 8–23)
CO2: 26 mmol/L (ref 22–32)
Calcium: 9.3 mg/dL (ref 8.9–10.3)
Chloride: 93 mmol/L — ABNORMAL LOW (ref 98–111)
Creatinine, Ser: 1.27 mg/dL — ABNORMAL HIGH (ref 0.44–1.00)
GFR, Estimated: 41 mL/min — ABNORMAL LOW (ref >60)
Glucose, Bld: 99 mg/dL (ref 70–99)
Potassium: 4.3 mmol/L (ref 3.5–5.1)
Sodium: 127 mmol/L — ABNORMAL LOW (ref 135–145)
Total Bilirubin: 0.7 mg/dL (ref <1.2)
Total Protein: 6.9 g/dL (ref 6.5–8.1)

## 2023-02-23 LAB — CBC WITH DIFFERENTIAL/PLATELET
Abs Immature Granulocytes: 0.04 10*3/uL (ref 0.00–0.07)
Basophils Absolute: 0 10*3/uL (ref 0.0–0.1)
Basophils Relative: 1 %
Eosinophils Absolute: 0.1 10*3/uL (ref 0.0–0.5)
Eosinophils Relative: 2 %
HCT: 46.3 % — ABNORMAL HIGH (ref 36.0–46.0)
Hemoglobin: 15.2 g/dL — ABNORMAL HIGH (ref 12.0–15.0)
Immature Granulocytes: 1 %
Lymphocytes Relative: 19 %
Lymphs Abs: 1.2 10*3/uL (ref 0.7–4.0)
MCH: 31.5 pg (ref 26.0–34.0)
MCHC: 32.8 g/dL (ref 30.0–36.0)
MCV: 96.1 fL (ref 80.0–100.0)
Monocytes Absolute: 0.4 10*3/uL (ref 0.1–1.0)
Monocytes Relative: 7 %
Neutro Abs: 4.5 10*3/uL (ref 1.7–7.7)
Neutrophils Relative %: 70 %
Platelets: 239 10*3/uL (ref 150–400)
RBC: 4.82 MIL/uL (ref 3.87–5.11)
RDW: 12.3 % (ref 11.5–15.5)
WBC: 6.2 10*3/uL (ref 4.0–10.5)
nRBC: 0 % (ref 0.0–0.2)

## 2023-02-23 NOTE — Progress Notes (Signed)
[facility] CONSULT NOTE  Patient Care Team: Koren Bound, NP as PCP - General (Nurse Practitioner) Michaelyn Barter, MD as Consulting Physician (Oncology)  REFERRING PROVIDER: Koren Bound, NP  REASON FOR REFFERAL: right renal mass   CANCER STAGING   Cancer Staging  No matching staging information was found for the patient.   ASSESSMENT & PLAN:  Karla Alexander 89 y.o. female with pmh of dementia, depression, GERD, hyperlipidemia, hypertension, hypothyroidism, CAD referred to medical oncology for right renal mass.  # Right renal mass -Known since May 2019.  Previously followed with Dr. Apolinar Junes and then with Dr. Noel Christmas, urologist at [facility].  Last imaging from 09/12/2021 showed right inferior pole mass measuring 7 x 6 x 7.6 cm previously measuring 7.2 x 5.9 x 7.1 cm from 11/20/2020. Plan was to repeat imaging in 1 year but then patient moved to [place].  She previously had discussions with urologist about surgery versus surveillance and opted for surveillance.  -She reestablished care with primary care after moving to [facility].  Complained of right sided abdominal pain.  Ultrasound of the abdomen showed 8 cm right renal mass and patient was referred to medical oncology due to concern for new finding.  Charts were reviewed from [facility] health and Dr. Delana Meyer note.  Will obtain updated CT chest abdomen and pelvis with and without contrast.  Referral to urology to reestablish care.  Labs today.  I will follow-up with her in 4 weeks.  Patient is a poor historian.  Has history of dementia.  Has legal guardianship.  Functional status is poor.  Orders Placed This Encounter  Procedures   CT ABDOMEN PELVIS W WO CONTRAST    Standing Status:   Future    Standing Expiration Date:   02/23/2024    Order Specific Question:   If indicated for the ordered procedure, I authorize the administration of contrast media per Radiology protocol    Answer:    Yes    Order Specific Question:   Does the patient have a contrast media/X-ray dye allergy?    Answer:   No    Order Specific Question:   Preferred imaging location?    Answer:   [facility]    Order Specific Question:   If indicated for the ordered procedure, I authorize the administration of oral contrast media per Radiology protocol    Answer:   Yes   CT Chest W Contrast    Standing Status:   Future    Standing Expiration Date:   02/23/2024    Order Specific Question:   If indicated for the ordered procedure, I authorize the administration of contrast media per Radiology protocol    Answer:   Yes    Order Specific Question:   Does the patient have a contrast media/X-ray dye allergy?    Answer:   No    Order Specific Question:   Preferred imaging location?    Answer:   [facility]   CBC with Differential/Platelet    Standing Status:   Future    Number of Occurrences:   1    Standing Expiration Date:   02/23/2024   Comprehensive metabolic panel    Standing Status:   Future    Number of Occurrences:   1    Standing Expiration Date:   02/23/2024   Ambulatory referral to Urology    Referral Priority:   Routine    Referral Type:   Consultation    Referral  Reason:   Specialty Services Required    Requested Specialty:   Urology    Number of Visits Requested:   1   RTC in 4 weeks for MD visit.  To discuss scans.  The total time spent in the appointment was 60 minutes encounter with patients including review of chart and various tests results, discussions about plan of care and coordination of care plan   All questions were answered. The patient knows to call the clinic with any problems, questions or concerns. No barriers to learning was detected.  Michaelyn Barter, MD 12/3/202411:53 AM   HISTORY OF PRESENTING ILLNESS:  Karla Alexander 89 y.o. female with pmh of dementia, depression, GERD, hyperlipidemia, hypertension, hypothyroidism, CAD referred to medical oncology for  right renal mass.  Patient has known right kidney inferior pole mass since May 2019.  She was previously following with Dr. Apolinar Junes.  CT abdomen from 02/04/2018 showed 6.2 x 4.9 x 5.3 cm enhancing right lower pole renal mass compatible with RCC.  Had discussion about surgery versus surveillance.  Patient opted for surveillance.  She then moved and established care with Dr. Noel Christmas, urologist at [facility].  Last imaging from 09/12/2021 showed right inferior pole mass measuring 7 x 6 x 7.6 cm previously measuring 7.2 x 5.9 x 7.1 cm from 11/20/2020.  Patient continued to opt for surveillance.  Plan was to repeat imaging in 1 year but then patient moved to [place].  She reestablished care with primary care.  Complained of right sided abdominal pain.  Ultrasound of the abdomen showed 8 cm right renal mass and patient was referred to medical oncology.  Patient is a poor historian.  Accompanied with her caregiver.  Has history of dementia.  Has legal guardianship.  Reports some lower back pain.  I have reviewed her chart and materials related to her cancer extensively and collaborated history with the patient. Summary of oncologic history is as follows: Oncology History   No history exists.    MEDICAL HISTORY:  Past Medical History:  Diagnosis Date   Arthritis    Constipation    Coronary artery disease    non-obstructive CAD 04/2013   Dementia (HCC)    Depression    GERD (gastroesophageal reflux disease)    Hyperlipemia    Hypertension    dr Trish Mage   [place]   Hypothyroidism    Seasonal allergies    Shortness of breath     SURGICAL HISTORY: Past Surgical History:  Procedure Laterality Date   BACK SURGERY     CARDIAC CATHETERIZATION  04/24/2013   20% mLAD, 20% OM1, 30% #1/60% #2 pRCA [facility]).  EF 68% by stress test 04/12/13.    MAXIMUM ACCESS (MAS)POSTERIOR LUMBAR INTERBODY FUSION (PLIF) 2 LEVEL N/A 05/31/2013   Procedure: FOR MAXIMUM ACCESS (MAS) POSTERIOR  LUMBAR INTERBODY FUSION (PLIF) Lumbar Four-Five, Lumbar Five-Sacral One;  Surgeon: Tia Alert, MD;  Location: [facility];  Service: Neurosurgery;  Laterality: N/A;  FOR MAXIMUM ACCESS (MAS) POSTERIOR LUMBAR INTERBODY FUSION (PLIF) Lumbar Four-Five, Lumbar Five-Sacral One   REVERSE SHOULDER ARTHROPLASTY Left 08/23/2014   Procedure: REVERSE SHOULDER ARTHROPLASTY WITH APPLICATION OF CABLES ;  Surgeon: Jones Broom, MD;  Location: [facility] OR;  Service: Orthopedics;  Laterality: Left;  Left reverse total shoulder arthroplasty    SOCIAL HISTORY: Social History   Socioeconomic History   Marital status: Legally Separated    Spouse name: Not on file   Number of children: Not on file  Years of education: Not on file   Highest education level: Not on file  Occupational History   Not on file  Tobacco Use   Smoking status: Never   Smokeless tobacco: Never  Vaping Use   Vaping status: Never Used  Substance and Sexual Activity   Alcohol use: No   Drug use: No   Sexual activity: Not on file  Other Topics Concern   Not on file  Social History Narrative   Not on file   Social Determinants of Health   Financial Resource Strain: Not on file  Food Insecurity: No Food Insecurity (02/23/2023)   Hunger Vital Sign    Worried About Running Out of Food in the Last Year: Never true    Ran Out of Food in the Last Year: Never true  Transportation Needs: No Transportation Needs (02/23/2023)   PRAPARE - Administrator, Civil Service (Medical): No    Lack of Transportation (Non-Medical): No  Physical Activity: Not on file  Stress: Not on file  Social Connections: Not on file  Intimate Partner Violence: Not At Risk (02/23/2023)   Humiliation, Afraid, Rape, and Kick questionnaire    Fear of Current or Ex-Partner: No    Emotionally Abused: No    Physically Abused: No    Sexually Abused: No    FAMILY HISTORY: Family History  Problem Relation Age of Onset   Hypertension Other      ALLERGIES:  has No Known Allergies.  MEDICATIONS:  Current Outpatient Medications  Medication Sig Dispense Refill   acetaminophen (TYLENOL) 650 MG CR tablet Take 650 mg by mouth every 4 (four) hours as needed for pain.     alendronate (FOSAMAX) 70 MG tablet Take 70 mg by mouth every Monday.      amLODipine (NORVASC) 5 MG tablet SMARTSIG:1.0 Tablet(s) By Mouth Daily     Calcium Citrate-Vitamin D 250-200 MG-UNIT TABS Take 1 tablet by mouth 2 (two) times daily.     carboxymethylcellulose (REFRESH TEARS) 0.5 % SOLN Apply to eye.     diclofenac sodium (VOLTAREN) 1 % GEL      divalproex (DEPAKOTE SPRINKLE) 125 MG capsule      donepezil (ARICEPT) 10 MG tablet SMARTSIG:1.0 Tablet(s) By Mouth Daily     ferrous sulfate 325 (65 FE) MG EC tablet 1 tablet (325 mg total) 3 (three) times a week.     fluticasone (FLONASE) 50 MCG/ACT nasal spray      gabapentin (NEURONTIN) 100 MG capsule Take by mouth.     isosorbide mononitrate (ISMO) 10 MG tablet SMARTSIG:1.0 Tablet(s) By Mouth Twice Daily     levothyroxine (SYNTHROID, LEVOTHROID) 100 MCG tablet      lidocaine (LIDODERM) 5 % Place 1 patch onto the skin every 12 (twelve) hours. Remove & Discard patch within 12 hours or as directed by MD 10 patch 0   lovastatin (MEVACOR) 20 MG tablet Take 20 mg by mouth every evening.      metoprolol tartrate (LOPRESSOR) 25 MG tablet Take by mouth.     ondansetron (ZOFRAN-ODT) 4 MG disintegrating tablet Take 1 tablet (4 mg total) by mouth every 8 (eight) hours as needed for nausea or vomiting. 20 tablet 0   oxyCODONE-acetaminophen (ROXICET) 5-325 MG per tablet Take 1-2 tablets by mouth every 4 (four) hours as needed for severe pain. (Patient taking differently: Take 1 tablet by mouth every 4 (four) hours as needed for severe pain (pain score 7-10).) 60 tablet 0   polyethylene glycol (MIRALAX /  GLYCOLAX) packet Take 17 g by mouth daily.     potassium chloride SA (K-DUR,KLOR-CON) 20 MEQ tablet      pramipexole (MIRAPEX)  0.125 MG tablet      QUEtiapine (SEROQUEL) 25 MG tablet Take 0.5 tablets (12.5 mg total) by mouth at bedtime. 30 tablet 0   ranitidine (ZANTAC) 150 MG tablet Take 150 mg by mouth daily.     traMADol (ULTRAM) 50 MG tablet      triamterene-hydrochlorothiazide (DYAZIDE) 37.5-25 MG per capsule Take 1 capsule by mouth daily.     atorvastatin (LIPITOR) 20 MG tablet  (Patient not taking: Reported on 02/23/2023)     oxyCODONE-acetaminophen (PERCOCET) 5-325 MG tablet Take 1 tablet by mouth every 6 (six) hours as needed for severe pain. (Patient not taking: Reported on 02/23/2023) 12 tablet 0   No current facility-administered medications for this visit.    REVIEW OF SYSTEMS:   Pertinent information mentioned in HPI All other systems were reviewed with the patient and are negative.  PHYSICAL EXAMINATION: ECOG PERFORMANCE STATUS: 2 - Symptomatic, <50% confined to bed  Vitals:   02/23/23 1058  BP: (!) 117/56  Pulse: (!) 55  Temp: (!) 97 F (36.1 C)  SpO2: 96%   Filed Weights   02/23/23 1058  Weight: 144 lb (65.3 kg)    GENERAL:alert, no distress and comfortable SKIN: skin color, texture, turgor are normal, no rashes or significant lesions EYES: normal, conjunctiva are pink and non-injected, sclera clear OROPHARYNX:no exudate, no erythema and lips, buccal mucosa, and tongue normal  NECK: supple, thyroid normal size, non-tender, without nodularity LYMPH:  no palpable lymphadenopathy in the cervical, axillary or inguinal LUNGS: clear to auscultation and percussion with normal breathing effort HEART: regular rate & rhythm and no murmurs and no lower extremity edema ABDOMEN:abdomen soft, non-tender and normal bowel sounds Musculoskeletal:no cyanosis of digits and no clubbing  PSYCH: alert & oriented x 3 with fluent speech NEURO: no focal motor/sensory deficits  LABORATORY DATA:  I have reviewed the data as listed Lab Results  Component Value Date   WBC 8.5 10/11/2015   HGB 12.7  10/11/2015   HCT 36.6 10/11/2015   MCV 92.4 10/11/2015   PLT 143 (L) 10/11/2015   No results for input(s): "NA", "K", "CL", "CO2", "GLUCOSE", "BUN", "CREATININE", "CALCIUM", "GFRNONAA", "GFRAA", "PROT", "ALBUMIN", "AST", "ALT", "ALKPHOS", "BILITOT", "BILIDIR", "IBILI" in the last 8760 hours.  RADIOGRAPHIC STUDIES: I have personally reviewed the radiological images as listed and agreed with the findings in the report. No results found.

## 2023-02-23 NOTE — Progress Notes (Signed)
Patient is having some nausea, and she is more tired and weak than usual. Her caretaker is with her. Patient is unsure on a lot of the questions that I had to ask her.

## 2023-03-04 ENCOUNTER — Ambulatory Visit: Payer: 59 | Admitting: Urology

## 2023-03-04 ENCOUNTER — Telehealth: Payer: Self-pay | Admitting: Urology

## 2023-03-04 ENCOUNTER — Encounter: Payer: Self-pay | Admitting: Urology

## 2023-03-04 VITALS — BP 112/63 | HR 64 | Ht 60.0 in | Wt 144.0 lb

## 2023-03-04 DIAGNOSIS — N2889 Other specified disorders of kidney and ureter: Secondary | ICD-10-CM

## 2023-03-04 NOTE — Progress Notes (Signed)
03/04/23 2:15 PM   [place] B Johnston 12/13/1932 [id number]  CC: Right renal mass  HPI: 89 year old female with a number of comorbidities including CAD, COPD, Alzheimer's dementia who has been on active surveillance for a large right renal mass for at least 5 years.  She was last seen by Dr. Apolinar Junes in November 2019 and opted to continue active surveillance at that time for her 6.2 cm renal mass based on her other core morbidities and dementia.  She then was relocated to a different assisted living facility and was followed by different urologist, Dr. Heron Nay, with [facility] for ongoing active surveillance.  Her last cross-sectional imaging was June 2023 with a CT that shows stable right sided renal mass measuring 7.5 cm.  She was also referred to oncology recently, and they ordered repeat staging imaging and is scheduled for 03/09/2023.    She is a very challenging historian secondary to her severe dementia, but no urinary complaints today or flank pain.   PMH: Past Medical History:  Diagnosis Date   Arthritis    Constipation    Coronary artery disease    non-obstructive CAD 04/2013   Dementia (HCC)    Depression    GERD (gastroesophageal reflux disease)    Hyperlipemia    Hypertension    dr Trish Mage   [place]   Hypothyroidism    Seasonal allergies    Shortness of breath     Surgical History: Past Surgical History:  Procedure Laterality Date   BACK SURGERY     CARDIAC CATHETERIZATION  04/24/2013   20% mLAD, 20% OM1, 30% #1/60% #2 pRCA [facility]).  EF 68% by stress test 04/12/13.    MAXIMUM ACCESS (MAS)POSTERIOR LUMBAR INTERBODY FUSION (PLIF) 2 LEVEL N/A 05/31/2013   Procedure: FOR MAXIMUM ACCESS (MAS) POSTERIOR LUMBAR INTERBODY FUSION (PLIF) Lumbar Four-Five, Lumbar Five-Sacral One;  Surgeon: Tia Alert, MD;  Location: [facility];  Service: Neurosurgery;  Laterality: N/A;  FOR MAXIMUM ACCESS (MAS) POSTERIOR LUMBAR INTERBODY FUSION (PLIF) Lumbar  Four-Five, Lumbar Five-Sacral One   REVERSE SHOULDER ARTHROPLASTY Left 08/23/2014   Procedure: REVERSE SHOULDER ARTHROPLASTY WITH APPLICATION OF CABLES ;  Surgeon: Jones Broom, MD;  Location: [facility] OR;  Service: Orthopedics;  Laterality: Left;  Left reverse total shoulder arthroplasty     Family History: Family History  Problem Relation Age of Onset   Hypertension Other     Social History:  reports that she has never smoked. She has never been exposed to tobacco smoke. She has never used smokeless tobacco. She reports that she does not drink alcohol and does not use drugs.  Physical Exam: BP 112/63   Pulse 64   Ht 5' (1.524 m)   Wt 144 lb (65.3 kg)   BMI 28.12 kg/m    Constitutional: Pleasantly confused Cardiovascular: No clubbing, cyanosis, or edema. Respiratory: Normal respiratory effort, no increased work of breathing. GI: Abdomen is soft, nontender, nondistended, no abdominal masses   Laboratory Data: 02/23/2023: Creatinine 1.27, EGFR 41  Pertinent Imaging: I have personally viewed and interpreted the prior cross-sectional imaging showing a large right renal mass consistent with RCC.  Assessment & Plan:   89 year old comorbid female with history of CAD, COPD, and severe dementia who was had a relatively stable large 7 cm right renal mass consistent with RCC on active surveillance since at least 2019.  Minimal change in the size of the mass since that time, she was also seen by oncology who ordered repeat imaging which is  scheduled for 03/09/2023.  With her comorbidities as well as worsening renal function, I strongly recommended continuing active surveillance.  The lesion has grown only approximately 1 cm in the last 5 years.  Will contact with repeat staging imaging results, anticipate continuing active surveillance RTC with Dr. Apolinar Junes in 1 year CT prior   Legrand Rams, MD 03/04/2023  [facility] [address] [place], [place]  [address] [phone]

## 2023-03-04 NOTE — Telephone Encounter (Signed)
Patient was seen in office today and signed AOB. After checking patient chart, it was discovered that she has a legal guardian Ronnette Hila). I called him to get consent for office to see and treat patient, and he verbally gave consent.

## 2023-03-09 ENCOUNTER — Ambulatory Visit
Admission: RE | Admit: 2023-03-09 | Discharge: 2023-03-09 | Disposition: A | Discharge status: Home / Self Care | Payer: 59 | Source: Ambulatory Visit | Attending: Internal Medicine | Admitting: Internal Medicine

## 2023-03-09 DIAGNOSIS — N2889 Other specified disorders of kidney and ureter: Secondary | ICD-10-CM | POA: Diagnosis present

## 2023-03-09 DIAGNOSIS — R918 Other nonspecific abnormal finding of lung field: Secondary | ICD-10-CM | POA: Insufficient documentation

## 2023-03-09 DIAGNOSIS — R590 Localized enlarged lymph nodes: Secondary | ICD-10-CM | POA: Diagnosis not present

## 2023-03-09 MED ORDER — IOHEXOL 300 MG/ML  SOLN
80.0000 mL | Freq: Once | INTRAMUSCULAR | Status: AC | PRN
  Administered 2023-03-09: 80 mL via INTRAVENOUS

## 2023-03-10 ENCOUNTER — Other Ambulatory Visit: Payer: Self-pay

## 2023-03-10 ENCOUNTER — Emergency Department
Admission: EM | Admit: 2023-03-10 | Discharge: 2023-03-11 | Disposition: A | Discharge status: Home / Self Care | Payer: 59 | Attending: Emergency Medicine | Admitting: Emergency Medicine

## 2023-03-10 ENCOUNTER — Emergency Department: Payer: 59

## 2023-03-10 DIAGNOSIS — M25521 Pain in right elbow: Secondary | ICD-10-CM | POA: Insufficient documentation

## 2023-03-10 DIAGNOSIS — W19XXXA Unspecified fall, initial encounter: Secondary | ICD-10-CM

## 2023-03-10 DIAGNOSIS — Z79899 Other long term (current) drug therapy: Secondary | ICD-10-CM | POA: Diagnosis not present

## 2023-03-10 DIAGNOSIS — S0990XA Unspecified injury of head, initial encounter: Secondary | ICD-10-CM | POA: Diagnosis not present

## 2023-03-10 DIAGNOSIS — M4312 Spondylolisthesis, cervical region: Secondary | ICD-10-CM | POA: Insufficient documentation

## 2023-03-10 DIAGNOSIS — W010XXA Fall on same level from slipping, tripping and stumbling without subsequent striking against object, initial encounter: Secondary | ICD-10-CM | POA: Diagnosis not present

## 2023-03-10 DIAGNOSIS — S52021A Displaced fracture of olecranon process without intraarticular extension of right ulna, initial encounter for closed fracture: Secondary | ICD-10-CM | POA: Insufficient documentation

## 2023-03-10 DIAGNOSIS — R9082 White matter disease, unspecified: Secondary | ICD-10-CM | POA: Diagnosis not present

## 2023-03-10 DIAGNOSIS — S59901A Unspecified injury of right elbow, initial encounter: Secondary | ICD-10-CM | POA: Diagnosis present

## 2023-03-10 MED ORDER — ACETAMINOPHEN 500 MG PO TABS
1000.0000 mg | ORAL_TABLET | Freq: Once | ORAL | Status: AC
  Administered 2023-03-10: 1000 mg via ORAL
  Filled 2023-03-10: qty 2

## 2023-03-10 NOTE — ED Notes (Signed)
[facility] Social Worker for patient    [facility])   [phone]

## 2023-03-10 NOTE — ED Triage Notes (Signed)
Pt comes via EMS from assisted living place. Info in chart. Pt had trip and fall. Pt has swelling to right elbow. Pt states 5/10 pain.

## 2023-03-10 NOTE — ED Provider Notes (Signed)
[place] EMERGENCY DEPARTMENT AT [facility] Provider Note   CSN: [id number] Arrival date & time: 03/10/23  1722     History  Chief Complaint  Patient presents with   Fall    Karla Alexander is a 89 y.o. female.  Presents to the emergency department for evaluation of a fall.  She tripped earlier today and landed on her right elbow and is uncertain if she hit her head or loss consciousness but thinks she may be hit the right side of her head.  She denies any dizziness or lightheadedness prior to fall.  Patient states she tripped.  She denies any current headache, chest pain, shortness of breath, neck pain numbness tingling or radicular symptoms.  She denies any lower back pain hip pain.  She does complain of moderate right elbow pain with swelling to the elbow.  No wrist or shoulder pain.  She has not had medications for pain.  She denies any numbness or tingling in right upper extremity.  HPI     Home Medications Prior to Admission medications   Medication Sig Start Date End Date Taking? Authorizing Provider  acetaminophen (TYLENOL) 650 MG CR tablet Take 650 mg by mouth every 4 (four) hours as needed for pain.    [provider]  alendronate (FOSAMAX) 70 MG tablet Take 70 mg by mouth every Monday.     [provider]  amLODipine (NORVASC) 5 MG tablet SMARTSIG:1.0 Tablet(s) By Mouth Daily    [provider]  atorvastatin (LIPITOR) 20 MG tablet  08/26/17   [provider]  Calcium Citrate-Vitamin D 250-200 MG-UNIT TABS Take 1 tablet by mouth 2 (two) times daily.    [provider]  carboxymethylcellulose (REFRESH TEARS) 0.5 % SOLN Apply to eye. 05/01/22   [provider]  diclofenac sodium (VOLTAREN) 1 % GEL  06/29/17   [provider]  divalproex (DEPAKOTE SPRINKLE) 125 MG capsule  10/06/17   [provider]  donepezil (ARICEPT) 10 MG tablet SMARTSIG:1.0 Tablet(s) By Mouth Daily    [provider]   ferrous sulfate 325 (65 FE) MG EC tablet 1 tablet (325 mg total) 3 (three) times a week. 04/18/21   [provider]  fluticasone Aleda Grana) 50 MCG/ACT nasal spray  08/23/17   [provider]  gabapentin (NEURONTIN) 100 MG capsule Take by mouth.    [provider]  isosorbide mononitrate (ISMO) 10 MG tablet SMARTSIG:1.0 Tablet(s) By Mouth Twice Daily 02/19/23   [provider]  levothyroxine (SYNTHROID, LEVOTHROID) 100 MCG tablet  10/01/17   [provider]  lidocaine (LIDODERM) 5 % Place 1 patch onto the skin every 12 (twelve) hours. Remove & Discard patch within 12 hours or as directed by MD 11/12/22 11/12/23  Shaune Pollack, MD  lovastatin (MEVACOR) 20 MG tablet Take 20 mg by mouth every evening.     [provider]  metoprolol tartrate (LOPRESSOR) 25 MG tablet Take by mouth.    [provider]  ondansetron (ZOFRAN-ODT) 4 MG disintegrating tablet Take 1 tablet (4 mg total) by mouth every 8 (eight) hours as needed for nausea or vomiting. 11/12/22   Shaune Pollack, MD  oxyCODONE-acetaminophen (PERCOCET) 5-325 MG tablet Take 1 tablet by mouth every 6 (six) hours as needed for severe pain. 11/12/22 11/12/23  Shaune Pollack, MD  oxyCODONE-acetaminophen (ROXICET) 5-325 MG per tablet Take 1-2 tablets by mouth every 4 (four) hours as needed for severe pain. 08/24/14   Jiles Harold, PA-C  polyethylene glycol [facility] /  GLYCOLAX) packet Take 17 g by mouth daily.    [provider]  potassium chloride SA (K-DUR,KLOR-CON) 20 MEQ tablet  09/13/17   [provider]  pramipexole (MIRAPEX) 0.125 MG tablet  09/02/17   [provider]  QUEtiapine (SEROQUEL) 25 MG tablet Take 0.5 tablets (12.5 mg total) by mouth at bedtime. 08/09/15   Joseph Art, DO  ranitidine (ZANTAC) 150 MG tablet Take 150 mg by mouth daily.    [provider]  traMADol Janean Sark) 50 MG tablet  09/06/17   [provider]   triamterene-hydrochlorothiazide (DYAZIDE) 37.5-25 MG per capsule Take 1 capsule by mouth daily.    [provider]      Allergies    Patient has no known allergies.    Review of Systems   Review of Systems  Physical Exam Updated Vital Signs BP (!) 130/54   Pulse 62   Temp 98.1 F (36.7 C)   Resp 18   SpO2 96%  Physical Exam Constitutional:      Appearance: She is well-developed.  HENT:     Head: Normocephalic and atraumatic.  Eyes:     Conjunctiva/sclera: Conjunctivae normal.  Cardiovascular:     Rate and Rhythm: Normal rate.  Pulmonary:     Effort: Pulmonary effort is normal. No respiratory distress.  Abdominal:     General: There is no distension.     Tenderness: There is no abdominal tenderness. There is no guarding.  Musculoskeletal:        General: Normal range of motion.     Cervical back: Normal range of motion. No rigidity.     Comments: Moderate swelling right elbow with superficial abrasions.  No bleeding or lacerations.  Elbow is held in a mid flexed position and she is unable to flex without moderate pain.  She is able to perform some extension with much less discomfort.  She is neuro vas intact and right upper extremity.  Both hips move well with internal ex rotation with no discomfort.  She is nontender along the cervical thoracic or lumbar spinous process.  Skin:    General: Skin is warm.     Findings: No rash.  Neurological:     General: No focal deficit present.     Mental Status: She is alert and oriented to person, place, and time. Mental status is at baseline.     Cranial Nerves: No cranial nerve deficit.     Motor: No weakness.  Psychiatric:        Behavior: Behavior normal.        Thought Content: Thought content normal.     ED Results / Procedures / Treatments   Labs (all labs ordered are listed, but only abnormal results are displayed) Labs Reviewed - No data to display  EKG None  Radiology CT Cervical Spine Wo  Contrast Result Date: 03/10/2023 CLINICAL DATA:  Fall EXAM: CT HEAD WITHOUT CONTRAST CT CERVICAL SPINE WITHOUT CONTRAST TECHNIQUE: Multidetector CT imaging of the head and cervical spine was performed following the standard protocol without intravenous contrast. Multiplanar CT image reconstructions of the cervical spine were also generated. RADIATION DOSE REDUCTION: This exam was performed according to the departmental dose-optimization program which includes automated exposure control, adjustment of the mA and/or kV according to patient size and/or use of iterative reconstruction technique. COMPARISON:  None Available. FINDINGS: CT HEAD FINDINGS Brain: There is no mass, hemorrhage or extra-axial collection. There is generalized atrophy without lobar predilection. There is hypoattenuation of the periventricular  white matter, most commonly indicating chronic ischemic microangiopathy. Vascular: No abnormal hyperdensity of the major intracranial arteries or dural venous sinuses. No intracranial atherosclerosis. Skull: The visualized skull base, calvarium and extracranial soft tissues are normal. Sinuses/Orbits: No fluid levels or advanced mucosal thickening of the visualized paranasal sinuses. No mastoid or middle ear effusion. The orbits are normal. CT CERVICAL SPINE FINDINGS Alignment: Grade 1 anterolisthesis at C4-5, C5-6. Skull base and vertebrae: No acute fracture. Soft tissues and spinal canal: No prevertebral fluid or swelling. No visible canal hematoma. Disc levels: No advanced spinal canal or neural foraminal stenosis. Upper chest: No pneumothorax, pulmonary nodule or pleural effusion. Other: Normal visualized paraspinal cervical soft tissues. IMPRESSION: 1. No acute intracranial abnormality. 2. Chronic ischemic microangiopathy and generalized atrophy. 3. No acute abnormality of the cervical spine. Electronically Signed   By: Deatra Robinson M.D.   On: 03/10/2023 21:29   CT Head Wo Contrast Result Date:  03/10/2023 CLINICAL DATA:  Fall EXAM: CT HEAD WITHOUT CONTRAST CT CERVICAL SPINE WITHOUT CONTRAST TECHNIQUE: Multidetector CT imaging of the head and cervical spine was performed following the standard protocol without intravenous contrast. Multiplanar CT image reconstructions of the cervical spine were also generated. RADIATION DOSE REDUCTION: This exam was performed according to the departmental dose-optimization program which includes automated exposure control, adjustment of the mA and/or kV according to patient size and/or use of iterative reconstruction technique. COMPARISON:  None Available. FINDINGS: CT HEAD FINDINGS Brain: There is no mass, hemorrhage or extra-axial collection. There is generalized atrophy without lobar predilection. There is hypoattenuation of the periventricular white matter, most commonly indicating chronic ischemic microangiopathy. Vascular: No abnormal hyperdensity of the major intracranial arteries or dural venous sinuses. No intracranial atherosclerosis. Skull: The visualized skull base, calvarium and extracranial soft tissues are normal. Sinuses/Orbits: No fluid levels or advanced mucosal thickening of the visualized paranasal sinuses. No mastoid or middle ear effusion. The orbits are normal. CT CERVICAL SPINE FINDINGS Alignment: Grade 1 anterolisthesis at C4-5, C5-6. Skull base and vertebrae: No acute fracture. Soft tissues and spinal canal: No prevertebral fluid or swelling. No visible canal hematoma. Disc levels: No advanced spinal canal or neural foraminal stenosis. Upper chest: No pneumothorax, pulmonary nodule or pleural effusion. Other: Normal visualized paraspinal cervical soft tissues. IMPRESSION: 1. No acute intracranial abnormality. 2. Chronic ischemic microangiopathy and generalized atrophy. 3. No acute abnormality of the cervical spine. Electronically Signed   By: Deatra Robinson M.D.   On: 03/10/2023 21:29   DG Elbow Complete Right Result Date: 03/10/2023 CLINICAL  DATA:  Larey Seat on concrete landing on right elbow. Baseball size swelling on the elbow and discoloration. EXAM: RIGHT ELBOW - COMPLETE 3+ VIEW COMPARISON:  None Available. FINDINGS: Demineralization. Acute distracted intra-articular fracture of the olecranon. There is 1.6 cm of distraction. Large elbow joint effusion. Degenerative arthritis about the elbow. Soft tissue swelling or hematoma about the posterior elbow. IMPRESSION: Acute distracted olecranon fracture. Electronically Signed   By: Minerva Fester M.D.   On: 03/10/2023 20:11    Procedures .Ortho Injury Treatment  Date/Time: 03/10/2023 11:31 PM  Performed by: Evon Slack, PA-C Authorized by: Evon Slack, PA-C   Consent:    Consent obtained:  Verbal   Consent given by:  PatientInjury location: elbow Location details: right elbow Injury type: fracture Fracture type: olecranon process Pre-procedure neurovascular assessment: neurovascularly intact Pre-procedure distal perfusion: normal Pre-procedure neurological function: normal Pre-procedure range of motion: reduced Splint type: long arm Splint Applied by: ED Provider Supplies used: cotton padding, elastic  bandage and Ortho-Glass (Nonstick dressing applied over elbow where superficial abrasions are present.) Post-procedure distal perfusion: normal Post-procedure neurological function: normal Post-procedure range of motion: unchanged       Medications Ordered in ED Medications  acetaminophen (TYLENOL) tablet 1,000 mg (1,000 mg Oral Given 03/10/23 2148)    ED Course/ Medical Decision Making/ A&P                                 Medical Decision Making Risk OTC drugs.   89 year old female with right elbow fracture along the olecranon.  There is some displacement noted.  She is neuro vas intact right upper extremity.  Uncertain of head injury although she suspects possibly hitting her head after mechanical fall.  CT of the head and cervical spine negative for any  acute intracranial or bony cervical abnormality.  Patient is placed into a right elbow posterior splint with sling.  She will take Tylenol as needed for pain.  She will call orthopedic office to schedule follow-up appointment and understands signs symptoms return to the ER for. Final Clinical Impression(s) / ED Diagnoses Final diagnoses:  Olecranon fracture, right, closed, initial encounter  Fall, initial encounter  Injury of head, initial encounter    Rx / DC Orders ED Discharge Orders     None         Ronnette Juniper 03/10/23 2332    Merwyn Katos, MD 03/11/23 [phone]

## 2023-03-10 NOTE — Discharge Instructions (Signed)
Please wear splint at all times.  Use sling as needed for comfort.  Keep splint clean and dry.  Take Tylenol 1000 mg every 8 hours as needed for pain.  Call orthopedic office tomorrow to schedule follow-up appointment.  Return to the ER for any severe increasing pain, numbness, tingling or any urgent changes in your health.

## 2023-03-10 NOTE — ED Triage Notes (Signed)
Patient from [facility], tripped and fell on concrete today, swelling and pain to right elbow; unsure if she hit head.

## 2023-03-10 NOTE — ED Provider Triage Note (Signed)
Emergency Medicine Provider Triage Evaluation Note  Karla Alexander , a 89 y.o. female  was evaluated in triage.  Pt complains of fall.  Patient reports that she had a trip and fall earlier today and landed on her right elbow.  She is unsure if she hit her head.  No LOC.  Review of Systems  Positive: Elbow pain Negative: LOC  Physical Exam  BP 136/78   Pulse (!) 59   Temp 97.8 F (36.6 C) (Oral)   Resp 18   SpO2 97%  Gen:   Awake, no distress   Resp:  Normal effort  MSK:   Large hematoma over olecranon Other:    Medical Decision Making  Medically screening exam initiated at 5:33 PM.  Appropriate orders placed.  Karla B Lemming was informed that the remainder of the evaluation will be completed by another provider, this initial triage assessment does not replace that evaluation, and the importance of remaining in the ED until their evaluation is complete.     Jackelyn Hoehn, PA-C 03/10/23 1742

## 2023-03-10 NOTE — ED Notes (Signed)
Patient's guardian, Karla Alexander has been contacted at two numbers provided for him.  No answer for either call, but messages have been left on his voicemail.  [name], a caregiver from [facility] has been called, and she said that they could come get her in the morning, but had no way to transport tonight.

## 2023-03-11 NOTE — ED Notes (Signed)
After Hours [facility] DSS  supervisor contacted and Clinical research associate spoke with Toniann Fail who obtained information on visit as well as discharge. Per Toniann Fail, contact made to care home where pt is a resident and request EMS transport back.

## 2023-03-22 ENCOUNTER — Inpatient Hospital Stay (HOSPITAL_BASED_OUTPATIENT_CLINIC_OR_DEPARTMENT_OTHER): Payer: 59 | Admitting: Internal Medicine

## 2023-03-22 ENCOUNTER — Encounter: Payer: Self-pay | Admitting: Internal Medicine

## 2023-03-22 VITALS — BP 102/79 | HR 76 | Temp 97.5°F | Ht 60.0 in | Wt 144.6 lb

## 2023-03-22 DIAGNOSIS — N2889 Other specified disorders of kidney and ureter: Secondary | ICD-10-CM | POA: Diagnosis not present

## 2023-03-22 NOTE — Progress Notes (Signed)
[facility] CONSULT NOTE  Patient Care Team: Koren Bound, NP as PCP - General (Nurse Practitioner) Michaelyn Barter, MD as Consulting Physician (Oncology)  REFERRING PROVIDER: Koren Bound, NP  REASON FOR REFFERAL: right renal mass   CANCER STAGING   Cancer Staging  No matching staging information was found for the patient.   ASSESSMENT & PLAN:  Karla Alexander 89 y.o. female with pmh of dementia, depression, GERD, hyperlipidemia, hypertension, hypothyroidism, CAD referred to medical oncology for right renal mass.  # Right renal mass -Known since May 2019.  Previously followed with Dr. Apolinar Junes and then with Dr. Noel Christmas, urologist at [facility].  Last imaging from 09/12/2021 showed right inferior pole mass measuring 7 x 6 x 7.6 cm previously measuring 7.2 x 5.9 x 7.1 cm from 11/20/2020. Plan was to repeat imaging in 1 year but then patient moved to [place].  She previously had discussions with urologist about surgery versus surveillance and opted for surveillance.  -CT imaging from 03/09/2023 showed 3 mm right middle lobe nodule unchanged.  2 mm left lower lobe nodule unchanged.  7.5 x 6.3 cm enhancing right lower pole renal mass.  Compared to 2019 6.2 x 4.9 cm.  11 mm gastrohepatic node unchanged likely reactive.  Patient was evaluated by Dr. Signa Kell, urology and due to slow growth of the tumor and her comorbidities surveillance was favored.  I will handover care to urology.  She is scheduled to follow-up with Dr. Apolinar Junes in December 2025 and their office will schedule for CT imaging in 1 year.  No orders of the defined types were placed in this encounter.  RTC as needed  The total time spent in the appointment was 30 minutes encounter with patients including review of chart and various tests results, discussions about plan of care and coordination of care plan   All questions were answered. The patient knows to call the clinic with any  problems, questions or concerns. No barriers to learning was detected.  Michaelyn Barter, MD 12/30/202412:26 PM   HISTORY OF PRESENTING ILLNESS:  Karla Alexander 89 y.o. female with pmh of dementia, depression, GERD, hyperlipidemia, hypertension, hypothyroidism, CAD referred to medical oncology for right renal mass.  Patient has known right kidney inferior pole mass since May 2019.  She was previously following with Dr. Apolinar Junes.  CT abdomen from 02/04/2018 showed 6.2 x 4.9 x 5.3 cm enhancing right lower pole renal mass compatible with RCC.  Had discussion about surgery versus surveillance.  Patient opted for surveillance.  She then moved and established care with Dr. Noel Christmas, urologist at [facility].  Last imaging from 09/12/2021 showed right inferior pole mass measuring 7 x 6 x 7.6 cm previously measuring 7.2 x 5.9 x 7.1 cm from 11/20/2020.  Patient continued to opt for surveillance.  Plan was to repeat imaging in 1 year but then patient moved to [place].  She reestablished care with primary care.  Complained of right sided abdominal pain.  Ultrasound of the abdomen showed 8 cm right renal mass and patient was referred to medical oncology.  Interval history Patient is a poor historian.  Accompanied with her caregiver.  Has legal guardianship.  Patient had a fall recently and sustained right olecranon fracture.  Has followed up with orthopedics and has sling in place.  Complains of lower back pain which is chronic.  I have reviewed her chart and materials related to her cancer extensively and collaborated history with the  patient. Summary of oncologic history is as follows: Oncology History   No history exists.    MEDICAL HISTORY:  Past Medical History:  Diagnosis Date   Arthritis    Constipation    Coronary artery disease    non-obstructive CAD 04/2013   Dementia (HCC)    Depression    GERD (gastroesophageal reflux disease)    Hyperlipemia    Hypertension     dr Trish Mage   [place]   Hypothyroidism    Seasonal allergies    Shortness of breath     SURGICAL HISTORY: Past Surgical History:  Procedure Laterality Date   BACK SURGERY     CARDIAC CATHETERIZATION  04/24/2013   20% mLAD, 20% OM1, 30% #1/60% #2 pRCA [facility]).  EF 68% by stress test 04/12/13.    MAXIMUM ACCESS (MAS)POSTERIOR LUMBAR INTERBODY FUSION (PLIF) 2 LEVEL N/A 05/31/2013   Procedure: FOR MAXIMUM ACCESS (MAS) POSTERIOR LUMBAR INTERBODY FUSION (PLIF) Lumbar Four-Five, Lumbar Five-Sacral One;  Surgeon: Tia Alert, MD;  Location: [facility]UR[facility] OR[facility];  Service: Neurosurgery;  Laterality: N/A;  FOR MAXIMUM ACCESS (MAS) POSTERIOR LUMBAR INTERBODY FUSION (PLIF) Lumbar Four-Five, Lumbar Five-Sacral One   REVERSE SHOULDER ARTHROPLASTY Left 08/23/2014   Procedure: REVERSE SHOULDER ARTHROPLASTY WITH APPLICATION OF CABLES ;  Surgeon: Jones Broom, MD;  Location: [facility] OR;  Service: Orthopedics;  Laterality: Left;  Left reverse total shoulder arthroplasty    SOCIAL HISTORY: Social History   Socioeconomic History   Marital status: Legally Separated    Spouse name: Not on file   Number of children: Not on file   Years of education: Not on file   Highest education level: Not on file  Occupational History   Not on file  Tobacco Use   Smoking status: Never    Passive exposure: Never   Smokeless tobacco: Never  Vaping Use   Vaping status: Never Used  Substance and Sexual Activity   Alcohol use: No   Drug use: No   Sexual activity: Not on file  Other Topics Concern   Not on file  Social History Narrative   Not on file   Social Drivers of Health   Financial Resource Strain: Not on file  Food Insecurity: No Food Insecurity (02/23/2023)   Hunger Vital Sign    Worried About Running Out of Food in the Last Year: Never true    Ran Out of Food in the Last Year: Never true  Transportation Needs: No Transportation Needs (02/23/2023)   PRAPARE - Administrator, Civil Service  (Medical): No    Lack of Transportation (Non-Medical): No  Physical Activity: Not on file  Stress: Not on file  Social Connections: Not on file  Intimate Partner Violence: Not At Risk (02/23/2023)   Humiliation, Afraid, Rape, and Kick questionnaire    Fear of Current or Ex-Partner: No    Emotionally Abused: No    Physically Abused: No    Sexually Abused: No    FAMILY HISTORY: Family History  Problem Relation Age of Onset   Hypertension Other     ALLERGIES:  has no known allergies.  MEDICATIONS:  Current Outpatient Medications  Medication Sig Dispense Refill   acetaminophen (TYLENOL) 650 MG CR tablet Take 650 mg by mouth every 4 (four) hours as needed for pain.     amLODipine (NORVASC) 5 MG tablet SMARTSIG:1.0 Tablet(s) By Mouth Daily     carboxymethylcellulose (REFRESH TEARS) 0.5 % SOLN Apply to eye.     donepezil (ARICEPT) 10 MG tablet  SMARTSIG:1.0 Tablet(s) By Mouth Daily     ferrous sulfate 325 (65 FE) MG EC tablet 1 tablet (325 mg total) 3 (three) times a week.     furosemide (LASIX) 20 MG tablet Take 20 mg by mouth daily.     gabapentin (NEURONTIN) 100 MG capsule Take 200 mg by mouth 3 (three) times daily.     GEMTESA 75 MG TABS Take 1 tablet by mouth daily.     HYDROcodone-acetaminophen (NORCO/VICODIN) 5-325 MG tablet Take 1 tablet by mouth 2 (two) times daily as needed.     isosorbide mononitrate (ISMO) 10 MG tablet 30 mg daily.     levothyroxine (SYNTHROID, LEVOTHROID) 100 MCG tablet Take 112 mcg by mouth daily before breakfast.     lidocaine (LIDODERM) 5 % Place 1 patch onto the skin every 12 (twelve) hours. Remove & Discard patch within 12 hours or as directed by MD 10 patch 0   loratadine (CLARITIN) 10 MG tablet Take 10 mg by mouth daily.     metoprolol tartrate (LOPRESSOR) 25 MG tablet Take 75 mg by mouth 2 (two) times daily.     omeprazole (PRILOSEC) 20 MG capsule Take 20 mg by mouth daily.     polyethylene glycol (MIRALAX / GLYCOLAX) packet Take 17 g by mouth  daily.     pseudoephedrine (SUDAFED) 60 MG tablet Take 60 mg by mouth every 4 (four) hours as needed for congestion.     rOPINIRole (REQUIP) 1 MG tablet Take 1 mg by mouth at bedtime.     sertraline (ZOLOFT) 25 MG tablet Take 75 mg by mouth daily.     Tiotropium Bromide-Olodaterol (STIOLTO RESPIMAT) 2.5-2.5 MCG/ACT AERS Inhale 2 puffs into the lungs daily.     traZODone (DESYREL) 50 MG tablet Take 50 mg by mouth at bedtime.     No current facility-administered medications for this visit.    REVIEW OF SYSTEMS:   Pertinent information mentioned in HPI All other systems were reviewed with the patient and are negative.  PHYSICAL EXAMINATION: ECOG PERFORMANCE STATUS: 2 - Symptomatic, <50% confined to bed  Vitals:   03/22/23 0952  BP: 102/79  Pulse: 76  Temp: (!) 97.5 F (36.4 C)  SpO2: 97%   Filed Weights   03/22/23 0952  Weight: 144 lb 9.6 oz (65.6 kg)    GENERAL:alert, no distress and comfortable SKIN: skin color, texture, turgor are normal, no rashes or significant lesions EYES: normal, conjunctiva are pink and non-injected, sclera clear OROPHARYNX:no exudate, no erythema and lips, buccal mucosa, and tongue normal  NECK: supple, thyroid normal size, non-tender, without nodularity LYMPH:  no palpable lymphadenopathy in the cervical, axillary or inguinal LUNGS: clear to auscultation and percussion with normal breathing effort HEART: regular rate & rhythm and no murmurs and no lower extremity edema ABDOMEN:abdomen soft, non-tender and normal bowel sounds Musculoskeletal:no cyanosis of digits and no clubbing  PSYCH: alert & oriented x 3 with fluent speech NEURO: no focal motor/sensory deficits  LABORATORY DATA:  I have reviewed the data as listed Lab Results  Component Value Date   WBC 6.2 02/23/2023   HGB 15.2 (H) 02/23/2023   HCT 46.3 (H) 02/23/2023   MCV 96.1 02/23/2023   PLT 239 02/23/2023   Recent Labs    02/23/23 1145  NA 127*  K 4.3  CL 93*  CO2 26   GLUCOSE 99  BUN 18  CREATININE 1.27*  CALCIUM 9.3  GFRNONAA 41*  PROT 6.9  ALBUMIN 4.0  AST 18  ALT 14  ALKPHOS 70  BILITOT 0.7    RADIOGRAPHIC STUDIES: I have personally reviewed the radiological images as listed and agreed with the findings in the report. CT ABDOMEN PELVIS W WO CONTRAST Result Date: 03/15/2023 CLINICAL DATA:  Follow-up right renal mass EXAM: CT CHEST WITH CONTRAST CT ABDOMEN AND PELVIS WITHOUT AND WITH CONTRAST TECHNIQUE: Multidetector CT imaging of the abdomen and pelvis was performed following the standard protocol before and following the bolus administration of intravenous contrast. Multidetector CT imaging of the chest was performed following the standard protocol during bolus administration of intravenous contrast. RADIATION DOSE REDUCTION: This exam was performed according to the departmental dose-optimization program which includes automated exposure control, adjustment of the mA and/or kV according to patient size and/or use of iterative reconstruction technique. CONTRAST:  80mL OMNIPAQUE IOHEXOL 300 MG/ML  SOLN COMPARISON:  CT abdomen dated 02/04/2018. CT chest dated 10/04/2017. FINDINGS: CT CHEST FINDINGS Cardiovascular: Heart is top-normal in size. No pericardial effusion. No evidence of thoracic aortic aneurysm. Atherosclerotic calcifications of the aortic arch. Mild coronary atherosclerosis of the LAD and right coronary. Mediastinum/Nodes: No suspicious mediastinal lymphadenopathy. Visualized thyroid is unremarkable. Lungs/Pleura: Evaluation of the lung parenchyma is constrained by respiratory motion. 3 mm right middle lobe nodule (series 3/image 83), unchanged. 2 mm subpleural left lower lobe nodule (series 3/image 48), unchanged. No new/suspicious pulmonary nodules Mild biapical pleural-parenchymal scarring. No focal consolidation. No pleural effusion or pneumothorax. Musculoskeletal: Degenerative changes of the thoracic spine. CT ABDOMEN PELVIS FINDINGS  Hepatobiliary: Liver is within normal limits. Gallbladder is unremarkable. No intrahepatic or extrahepatic duct dilatation. Pancreas: Within normal limits. Spleen: Within normal limits. Adrenals/Urinary Tract: Adrenal glands are within normal limits. 7.5 x 6.3 cm enhancing right lower pole renal mass (series 16/image 47), compatible with renal cell carcinoma, previously 6.2 x 4.9 cm. 3 mm nonobstructing right upper pole renal calculus (series 2/image 65). No hydronephrosis. Left kidney is within normal limits. Bladder is within normal limits. Stomach/Bowel: Stomach is notable for a small hiatal hernia. No evidence of bowel obstruction. Normal appendix (series 11/image 114). Mild left colonic diverticulosis, without evidence of diverticulitis. Vascular/Lymphatic: No evidence of abdominal aortic aneurysm. Atherosclerotic calcifications of the abdominal aorta and branch vessels, although vessels remain patent. 11 mm short axis gastrohepatic node (series 11/image 48), unchanged from 2019, likely reactive. No suspicious abdominopelvic lymphadenopathy. Reproductive: Uterus is within normal limits. Bilateral ovaries are within normal limits. Other: No abdominopelvic ascites. Musculoskeletal: Status post PLIF at L4-S1. Grade 1 anterolisthesis of L5 on S1. Mild degenerative changes of the lumbar spine. IMPRESSION: 7.5 cm right lower pole renal mass, compatible with renal cell carcinoma, increased. Stable 11 mm short axis gastrohepatic node, likely reactive. No findings suspicious for metastatic disease. Additional stable ancillary findings as above. Electronically Signed   By: Charline Bills M.D.   On: 03/15/2023 00:35   CT Chest W Contrast Result Date: 03/15/2023 CLINICAL DATA:  Follow-up right renal mass EXAM: CT CHEST WITH CONTRAST CT ABDOMEN AND PELVIS WITHOUT AND WITH CONTRAST TECHNIQUE: Multidetector CT imaging of the abdomen and pelvis was performed following the standard protocol before and following the bolus  administration of intravenous contrast. Multidetector CT imaging of the chest was performed following the standard protocol during bolus administration of intravenous contrast. RADIATION DOSE REDUCTION: This exam was performed according to the departmental dose-optimization program which includes automated exposure control, adjustment of the mA and/or kV according to patient size and/or use of iterative reconstruction technique. CONTRAST:  80mL OMNIPAQUE IOHEXOL 300 MG/ML  SOLN COMPARISON:  CT  abdomen dated 02/04/2018. CT chest dated 10/04/2017. FINDINGS: CT CHEST FINDINGS Cardiovascular: Heart is top-normal in size. No pericardial effusion. No evidence of thoracic aortic aneurysm. Atherosclerotic calcifications of the aortic arch. Mild coronary atherosclerosis of the LAD and right coronary. Mediastinum/Nodes: No suspicious mediastinal lymphadenopathy. Visualized thyroid is unremarkable. Lungs/Pleura: Evaluation of the lung parenchyma is constrained by respiratory motion. 3 mm right middle lobe nodule (series 3/image 83), unchanged. 2 mm subpleural left lower lobe nodule (series 3/image 48), unchanged. No new/suspicious pulmonary nodules Mild biapical pleural-parenchymal scarring. No focal consolidation. No pleural effusion or pneumothorax. Musculoskeletal: Degenerative changes of the thoracic spine. CT ABDOMEN PELVIS FINDINGS Hepatobiliary: Liver is within normal limits. Gallbladder is unremarkable. No intrahepatic or extrahepatic duct dilatation. Pancreas: Within normal limits. Spleen: Within normal limits. Adrenals/Urinary Tract: Adrenal glands are within normal limits. 7.5 x 6.3 cm enhancing right lower pole renal mass (series 16/image 47), compatible with renal cell carcinoma, previously 6.2 x 4.9 cm. 3 mm nonobstructing right upper pole renal calculus (series 2/image 65). No hydronephrosis. Left kidney is within normal limits. Bladder is within normal limits. Stomach/Bowel: Stomach is notable for a small  hiatal hernia. No evidence of bowel obstruction. Normal appendix (series 11/image 114). Mild left colonic diverticulosis, without evidence of diverticulitis. Vascular/Lymphatic: No evidence of abdominal aortic aneurysm. Atherosclerotic calcifications of the abdominal aorta and branch vessels, although vessels remain patent. 11 mm short axis gastrohepatic node (series 11/image 48), unchanged from 2019, likely reactive. No suspicious abdominopelvic lymphadenopathy. Reproductive: Uterus is within normal limits. Bilateral ovaries are within normal limits. Other: No abdominopelvic ascites. Musculoskeletal: Status post PLIF at L4-S1. Grade 1 anterolisthesis of L5 on S1. Mild degenerative changes of the lumbar spine. IMPRESSION: 7.5 cm right lower pole renal mass, compatible with renal cell carcinoma, increased. Stable 11 mm short axis gastrohepatic node, likely reactive. No findings suspicious for metastatic disease. Additional stable ancillary findings as above. Electronically Signed   By: Charline Bills M.D.   On: 03/15/2023 00:35   CT Cervical Spine Wo Contrast Result Date: 03/10/2023 CLINICAL DATA:  Fall EXAM: CT HEAD WITHOUT CONTRAST CT CERVICAL SPINE WITHOUT CONTRAST TECHNIQUE: Multidetector CT imaging of the head and cervical spine was performed following the standard protocol without intravenous contrast. Multiplanar CT image reconstructions of the cervical spine were also generated. RADIATION DOSE REDUCTION: This exam was performed according to the departmental dose-optimization program which includes automated exposure control, adjustment of the mA and/or kV according to patient size and/or use of iterative reconstruction technique. COMPARISON:  None Available. FINDINGS: CT HEAD FINDINGS Brain: There is no mass, hemorrhage or extra-axial collection. There is generalized atrophy without lobar predilection. There is hypoattenuation of the periventricular white matter, most commonly indicating chronic  ischemic microangiopathy. Vascular: No abnormal hyperdensity of the major intracranial arteries or dural venous sinuses. No intracranial atherosclerosis. Skull: The visualized skull base, calvarium and extracranial soft tissues are normal. Sinuses/Orbits: No fluid levels or advanced mucosal thickening of the visualized paranasal sinuses. No mastoid or middle ear effusion. The orbits are normal. CT CERVICAL SPINE FINDINGS Alignment: Grade 1 anterolisthesis at C4-5, C5-6. Skull base and vertebrae: No acute fracture. Soft tissues and spinal canal: No prevertebral fluid or swelling. No visible canal hematoma. Disc levels: No advanced spinal canal or neural foraminal stenosis. Upper chest: No pneumothorax, pulmonary nodule or pleural effusion. Other: Normal visualized paraspinal cervical soft tissues. IMPRESSION: 1. No acute intracranial abnormality. 2. Chronic ischemic microangiopathy and generalized atrophy. 3. No acute abnormality of the cervical spine. Electronically Signed   By:  Deatra Robinson M.D.   On: 03/10/2023 21:29   CT Head Wo Contrast Result Date: 03/10/2023 CLINICAL DATA:  Fall EXAM: CT HEAD WITHOUT CONTRAST CT CERVICAL SPINE WITHOUT CONTRAST TECHNIQUE: Multidetector CT imaging of the head and cervical spine was performed following the standard protocol without intravenous contrast. Multiplanar CT image reconstructions of the cervical spine were also generated. RADIATION DOSE REDUCTION: This exam was performed according to the departmental dose-optimization program which includes automated exposure control, adjustment of the mA and/or kV according to patient size and/or use of iterative reconstruction technique. COMPARISON:  None Available. FINDINGS: CT HEAD FINDINGS Brain: There is no mass, hemorrhage or extra-axial collection. There is generalized atrophy without lobar predilection. There is hypoattenuation of the periventricular white matter, most commonly indicating chronic ischemic microangiopathy.  Vascular: No abnormal hyperdensity of the major intracranial arteries or dural venous sinuses. No intracranial atherosclerosis. Skull: The visualized skull base, calvarium and extracranial soft tissues are normal. Sinuses/Orbits: No fluid levels or advanced mucosal thickening of the visualized paranasal sinuses. No mastoid or middle ear effusion. The orbits are normal. CT CERVICAL SPINE FINDINGS Alignment: Grade 1 anterolisthesis at C4-5, C5-6. Skull base and vertebrae: No acute fracture. Soft tissues and spinal canal: No prevertebral fluid or swelling. No visible canal hematoma. Disc levels: No advanced spinal canal or neural foraminal stenosis. Upper chest: No pneumothorax, pulmonary nodule or pleural effusion. Other: Normal visualized paraspinal cervical soft tissues. IMPRESSION: 1. No acute intracranial abnormality. 2. Chronic ischemic microangiopathy and generalized atrophy. 3. No acute abnormality of the cervical spine. Electronically Signed   By: Deatra Robinson M.D.   On: 03/10/2023 21:29   DG Elbow Complete Right Result Date: 03/10/2023 CLINICAL DATA:  Larey Seat on concrete landing on right elbow. Baseball size swelling on the elbow and discoloration. EXAM: RIGHT ELBOW - COMPLETE 3+ VIEW COMPARISON:  None Available. FINDINGS: Demineralization. Acute distracted intra-articular fracture of the olecranon. There is 1.6 cm of distraction. Large elbow joint effusion. Degenerative arthritis about the elbow. Soft tissue swelling or hematoma about the posterior elbow. IMPRESSION: Acute distracted olecranon fracture. Electronically Signed   By: Minerva Fester M.D.   On: 03/10/2023 20:11

## 2023-03-22 NOTE — Progress Notes (Signed)
Pain in rt arm, back and shoulder 6/10.  Fell 2 weeks ago, broke elbow/shoulder, right.  C/o trouble swallowing solids.

## 2023-07-11 ENCOUNTER — Encounter: Payer: Self-pay | Admitting: Internal Medicine

## 2023-07-11 ENCOUNTER — Observation Stay
Admission: EM | Admit: 2023-07-11 | Discharge: 2023-07-15 | Disposition: A | Discharge status: Home / Self Care | Attending: Internal Medicine | Admitting: Internal Medicine

## 2023-07-11 ENCOUNTER — Other Ambulatory Visit: Payer: Self-pay

## 2023-07-11 ENCOUNTER — Emergency Department

## 2023-07-11 ENCOUNTER — Observation Stay

## 2023-07-11 DIAGNOSIS — F3340 Major depressive disorder, recurrent, in remission, unspecified: Secondary | ICD-10-CM | POA: Diagnosis present

## 2023-07-11 DIAGNOSIS — R195 Other fecal abnormalities: Secondary | ICD-10-CM | POA: Diagnosis not present

## 2023-07-11 DIAGNOSIS — J449 Chronic obstructive pulmonary disease, unspecified: Secondary | ICD-10-CM | POA: Diagnosis present

## 2023-07-11 DIAGNOSIS — F3342 Major depressive disorder, recurrent, in full remission: Secondary | ICD-10-CM | POA: Insufficient documentation

## 2023-07-11 DIAGNOSIS — X58XXXA Exposure to other specified factors, initial encounter: Secondary | ICD-10-CM | POA: Diagnosis not present

## 2023-07-11 DIAGNOSIS — Z23 Encounter for immunization: Secondary | ICD-10-CM | POA: Insufficient documentation

## 2023-07-11 DIAGNOSIS — E039 Hypothyroidism, unspecified: Secondary | ICD-10-CM | POA: Diagnosis not present

## 2023-07-11 DIAGNOSIS — K5730 Diverticulosis of large intestine without perforation or abscess without bleeding: Secondary | ICD-10-CM | POA: Insufficient documentation

## 2023-07-11 DIAGNOSIS — N1831 Chronic kidney disease, stage 3a: Secondary | ICD-10-CM | POA: Insufficient documentation

## 2023-07-11 DIAGNOSIS — E785 Hyperlipidemia, unspecified: Secondary | ICD-10-CM | POA: Diagnosis not present

## 2023-07-11 DIAGNOSIS — K219 Gastro-esophageal reflux disease without esophagitis: Secondary | ICD-10-CM | POA: Diagnosis not present

## 2023-07-11 DIAGNOSIS — I2510 Atherosclerotic heart disease of native coronary artery without angina pectoris: Secondary | ICD-10-CM

## 2023-07-11 DIAGNOSIS — N2889 Other specified disorders of kidney and ureter: Secondary | ICD-10-CM | POA: Diagnosis not present

## 2023-07-11 DIAGNOSIS — G2581 Restless legs syndrome: Secondary | ICD-10-CM | POA: Diagnosis not present

## 2023-07-11 DIAGNOSIS — I10 Essential (primary) hypertension: Secondary | ICD-10-CM | POA: Diagnosis present

## 2023-07-11 DIAGNOSIS — Z79899 Other long term (current) drug therapy: Secondary | ICD-10-CM | POA: Diagnosis not present

## 2023-07-11 DIAGNOSIS — S42209A Unspecified fracture of upper end of unspecified humerus, initial encounter for closed fracture: Secondary | ICD-10-CM | POA: Diagnosis present

## 2023-07-11 DIAGNOSIS — R062 Wheezing: Secondary | ICD-10-CM | POA: Diagnosis present

## 2023-07-11 DIAGNOSIS — S4290XA Fracture of unspecified shoulder girdle, part unspecified, initial encounter for closed fracture: Secondary | ICD-10-CM | POA: Diagnosis not present

## 2023-07-11 DIAGNOSIS — I129 Hypertensive chronic kidney disease with stage 1 through stage 4 chronic kidney disease, or unspecified chronic kidney disease: Secondary | ICD-10-CM | POA: Diagnosis not present

## 2023-07-11 DIAGNOSIS — Z981 Arthrodesis status: Secondary | ICD-10-CM | POA: Diagnosis not present

## 2023-07-11 DIAGNOSIS — K921 Melena: Principal | ICD-10-CM | POA: Insufficient documentation

## 2023-07-11 DIAGNOSIS — K922 Gastrointestinal hemorrhage, unspecified: Principal | ICD-10-CM

## 2023-07-11 DIAGNOSIS — R0602 Shortness of breath: Secondary | ICD-10-CM | POA: Diagnosis present

## 2023-07-11 DIAGNOSIS — R079 Chest pain, unspecified: Secondary | ICD-10-CM | POA: Insufficient documentation

## 2023-07-11 LAB — COMPREHENSIVE METABOLIC PANEL WITH GFR
ALT: 23 U/L (ref 0–44)
AST: 28 U/L (ref 15–41)
Albumin: 4.1 g/dL (ref 3.5–5.0)
Alkaline Phosphatase: 62 U/L (ref 38–126)
Anion gap: 7 (ref 5–15)
BUN: 19 mg/dL (ref 8–23)
CO2: 26 mmol/L (ref 22–32)
Calcium: 9.2 mg/dL (ref 8.9–10.3)
Chloride: 99 mmol/L (ref 98–111)
Creatinine, Ser: 1.07 mg/dL — ABNORMAL HIGH (ref 0.44–1.00)
GFR, Estimated: 50 mL/min — ABNORMAL LOW (ref >60)
Glucose, Bld: 108 mg/dL — ABNORMAL HIGH (ref 70–99)
Potassium: 4.4 mmol/L (ref 3.5–5.1)
Sodium: 132 mmol/L — ABNORMAL LOW (ref 135–145)
Total Bilirubin: 1 mg/dL (ref 0.0–1.2)
Total Protein: 6.9 g/dL (ref 6.5–8.1)

## 2023-07-11 LAB — CBC
HCT: 46 % (ref 36.0–46.0)
Hemoglobin: 15.3 g/dL — ABNORMAL HIGH (ref 12.0–15.0)
MCH: 32.6 pg (ref 26.0–34.0)
MCHC: 33.3 g/dL (ref 30.0–36.0)
MCV: 97.9 fL (ref 80.0–100.0)
Platelets: 216 10*3/uL (ref 150–400)
RBC: 4.7 MIL/uL (ref 3.87–5.11)
RDW: 12.4 % (ref 11.5–15.5)
WBC: 4.8 10*3/uL (ref 4.0–10.5)
nRBC: 0 % (ref 0.0–0.2)

## 2023-07-11 LAB — TYPE AND SCREEN
ABO/RH(D): O POS
Antibody Screen: NEGATIVE

## 2023-07-11 LAB — PROTIME-INR
INR: 0.9 (ref 0.8–1.2)
Prothrombin Time: 12.5 s (ref 11.4–15.2)

## 2023-07-11 LAB — TROPONIN I (HIGH SENSITIVITY)
Troponin I (High Sensitivity): 7 ng/L (ref <18)
Troponin I (High Sensitivity): 8 ng/L (ref <18)

## 2023-07-11 MED ORDER — ONDANSETRON HCL 4 MG PO TABS: 4.0000 mg | ORAL_TABLET | Freq: Four times a day (QID) | ORAL | Status: DC | PRN

## 2023-07-11 MED ORDER — LIDOCAINE 5 % EX PTCH
1.0000 | MEDICATED_PATCH | Freq: Two times a day (BID) | CUTANEOUS | Status: DC | PRN
  Administered 2023-07-11 – 2023-07-14 (×4): 1 via TRANSDERMAL
  Filled 2023-07-11 (×6): qty 1

## 2023-07-11 MED ORDER — PANTOPRAZOLE SODIUM 40 MG IV SOLR
40.0000 mg | Freq: Two times a day (BID) | INTRAVENOUS | Status: DC
  Administered 2023-07-11 – 2023-07-13 (×4): 40 mg via INTRAVENOUS
  Filled 2023-07-11 (×4): qty 10

## 2023-07-11 MED ORDER — ONDANSETRON HCL 4 MG/2ML IJ SOLN: 4.0000 mg | Freq: Four times a day (QID) | INTRAMUSCULAR | Status: DC | PRN

## 2023-07-11 MED ORDER — ACETAMINOPHEN 650 MG RE SUPP: 650.0000 mg | Freq: Four times a day (QID) | RECTAL | Status: DC | PRN

## 2023-07-11 MED ORDER — PANTOPRAZOLE SODIUM 40 MG IV SOLR
40.0000 mg | INTRAVENOUS | Status: AC
  Administered 2023-07-11 (×2): 40 mg via INTRAVENOUS
  Filled 2023-07-11 (×2): qty 10

## 2023-07-11 MED ORDER — SERTRALINE HCL 50 MG PO TABS
75.0000 mg | ORAL_TABLET | Freq: Every day | ORAL | Status: DC
  Administered 2023-07-12 – 2023-07-15 (×4): 75 mg via ORAL
  Filled 2023-07-11 (×4): qty 2

## 2023-07-11 MED ORDER — IOHEXOL 350 MG/ML SOLN
75.0000 mL | Freq: Once | INTRAVENOUS | Status: AC | PRN
  Administered 2023-07-11: 75 mL via INTRAVENOUS

## 2023-07-11 MED ORDER — DONEPEZIL HCL 5 MG PO TABS
10.0000 mg | ORAL_TABLET | Freq: Every day | ORAL | Status: DC
  Administered 2023-07-11 – 2023-07-14 (×4): 10 mg via ORAL
  Filled 2023-07-11 (×4): qty 2

## 2023-07-11 MED ORDER — ACETAMINOPHEN 325 MG PO TABS
650.0000 mg | ORAL_TABLET | Freq: Four times a day (QID) | ORAL | Status: DC | PRN
  Administered 2023-07-11 – 2023-07-15 (×6): 650 mg via ORAL
  Filled 2023-07-11 (×6): qty 2

## 2023-07-11 MED ORDER — ISOSORBIDE MONONITRATE 20 MG PO TABS
30.0000 mg | ORAL_TABLET | Freq: Every day | ORAL | Status: DC
  Administered 2023-07-11 – 2023-07-15 (×5): 30 mg via ORAL
  Filled 2023-07-11 (×6): qty 2

## 2023-07-11 MED ORDER — LORATADINE 10 MG PO TABS
10.0000 mg | ORAL_TABLET | Freq: Every day | ORAL | Status: DC
  Administered 2023-07-12 – 2023-07-15 (×4): 10 mg via ORAL
  Filled 2023-07-11 (×4): qty 1

## 2023-07-11 MED ORDER — HYDRALAZINE HCL 20 MG/ML IJ SOLN: 5.0000 mg | Freq: Four times a day (QID) | INTRAMUSCULAR | Status: DC | PRN

## 2023-07-11 MED ORDER — POLYETHYLENE GLYCOL 3350 17 G PO PACK: 17.0000 g | PACK | Freq: Every day | ORAL | Status: DC | PRN

## 2023-07-11 MED ORDER — SODIUM CHLORIDE 0.9 % IV BOLUS
500.0000 mL | Freq: Once | INTRAVENOUS | Status: AC
  Administered 2023-07-11: 500 mL via INTRAVENOUS

## 2023-07-11 MED ORDER — LEVOTHYROXINE SODIUM 112 MCG PO TABS
112.0000 ug | ORAL_TABLET | Freq: Every day | ORAL | Status: DC
Start: 2023-07-12 — End: 2023-07-15
  Administered 2023-07-12 – 2023-07-15 (×4): 112 ug via ORAL
  Filled 2023-07-11 (×5): qty 1

## 2023-07-11 MED ORDER — ROPINIROLE HCL 1 MG PO TABS
1.0000 mg | ORAL_TABLET | Freq: Every day | ORAL | Status: DC
Start: 2023-07-11 — End: 2023-07-15
  Administered 2023-07-11 – 2023-07-14 (×4): 1 mg via ORAL
  Filled 2023-07-11 (×5): qty 1

## 2023-07-11 MED ORDER — METOPROLOL TARTRATE 50 MG PO TABS
75.0000 mg | ORAL_TABLET | Freq: Two times a day (BID) | ORAL | Status: DC
  Administered 2023-07-11 – 2023-07-15 (×8): 75 mg via ORAL
  Filled 2023-07-11 (×8): qty 1

## 2023-07-11 MED ORDER — GABAPENTIN 100 MG PO CAPS
200.0000 mg | ORAL_CAPSULE | Freq: Three times a day (TID) | ORAL | Status: DC
  Administered 2023-07-11 – 2023-07-15 (×11): 200 mg via ORAL
  Filled 2023-07-11 (×11): qty 2

## 2023-07-11 MED ORDER — AMLODIPINE BESYLATE 5 MG PO TABS
5.0000 mg | ORAL_TABLET | Freq: Every day | ORAL | Status: DC
  Administered 2023-07-12 – 2023-07-15 (×4): 5 mg via ORAL
  Filled 2023-07-11 (×4): qty 1

## 2023-07-11 NOTE — ED Notes (Signed)
 Attempted to call LG, Otho Blitz, without success. HIPAA compliant VM left for him to call back.

## 2023-07-11 NOTE — Assessment & Plan Note (Signed)
 Home sertraline  75 mg resumed

## 2023-07-11 NOTE — ED Triage Notes (Signed)
 Pt arrives from a group home via [facility] with c/o abd pain and dark tarry stools x2weeks. MD at group home has been trying to manage symptoms outpatient until today. Pt has a Hx of dementia and HTN.

## 2023-07-11 NOTE — ED Notes (Signed)
 Pt up to use restroom, steady gait, no assistance required at this time.

## 2023-07-11 NOTE — Assessment & Plan Note (Signed)
 Continue Protonix  twice daily Hemoglobin is 15.3 on admission and 4 months ago was 15.2 I do not suspect brisk GI bleed at this time We will check a CBC in the a.m. If hemoglobin remains stable, patient can be followed up outpatient with GI provider If hemoglobin declines, AM team to consider GI consultation for colonoscopy

## 2023-07-11 NOTE — H&P (Addendum)
 History and Physical   Josselyne  ZAKEYA JUNKER WGN:[id number] DOB: 03/10/1933 DOA: 07/11/2023  PCP: Tobi Fortes, NP  Outpatient Specialists: Dr. Katrine Parody, medical oncology Patient coming from: Group home via EMS  I have personally briefly reviewed patient's old medical records in [facility] EMR.  Chief Concern: Tarry stool  HPI: Mr. Karla Alexander is an 89 year old female with history of CKD stage IIIa, dementia, hypertension, hypothyroid, neuropathy, restless leg syndrome, GERD, who presents to the emergency department for chief concerns of melena stool for 2 weeks.  Patient was sent from outpatient PCP at group home.  Per nursing documentation, MD had been trying to manage the symptoms outpatient until today.  Vitals in the ED showed temperature of 98.7, respiration rate of 25, heart rate of 64, blood pressure 146/64, SpO2 of 96% on room air.  Serum sodium is 132, potassium 4.4, chloride 99, bicarb 26, BUN of 19, serum creatinine 1.07, EGFR 50, nonfasting blood glucose 108, WBC 4.8, hemoglobin 15.3, platelets of 216.  CT angio GI bleed done in the ED: Read as large up to 9 cm exophytic right renal cell carcinoma, mildly increased since December.  No active GI bleeding identified by multiphase CTA.  Distal large bowel diverticulosis consider diverticular bleed source if there is bright red blood per rectum.  No bowel obstruction or active inflammation.  ED treatment: Protonix  40 mg IV one-time dose, sodium chloride  500 mL liter bolus. --------------------------------------- At bedside, patient was able to tell me her first and last name, she was able to tell me the year is [birth year], and her current location is [employer].  She states her age is 89 years old.  I corrected her and informed her that she is 89 years old.  She reports that she has been having abdominal pain, in the left upper area of her stomach for about 2 to 3 weeks.  She reports that she has issues with bowel movements  chronically, and states the issue is constipation.  She states it is hard to get her stool out.  She denies any bright red blood per rectum, nausea, vomiting, shortness of breath, syncope.  She endorses that her legs are swollen.  She reports that she does have occasional chest pain going across her chest.  She denies having any chest pain now.  She states yesterday she had some chest pain across her chest.  She denies trauma to her person.  She then changes and states that it is not chest pain, it is just belly pain all across her belly.  Social history: She lives in a group home facility.  She denies tobacco, EtOH, recreational drug use.  ROS: Constitutional: no weight change, no fever ENT/Mouth: no sore throat, no rhinorrhea Eyes: no eye pain, no vision changes Cardiovascular: + chest pain, no dyspnea,  no edema, no palpitations Respiratory: no cough, no sputum, no wheezing Gastrointestinal: no nausea, no vomiting, no diarrhea, + constipation, + abdominal pain Genitourinary: no urinary incontinence, no dysuria, no hematuria Musculoskeletal: no arthralgias, no myalgias Skin: no skin lesions, no pruritus, Neuro: + weakness, no loss of consciousness, no syncope Psych: no anxiety, no depression, no decrease appetite Heme/Lymph: no bruising, no bleeding  ED Course: Discussed with EDP, patient requiring hospitalization for chief concerns of dark tarry stool.  Assessment/Plan  Principal Problem:   Dark stools Active Problems:   S/P lumbar spinal fusion   Proximal humerus fracture   Hypothyroidism   HLD (hyperlipidemia)   Hypertension   CAD S/P percutaneous coronary angioplasty  COPD (chronic obstructive pulmonary disease) (HCC)   GERD (gastroesophageal reflux disease)   Recurrent major depression in remission (HCC)   Right renal mass   Restless leg syndrome   Chest pain   Assessment and Plan:  * Dark stools Continue Protonix  twice daily Hemoglobin is 15.3 on admission and 4  months ago was 15.2 I do not suspect brisk GI bleed at this time We will check a CBC in the a.m. If hemoglobin remains stable, patient can be followed up outpatient with GI provider If hemoglobin declines, AM team to consider GI consultation for colonoscopy  Chest pain Check HS troponin, 2 times on admission  Restless leg syndrome Home ropinirole  1 mg nightly resumed  Right renal mass Per CT read concerning for right renal cell carcinoma Patient follows with outpatient oncology Patient will need to follow-up with Aris Bel and urology outpatient as appropriate  Recurrent major depression in remission (HCC) Home sertraline  75 mg resumed  Hypertension Home amlodipine  5 mg daily, Imdur  30 mg daily, metoprolol  tartrate 75 mg p.o. twice daily resumed Hydralazine  5 mg IV every 6 hours as needed for SBP greater 170, 5 days ordered  Hypothyroidism Home levothyroxine  112 mcg daily resumed  Chart reviewed.   PDMP reviewed: Currently no active prescription.  Last prescription was written and filled on 11/12/2022: Oxycodone -acetaminophen  5-325 mg, 12 tablets, written for 3 days.  Complete echo on 08/07/2015: EF 60-65%, Grade 2 diastolic dysfunction.  DVT prophylaxis: TED hose; pharmacologic DVT prophylaxis not initiated on admission due to concerns of upper GI bleed Code Status: Full code Diet: Clear liquid diet Family Communication: Attempted to call Otho Blitz at [phone], no pick up. Voicemail did not indicated a name so I said I am Dr. Reinhold Carbine, please call [facility] back regarding a patient that lists you as the legal guardian. Disposition Plan: Pending clinical course Consults called: None at this time Admission status: Telemetry medical, observation  Past Medical History:  Diagnosis Date   Arthritis    Constipation    Coronary artery disease    non-obstructive CAD 04/2013   Dementia (HCC)    Depression    GERD (gastroesophageal reflux disease)     Hyperlipemia    Hypertension    dr Marice Shock   [employer]   Hypothyroidism    Seasonal allergies    Shortness of breath    Past Surgical History:  Procedure Laterality Date   BACK SURGERY     CARDIAC CATHETERIZATION  04/24/2013   20% mLAD, 20% OM1, 30% #1/60% #2 pRCA [facility]).  EF 68% by stress test 04/12/13.    MAXIMUM ACCESS (MAS)POSTERIOR LUMBAR INTERBODY FUSION (PLIF) 2 LEVEL N/A 05/31/2013   Procedure: FOR MAXIMUM ACCESS (MAS) POSTERIOR LUMBAR INTERBODY FUSION (PLIF) Lumbar Four-Five, Lumbar Five-Sacral One;  Surgeon: Isadora Mar, MD;  Location: [facility] NEURO ORS;  Service: Neurosurgery;  Laterality: N/A;  FOR MAXIMUM ACCESS (MAS) POSTERIOR LUMBAR INTERBODY FUSION (PLIF) Lumbar Four-Five, Lumbar Five-Sacral One   REVERSE SHOULDER ARTHROPLASTY Left 08/23/2014   Procedure: REVERSE SHOULDER ARTHROPLASTY WITH APPLICATION OF CABLES ;  Surgeon: Sammye Cristal, MD;  Location: [facility] OR;  Service: Orthopedics;  Laterality: Left;  Left reverse total shoulder arthroplasty   Social History:  reports that she has never smoked. She has never been exposed to tobacco smoke. She has never used smokeless tobacco. She reports that she does not drink alcohol and does not use drugs.  No Known Allergies Family History  Problem Relation Age of Onset   Hypertension Other  Family history: Family history reviewed and not pertinent.  Prior to Admission medications   Medication Sig Start Date End Date Taking? Authorizing Provider  acetaminophen  (TYLENOL ) 650 MG CR tablet Take 650 mg by mouth every 4 (four) hours as needed for pain.    [provider]  amLODipine  (NORVASC ) 5 MG tablet SMARTSIG:1.0 Tablet(s) By Mouth Daily    [provider]  carboxymethylcellulose (REFRESH TEARS) 0.5 % SOLN Apply to eye. 05/01/22   [provider]  donepezil  (ARICEPT ) 10 MG tablet SMARTSIG:1.0 Tablet(s) By Mouth Daily    [provider]  ferrous sulfate 325 (65 FE) MG EC tablet 1 tablet (325 mg total) 3  (three) times a week. 04/18/21   [provider]  furosemide  (LASIX ) 20 MG tablet Take 20 mg by mouth daily.    [provider]  gabapentin  (NEURONTIN ) 100 MG capsule Take 200 mg by mouth 3 (three) times daily.    [provider]  GEMTESA 75 MG TABS Take 1 tablet by mouth daily. 02/23/23   [provider]  HYDROcodone -acetaminophen  (NORCO/VICODIN) 5-325 MG tablet Take 1 tablet by mouth 2 (two) times daily as needed. 03/16/23   [provider]  isosorbide  mononitrate (ISMO ) 10 MG tablet 30 mg daily. 02/19/23   [provider]  levothyroxine  (SYNTHROID , LEVOTHROID) 100 MCG tablet Take 112 mcg by mouth daily before breakfast. 10/01/17   [provider]  lidocaine  (LIDODERM ) 5 % Place 1 patch onto the skin every 12 (twelve) hours. Remove & Discard patch within 12 hours or as directed by MD 11/12/22 11/12/23  Loman Risk, MD  loratadine  (CLARITIN ) 10 MG tablet Take 10 mg by mouth daily.    [provider]  metoprolol  tartrate (LOPRESSOR ) 25 MG tablet Take 75 mg by mouth 2 (two) times daily.    [provider]  omeprazole (PRILOSEC) 20 MG capsule Take 20 mg by mouth daily. 02/23/23   [provider]  polyethylene glycol (MIRALAX  / GLYCOLAX ) packet Take 17 g by mouth daily.    [provider]  pseudoephedrine (SUDAFED) 60 MG tablet Take 60 mg by mouth every 4 (four) hours as needed for congestion.    [provider]  rOPINIRole  (REQUIP ) 1 MG tablet Take 1 mg by mouth at bedtime.    [provider]  sertraline  (ZOLOFT ) 25 MG tablet Take 75 mg by mouth daily.    [provider]  Tiotropium Bromide-Olodaterol (STIOLTO RESPIMAT) 2.5-2.5 MCG/ACT AERS Inhale 2 puffs into the lungs daily.    [provider]   Physical Exam: Vitals:   07/11/23 0948 07/11/23 0951 07/11/23 1257 07/11/23 1300  BP: (!) 146/64   (!) 162/69  Pulse: 64  61 (!) 59  Resp: (!) 25  19 20   Temp: 98.7 F  (37.1 C)  98 F (36.7 C)   TempSrc: Oral  Oral   SpO2: 96%  100% 98%  Weight:  69.2 kg    Height:  5' (1.524 m)     Constitutional: appears age-appropriate, frail Eyes: PERRL, lids and conjunctivae normal ENMT: Mucous membranes are moist. Posterior pharynx clear of any exudate or lesions. Age-appropriate dentition. Hearing appropriate Neck: normal, supple, no masses, no thyromegaly Respiratory: clear to auscultation bilaterally, no wheezing, no crackles. Normal respiratory effort. No accessory muscle use.  Cardiovascular: Regular rate and rhythm, no murmurs / rubs / gallops. No extremity edema. 2+ pedal pulses. No carotid bruits.  Abdomen: Obese abdomen, + mild tenderness, no masses palpated, no hepatosplenomegaly. Bowel sounds positive.  Musculoskeletal: no clubbing / cyanosis. No joint deformity upper and lower extremities. Good ROM, no contractures, no atrophy. Normal muscle tone.  Skin: no rashes, lesions, ulcers. No induration Neurologic: Sensation intact. Strength 5/5 in all 4.  Psychiatric: Normal judgment and insight. Alert and oriented x 3. Normal mood.   EKG: independently reviewed, showing sinus rhythm with rate of 59, QTc 443  Chest x-ray on Admission: I personally reviewed and I agree with radiologist reading as below.  CT ANGIO GI BLEED Result Date: 07/11/2023 CLINICAL DATA:  89 year old female with abdominal pain and dark tarry stools for 2 weeks. Known large right renal mass. * Tracking Code: BO * EXAM: CTA ABDOMEN AND PELVIS WITHOUT AND WITH CONTRAST TECHNIQUE: Multidetector CT imaging of the abdomen and pelvis was performed using the standard protocol during bolus administration of intravenous contrast. Multiplanar reconstructed images and MIPs were obtained and reviewed to evaluate the vascular anatomy. RADIATION DOSE REDUCTION: This exam was performed according to the departmental dose-optimization program which includes automated exposure control, adjustment of the mA  and/or kV according to patient size and/or use of iterative reconstruction technique. CONTRAST:  75mL OMNIPAQUE  IOHEXOL  350 MG/ML SOLN COMPARISON:  CT Abdomen and Pelvis 03/09/2023. FINDINGS: VASCULAR Aortoiliac calcified atherosclerosis. Normal caliber abdominal aorta. Major arterial structures in the abdomen and pelvis remain patent. Substantial Tumor angiogenesis in the right retroperitoneum, right pararenal space in association with large right renal cell carcinoma, detailed below. Portal venous system appears to be patent. Review of the MIP images confirms the above findings. NON-VASCULAR Lower chest: Stable heart size, upper limits of normal. No pericardial effusion. Moderate gastric hiatal hernia is stable since December. No pleural effusion. Stable lung bases, including a small right middle lobe lung nodule on series 6, image 5. No definite pulmonary metastasis. Hepatobiliary: Negative liver and gallbladder. Pancreas: Negative. Spleen: Negative. Adrenals/Urinary Tract: Adrenal glands remain normal. Left kidney is nonobstructed, normal. Large solid and enhancing exophytic right renal mass now measures up to 9 cm long axis (series 20, image 1 aunt Image 118) versus 8.7 cm when measured with a similar technique in December. Surrounding Tumor angiogenesis redemonstrated. Underlying right kidney seems to remain nonobstructed. And on delayed phase images there is fairly symmetric renal contrast excretion from both kidneys. Angio new genesis surrounding the right ureter. Mildly distended but otherwise unremarkable urinary bladder. Stomach/Bowel: Stable chronic gastric hiatal hernia. No dilated large or small bowel loops. Similar retained food or fluid in the stomach to last year. Extensive distal large bowel diverticulosis descending and sigmoid segments. Redundant right colon. Normal appendix on series 4, image 44. No contrast extravasation into the bowel lumen is identified. However, there is conspicuous  increased density of the distal rectum and anal verge over this series of exams (series 16, image 174), but favor this is mucosal enhancement. No fluid in the rectum. No pneumoperitoneum. No free fluid or mesenteric inflammation is identified. Lymphatic: No lymphadenopathy identified. Reproductive: Within normal limits. Other: No pelvis free fluid. Musculoskeletal: Advanced lumbar spine degeneration superimposed on 2 level lower lumbar and lumbosacral junction fusion. No acute or suspicious osseous lesion is identified. IMPRESSION: 1. Large, up to 9 cm exophytic Right Renal Cell Carcinoma, mildly increased since December. Surrounding Tumor Angiogenesis. No metastatic disease is identified. 2. No active GI bleeding identified by multiphase CTA. Distal large bowel diverticulosis, consider of diverticular bleeding source if there is red blood per rectum. 3.  No bowel obstruction or active inflammation.  Normal appendix. 4.  Aortic Atherosclerosis (ICD10-I70.0). Electronically Signed  By: Marlise Simpers M.D.   On: 07/11/2023 11:35   Labs on Admission: I have personally reviewed following labs  CBC: Recent Labs  Lab 07/11/23 1002  WBC 4.8  HGB 15.3*  HCT 46.0  MCV 97.9  PLT 216   Basic Metabolic Panel: Recent Labs  Lab 07/11/23 1002  NA 132*  K 4.4  CL 99  CO2 26  GLUCOSE 108*  BUN 19  CREATININE 1.07*  CALCIUM  9.2   GFR: Estimated Creatinine Clearance: 31.6 mL/min (A) (by C-G formula based on SCr of 1.07 mg/dL (H)).  Liver Function Tests: Recent Labs  Lab 07/11/23 1002  AST 28  ALT 23  ALKPHOS 62  BILITOT 1.0  PROT 6.9  ALBUMIN  4.1   Coagulation Profile: Recent Labs  Lab 07/11/23 1002  INR 0.9   Urine analysis:    Component Value Date/Time   COLORURINE YELLOW (A) 10/10/2015 1129   APPEARANCEUR CLEAR (A) 10/10/2015 1129   APPEARANCEUR Clear 01/31/2014 1418   LABSPEC 1.012 10/10/2015 1129   LABSPEC 1.013 01/31/2014 1418   PHURINE 6.0 10/10/2015 1129   GLUCOSEU NEGATIVE  10/10/2015 1129   GLUCOSEU Negative 01/31/2014 1418   HGBUR NEGATIVE 10/10/2015 1129   BILIRUBINUR NEGATIVE 10/10/2015 1129   BILIRUBINUR Negative 01/31/2014 1418   KETONESUR NEGATIVE 10/10/2015 1129   PROTEINUR NEGATIVE 10/10/2015 1129   UROBILINOGEN 1.0 08/15/2014 1236   NITRITE NEGATIVE 10/10/2015 1129   LEUKOCYTESUR 2+ (A) 10/10/2015 1129   LEUKOCYTESUR Negative 01/31/2014 1418   This document was prepared using Dragon Voice Recognition software and may include unintentional dictation errors.  Dr. Reinhold Carbine [facility]  If 7PM-7AM, please contact overnight-coverage provider If 7AM-7PM, please contact day attending provider [email or web address]  07/11/2023, 2:03 PM

## 2023-07-11 NOTE — Assessment & Plan Note (Signed)
 Home levothyroxine 112 mcg daily resumed

## 2023-07-11 NOTE — Assessment & Plan Note (Addendum)
 Per CT read concerning for right renal cell carcinoma Patient follows with outpatient oncology Patient will need to follow-up with Aris Bel and urology outpatient as appropriate

## 2023-07-11 NOTE — Assessment & Plan Note (Signed)
Home ropinirole 1 mg nightly resumed

## 2023-07-11 NOTE — Assessment & Plan Note (Signed)
 Check HS troponin, 2 times on admission

## 2023-07-11 NOTE — ED Provider Notes (Signed)
 [facility] Provider Note    Event Date/Time   First MD Initiated Contact with Patient 07/11/23 [phone]     (approximate)  History   Chief Complaint: Abdominal Pain  HPI  Karla  B Alexander is a 89 y.o. female with a past medical history of arthritis, dementia, hypertension, hyperlipidemia, presents to the emergency department from her group facility for possible GI bleed.  According to EMS patient is coming from a group facility where they have noticed some intermittent abdominal pain as well as dark tarry stools for the last 2 weeks.  Per report the group home physician has been attempting to manage this unsuccessfully so transferred the patient to the emergency department today for evaluation.  Here the patient denies any abdominal pain.  Is not sure if her stool has been dark denies any vomiting.  Physical Exam   Triage Vital Signs: ED Triage Vitals  Encounter Vitals Group     BP 07/11/23 0948 (!) 146/64     Systolic BP Percentile --      Diastolic BP Percentile --      Pulse Rate 07/11/23 0948 64     Resp 07/11/23 0948 (!) 25     Temp 07/11/23 0948 98.7 F (37.1 C)     Temp Source 07/11/23 0948 Oral     SpO2 07/11/23 0948 96 %     Weight 07/11/23 0951 152 lb 8 oz (69.2 kg)     Height 07/11/23 0951 5' (1.524 m)     Head Circumference --      Peak Flow --      Pain Score 07/11/23 0949 6     Pain Loc --      Pain Education --      Exclude from Growth Chart --     Most recent vital signs: Vitals:   07/11/23 0948  BP: (!) 146/64  Pulse: 64  Resp: (!) 25  Temp: 98.7 F (37.1 C)  SpO2: 96%    General: Awake, alert and oriented x 2, did state the year was 2024.  Answers questions seemingly appropriately. CV:  Good peripheral perfusion.  Regular rate and rhythm  Resp:  Normal effort.  Equal breath sounds bilaterally.  Abd:  No distention.  Soft, nontender.  No rebound or guarding.  Benign abdomen.  ED Results / Procedures / Treatments    EKG  EKG viewed and interpreted by myself shows a normal sinus rhythm at 59 bpm with a narrow QRS, normal axis, slight PR prolongation otherwise normal intervals, no concerning ST changes.  MEDICATIONS ORDERED IN ED: Medications  sodium chloride  0.9 % bolus 500 mL (has no administration in time range)     IMPRESSION / MDM / ASSESSMENT AND PLAN / ED COURSE  I reviewed the triage vital signs and the nursing notes.  Patient's presentation is most consistent with acute presentation with potential threat to life or bodily function.  Patient presents to the emergency department for intermittent abdominal pain as well as dark stool over the last 2 weeks.  Overall the patient appears well, no distress.  Benign abdomen on my exam.  Patient's rectal examination does show dark stool strongly guaiac positive.  Given the patient's intermittent abdominal pain complaint per EMS we will obtain a CT scan of the abdomen GI bleed protocol eyes, we will check labs, type and screen and continue to closely monitor.  Given melena we will plan to admit once the patient's workup has been completed.  I have  ordered Protonix  for the patient.  Patient's workup is overall reassuring CBC is normal with a normal H&H, chemistry reassuring.  INR is normal.  CT scan shows patient's known renal carcinoma however does not appear to show any active or significant bleed.  However given the patient's melena strongly guaiac positive with apparently failed outpatient therapy we will discuss with hospitalist for admission for further workup and treatment.  Will dose IV Protonix .  FINAL CLINICAL IMPRESSION(S) / ED DIAGNOSES   GI bleed   Note:  This document was prepared using Dragon voice recognition software and may include unintentional dictation errors.   Ruth Cove, MD 07/11/23 1229

## 2023-07-11 NOTE — Assessment & Plan Note (Addendum)
 Home amlodipine  5 mg daily, Imdur  30 mg daily, metoprolol  tartrate 75 mg p.o. twice daily resumed Hydralazine  5 mg IV every 6 hours as needed for SBP greater 170, 5 days ordered

## 2023-07-11 NOTE — Hospital Course (Signed)
 Mr. Karla Alexander is an 89 year old female with history of CKD stage IIIa, dementia, hypertension, hypothyroid, neuropathy, restless leg syndrome, GERD, who presents to the emergency department for chief concerns of melena stool for 2 weeks.  Patient was sent from outpatient PCP at group home.  Per nursing documentation, MD had been trying to manage the symptoms outpatient until today.  Vitals in the ED showed temperature of 98.7, respiration rate of 25, heart rate of 64, blood pressure 146/64, SpO2 of 96% on room air.  Serum sodium is 132, potassium 4.4, chloride 99, bicarb 26, BUN of 19, serum creatinine 1.07, EGFR 50, nonfasting blood glucose 108, WBC 4.8, hemoglobin 15.3, platelets of 216.  CT angio GI bleed done in the ED: Read as large up to 9 cm exophytic right renal cell carcinoma, mildly increased since December.  No active GI bleeding identified by multiphase CTA.  Distal large bowel diverticulosis consider diverticular bleed source if there is bright red blood per rectum.  No bowel obstruction or active inflammation.  ED treatment: Protonix  40 mg IV one-time dose, sodium chloride  500 mL liter bolus.

## 2023-07-12 DIAGNOSIS — K921 Melena: Secondary | ICD-10-CM | POA: Diagnosis not present

## 2023-07-12 DIAGNOSIS — R195 Other fecal abnormalities: Secondary | ICD-10-CM | POA: Diagnosis not present

## 2023-07-12 LAB — CBC
HCT: 42.6 % (ref 36.0–46.0)
Hemoglobin: 14.5 g/dL (ref 12.0–15.0)
MCH: 32.4 pg (ref 26.0–34.0)
MCHC: 34 g/dL (ref 30.0–36.0)
MCV: 95.1 fL (ref 80.0–100.0)
Platelets: 207 10*3/uL (ref 150–400)
RBC: 4.48 MIL/uL (ref 3.87–5.11)
RDW: 12.4 % (ref 11.5–15.5)
WBC: 5.3 10*3/uL (ref 4.0–10.5)
nRBC: 0 % (ref 0.0–0.2)

## 2023-07-12 LAB — BASIC METABOLIC PANEL WITH GFR
Anion gap: 7 (ref 5–15)
BUN: 15 mg/dL (ref 8–23)
CO2: 28 mmol/L (ref 22–32)
Calcium: 9.5 mg/dL (ref 8.9–10.3)
Chloride: 99 mmol/L (ref 98–111)
Creatinine, Ser: 1.11 mg/dL — ABNORMAL HIGH (ref 0.44–1.00)
GFR, Estimated: 48 mL/min — ABNORMAL LOW (ref >60)
Glucose, Bld: 88 mg/dL (ref 70–99)
Potassium: 4.4 mmol/L (ref 3.5–5.1)
Sodium: 134 mmol/L — ABNORMAL LOW (ref 135–145)

## 2023-07-12 MED ORDER — INFLUENZA VAC A&B SURF ANT ADJ 0.5 ML IM SUSY
0.5000 mL | PREFILLED_SYRINGE | INTRAMUSCULAR | Status: AC
  Administered 2023-07-13: 0.5 mL via INTRAMUSCULAR
  Filled 2023-07-12 (×4): qty 0.5

## 2023-07-12 MED ORDER — PNEUMOCOCCAL 20-VAL CONJ VACC 0.5 ML IM SUSY
0.5000 mL | PREFILLED_SYRINGE | INTRAMUSCULAR | Status: AC
  Administered 2023-07-13: 0.5 mL via INTRAMUSCULAR
  Filled 2023-07-12: qty 0.5

## 2023-07-12 NOTE — Plan of Care (Signed)
 ?  Problem: Clinical Measurements: ?Goal: Will remain free from infection ?Outcome: Progressing ?  ?Problem: Clinical Measurements: ?Goal: Diagnostic test results will improve ?Outcome: Progressing ?  ?

## 2023-07-12 NOTE — Plan of Care (Signed)
  Problem: Clinical Measurements: Goal: Diagnostic test results will improve Outcome: Progressing   Problem: Activity: Goal: Risk for activity intolerance will decrease Outcome: Progressing   Problem: Coping: Goal: Level of anxiety will decrease Outcome: Progressing   Problem: Pain Managment: Goal: General experience of comfort will improve and/or be controlled Outcome: Progressing   Problem: Safety: Goal: Ability to remain free from injury will improve Outcome: Progressing   Problem: Skin Integrity: Goal: Risk for impaired skin integrity will decrease Outcome: Progressing

## 2023-07-12 NOTE — Progress Notes (Signed)
 Progress Note   Patient: Karla Alexander  [name] EAV:[id number] DOB: 1933/03/02 DOA: 07/11/2023     0 DOS: the patient was seen and examined on 07/12/2023   Brief hospital course: Mr. Karla Alexander  Alexander is an 89 year old female with history of CKD stage IIIa, dementia, hypertension, hypothyroid, neuropathy, restless leg syndrome, GERD, who presents to the emergency department for chief concerns of melena stool for 2 weeks.  Patient was sent from outpatient PCP at group home.  Per nursing documentation, MD had been trying to manage the symptoms outpatient until today.  Vitals in the ED showed temperature of 98.7, respiration rate of 25, heart rate of 64, blood pressure 146/64, SpO2 of 96% on room air.  Serum sodium is 132, potassium 4.4, chloride 99, bicarb 26, BUN of 19, serum creatinine 1.07, EGFR 50, nonfasting blood glucose 108, WBC 4.8, hemoglobin 15.3, platelets of 216.  CT angio GI bleed done in the ED: Read as large up to 9 cm exophytic right renal cell carcinoma, mildly increased since December.  No active GI bleeding identified by multiphase CTA.  Distal large bowel diverticulosis consider diverticular bleed source if there is bright red blood per rectum.  No bowel obstruction or active inflammation.  ED treatment: Protonix  40 mg IV one-time dose, sodium chloride  500 mL liter bolus.  Assessment and Plan: * Dark stools - Hgb today 14.5  - Diet advanced to regular 07/12/2023  Chest pain - Troponin wnl x 2 (7 & 8)  Restless leg syndrome Home ropinirole  1 mg nightly resumed  Right renal mass Per CT read concerning for right renal cell carcinoma Patient follows with outpatient oncology Patient will need to follow-up with Karla Alexander Alexander and urology outpatient as appropriate  Recurrent major depression in remission (HCC) Home sertraline  75 mg resumed  Hypertension Home amlodipine  5 mg daily, Imdur  30 mg daily, metoprolol  tartrate 75 mg p.o. twice daily resumed Hydralazine  5 mg IV every 6 hours  as needed for SBP greater 170, 5 days ordered  Hypothyroidism Home levothyroxine  112 mcg daily resumed  Back pain - Tylenol  PRN  - Gabapentin  200 mg PO tid  - Lidocaine  5% patch PRN       Subjective: Pt seen and examined at the bedside. Attempted to discharge pt today but she then advised she was having back pain (would prefer to stay today). Hgb is 14 today. Diet advanced to solid today.  Physical Exam: Vitals:   07/11/23 2118 07/12/23 0331 07/12/23 0701 07/12/23 0716  BP: (!) 146/64 135/64 130/60   Pulse: (!) 57 (!) 57 (!) 59 (!) 57  Resp:  17 16   Temp:  98 F (36.7 C) 98.6 F (37 C)   TempSrc:   Oral   SpO2:  94% 97%   Weight:      Height:       Physical Exam HENT:     Head: Normocephalic.  Cardiovascular:     Rate and Rhythm: Bradycardia present.  Pulmonary:     Effort: Pulmonary effort is normal.  Abdominal:     General: Abdomen is flat.     Palpations: Abdomen is soft.  Skin:    General: Skin is warm.  Neurological:     Mental Status: She is alert.      Disposition: Status is: Observation The patient remains OBS appropriate and will d/c before 2 midnights.  Planned Discharge Destination: Home    Time spent: 35 minutes  Author: Hulon Ferron , MD 07/12/2023 2:23 PM  For on call review [email or web address].

## 2023-07-13 ENCOUNTER — Observation Stay

## 2023-07-13 DIAGNOSIS — K921 Melena: Secondary | ICD-10-CM | POA: Diagnosis not present

## 2023-07-13 DIAGNOSIS — R195 Other fecal abnormalities: Secondary | ICD-10-CM | POA: Diagnosis not present

## 2023-07-13 LAB — CBC
HCT: 42.9 % (ref 36.0–46.0)
Hemoglobin: 14.6 g/dL (ref 12.0–15.0)
MCH: 32.6 pg (ref 26.0–34.0)
MCHC: 34 g/dL (ref 30.0–36.0)
MCV: 95.8 fL (ref 80.0–100.0)
Platelets: 199 10*3/uL (ref 150–400)
RBC: 4.48 MIL/uL (ref 3.87–5.11)
RDW: 12.4 % (ref 11.5–15.5)
WBC: 6.4 10*3/uL (ref 4.0–10.5)
nRBC: 0 % (ref 0.0–0.2)

## 2023-07-13 LAB — COMPREHENSIVE METABOLIC PANEL WITH GFR
ALT: 19 U/L (ref 0–44)
AST: 20 U/L (ref 15–41)
Albumin: 4 g/dL (ref 3.5–5.0)
Alkaline Phosphatase: 59 U/L (ref 38–126)
Anion gap: 9 (ref 5–15)
BUN: 14 mg/dL (ref 8–23)
CO2: 23 mmol/L (ref 22–32)
Calcium: 9.2 mg/dL (ref 8.9–10.3)
Chloride: 93 mmol/L — ABNORMAL LOW (ref 98–111)
Creatinine, Ser: 1.06 mg/dL — ABNORMAL HIGH (ref 0.44–1.00)
GFR, Estimated: 51 mL/min — ABNORMAL LOW (ref >60)
Glucose, Bld: 87 mg/dL (ref 70–99)
Potassium: 4.1 mmol/L (ref 3.5–5.1)
Sodium: 125 mmol/L — ABNORMAL LOW (ref 135–145)
Total Bilirubin: 0.9 mg/dL (ref 0.0–1.2)
Total Protein: 6.5 g/dL (ref 6.5–8.1)

## 2023-07-13 LAB — MAGNESIUM: Magnesium: 1.7 mg/dL (ref 1.7–2.4)

## 2023-07-13 MED ORDER — BISACODYL 5 MG PO TBEC
5.0000 mg | DELAYED_RELEASE_TABLET | Freq: Every day | ORAL | Status: DC | PRN
  Administered 2023-07-14: 5 mg via ORAL
  Filled 2023-07-13 (×2): qty 1

## 2023-07-13 MED ORDER — IPRATROPIUM-ALBUTEROL 0.5-2.5 (3) MG/3ML IN SOLN
3.0000 mL | Freq: Four times a day (QID) | RESPIRATORY_TRACT | Status: DC
  Administered 2023-07-13 – 2023-07-14 (×2): 3 mL via RESPIRATORY_TRACT
  Filled 2023-07-13 (×2): qty 3

## 2023-07-13 MED ORDER — PANTOPRAZOLE SODIUM 40 MG PO TBEC
40.0000 mg | DELAYED_RELEASE_TABLET | Freq: Two times a day (BID) | ORAL | Status: DC
  Administered 2023-07-13 – 2023-07-15 (×4): 40 mg via ORAL
  Filled 2023-07-13 (×4): qty 1

## 2023-07-13 MED ORDER — METHYLPREDNISOLONE SODIUM SUCC 125 MG IJ SOLR
125.0000 mg | Freq: Once | INTRAMUSCULAR | Status: AC
  Administered 2023-07-13: 125 mg via INTRAVENOUS
  Filled 2023-07-13: qty 2

## 2023-07-13 MED ORDER — FUROSEMIDE 10 MG/ML IJ SOLN
40.0000 mg | Freq: Once | INTRAMUSCULAR | Status: AC
  Administered 2023-07-13: 40 mg via INTRAVENOUS
  Filled 2023-07-13: qty 4

## 2023-07-13 MED ORDER — SODIUM CHLORIDE 1 G PO TABS
1.0000 g | ORAL_TABLET | Freq: Three times a day (TID) | ORAL | Status: DC
  Administered 2023-07-13 – 2023-07-15 (×7): 1 g via ORAL
  Filled 2023-07-13 (×7): qty 1

## 2023-07-13 MED ORDER — DOCUSATE SODIUM 100 MG PO CAPS
100.0000 mg | ORAL_CAPSULE | Freq: Two times a day (BID) | ORAL | Status: DC
  Administered 2023-07-13 – 2023-07-15 (×5): 100 mg via ORAL
  Filled 2023-07-13 (×5): qty 1

## 2023-07-13 MED ORDER — MAGNESIUM SULFATE 2 GM/50ML IV SOLN
2.0000 g | Freq: Once | INTRAVENOUS | Status: AC
  Administered 2023-07-13: 2 g via INTRAVENOUS
  Filled 2023-07-13: qty 50

## 2023-07-13 MED ORDER — SUCRALFATE 1 G PO TABS
1.0000 g | ORAL_TABLET | Freq: Three times a day (TID) | ORAL | Status: DC
  Administered 2023-07-13 – 2023-07-15 (×9): 1 g via ORAL
  Filled 2023-07-13 (×9): qty 1

## 2023-07-13 MED ORDER — IPRATROPIUM-ALBUTEROL 0.5-2.5 (3) MG/3ML IN SOLN
3.0000 mL | Freq: Four times a day (QID) | RESPIRATORY_TRACT | Status: DC | PRN
  Administered 2023-07-13: 3 mL via RESPIRATORY_TRACT
  Filled 2023-07-13: qty 3

## 2023-07-13 MED ORDER — BUDESONIDE 0.25 MG/2ML IN SUSP
0.2500 mg | Freq: Two times a day (BID) | RESPIRATORY_TRACT | Status: DC
  Administered 2023-07-13 – 2023-07-15 (×4): 0.25 mg via RESPIRATORY_TRACT
  Filled 2023-07-13 (×4): qty 2

## 2023-07-13 MED ORDER — POLYETHYLENE GLYCOL 3350 17 G PO PACK
17.0000 g | PACK | Freq: Every day | ORAL | Status: DC
  Administered 2023-07-13 – 2023-07-15 (×3): 17 g via ORAL
  Filled 2023-07-13 (×3): qty 1

## 2023-07-13 MED ORDER — METHYLPREDNISOLONE SODIUM SUCC 125 MG IJ SOLR: 125.0000 mg | Freq: Once | INTRAMUSCULAR | Status: DC

## 2023-07-13 NOTE — Progress Notes (Signed)
 Progress Note   Patient: Karla Alexander:[id number] DOB: [birth year] DOA: 07/11/2023     0 DOS: the patient was seen and examined on 07/13/2023   Brief hospital course: 89yo with h/o CKD stage IIIa, dementia, hypertension, hypothyroid, neuropathy, restless leg syndrome, and GERD who presented on 4/20 with melena stool for 2 weeks.  CTA with 9 cm exophytic right renal cell carcinoma, mildly increased since December; no active GI bleeding identified, consider diverticular bleed.  She was started on Protonix .  Hgb stable.  Assessment and Plan:  Dark stools Hgb stable Diet advanced to regular 07/12/2023 without difficulty Continue Protonix , change to PO Add Carafate   Hyponatremia Appears to be chronic since 02/2023 Worse now than on admission Will give PO salt tabs TID x 3 days   Chest pain Troponin negative x 2 Resolved   Restless leg syndrome Continue ropinirole    Right renal mass CT suggestive of right renal cell carcinoma Patient follows with outpatient oncology Patient will need to follow-up with Aris Bel and urology outpatient as appropriate   Recurrent major depression in remission  Continue sertraline  75 mg    Hypertension Continue amlodipine , Imdur , metoprolol  Hydralazine  IV prn   Hypothyroidism Continue Synthroid    Back pain Tylenol  PRN  Gabapentin  200 mg PO tid  Lidocaine  5% patch PRN        Consultants: None   Procedures: None   Antibiotics: None        Subjective: Some epigastric pain.  Has not had a BM in 2 days.  No acute concerns, wants to go home ASAP.   Objective: Vitals:   07/13/23 0447 07/13/23 0705  BP: (!) 161/64 132/68  Pulse: 60 60  Resp: 17 18  Temp: 98.3 F (36.8 C) 98.2 F (36.8 C)  SpO2: 92% 94%    Intake/Output Summary (Last 24 hours) at 07/13/2023 1435 Last data filed at 07/13/2023 0800 Gross per 24 hour  Intake 640 ml  Output --  Net 640 ml   Filed Weights   07/11/23 0951  Weight: 69.2 kg     Exam:  General:  Appears calm and comfortable and is in NAD, sitting up at the bedside, fully dressed Eyes:  EOMI, normal lids, iris ENT:  grossly normal hearing, lips & tongue, mmm Cardiovascular:  RRR, no m/r/g. No LE edema.  Respiratory:   CTA bilaterally with no wheezes/rales/rhonchi.  Normal respiratory effort. Abdomen:  soft, +epigastric TTP, ND Skin:  no rash or induration seen on limited exam Musculoskeletal:  grossly normal tone BUE/BLE, good ROM, no bony abnormality Psychiatric:  grossly normal mood and affect, speech fluent and appropriate, AOx3 Neurologic:  CN 2-12 grossly intact, moves all extremities in coordinated fashion    Data Reviewed: I have reviewed the patient's lab results since admission.  Pertinent labs for today include:   Na++ 125, down from 134; 127 on 12/3 BUN 14/Creatinine 1.06/GFR 51, stable Normal CBC     Family Communication: None present; anticipate dc tomorrow if Na++ is stable/improved  Disposition: Status is: Observation The patient remains OBS appropriate and will d/c before 2 midnights.     Time spent: 50 minutes  Unresulted Labs (From admission, onward)     Start     Ordered   07/14/23 0500  CBC with Differential/Platelet  Tomorrow morning,   R        07/13/23 1435   07/14/23 0500  Basic metabolic panel with GFR  Tomorrow morning,   R        07/13/23 1435  Author: Lorita Rosa, MD 07/13/2023 2:35 PM  For on call review [email or web address].

## 2023-07-13 NOTE — Plan of Care (Signed)
  Problem: Education: Goal: Knowledge of General Education information will improve Description: Including pain rating scale, medication(s)/side effects and non-pharmacologic comfort measures Outcome: Progressing   Problem: Clinical Measurements: Goal: Diagnostic test results will improve Outcome: Progressing   Problem: Activity: Goal: Risk for activity intolerance will decrease Outcome: Progressing   Problem: Nutrition: Goal: Adequate nutrition will be maintained Outcome: Progressing   Problem: Coping: Goal: Level of anxiety will decrease Outcome: Progressing   Problem: Pain Managment: Goal: General experience of comfort will improve and/or be controlled Outcome: Progressing   Problem: Safety: Goal: Ability to remain free from injury will improve Outcome: Progressing

## 2023-07-13 NOTE — Care Management Obs Status (Signed)
 MEDICARE OBSERVATION STATUS NOTIFICATION   Patient Details  Name: Karla Alexander MRN: [id number] Date of Birth: [birth year]   Medicare Observation Status Notification Given:  Rudolph Cost, CMA 07/13/2023, 11:21 AM

## 2023-07-13 NOTE — Care Management (Addendum)
 Pt was not alert and oriented when I went in to discuss MOON letter with her on 4.21.25.  Attempted to call her legal guardian, Otho Blitz at [phone] left a message requesting a return call.  I did the same thing again this morning 4.22.25 with no response.

## 2023-07-13 NOTE — Plan of Care (Signed)
   Problem: Clinical Measurements: Goal: Will remain free from infection Outcome: Progressing   Problem: Clinical Measurements: Goal: Respiratory complications will improve Outcome: Progressing

## 2023-07-14 DIAGNOSIS — R195 Other fecal abnormalities: Secondary | ICD-10-CM | POA: Diagnosis not present

## 2023-07-14 DIAGNOSIS — K921 Melena: Secondary | ICD-10-CM | POA: Diagnosis not present

## 2023-07-14 LAB — CBC WITH DIFFERENTIAL/PLATELET
Abs Immature Granulocytes: 0.02 10*3/uL (ref 0.00–0.07)
Basophils Absolute: 0 10*3/uL (ref 0.0–0.1)
Basophils Relative: 0 %
Eosinophils Absolute: 0 10*3/uL (ref 0.0–0.5)
Eosinophils Relative: 0 %
HCT: 44 % (ref 36.0–46.0)
Hemoglobin: 15.4 g/dL — ABNORMAL HIGH (ref 12.0–15.0)
Immature Granulocytes: 0 %
Lymphocytes Relative: 7 %
Lymphs Abs: 0.4 10*3/uL — ABNORMAL LOW (ref 0.7–4.0)
MCH: 32.4 pg (ref 26.0–34.0)
MCHC: 35 g/dL (ref 30.0–36.0)
MCV: 92.6 fL (ref 80.0–100.0)
Monocytes Absolute: 0.1 10*3/uL (ref 0.1–1.0)
Monocytes Relative: 1 %
Neutro Abs: 5.5 10*3/uL (ref 1.7–7.7)
Neutrophils Relative %: 92 %
Platelets: 193 10*3/uL (ref 150–400)
RBC: 4.75 MIL/uL (ref 3.87–5.11)
RDW: 12 % (ref 11.5–15.5)
WBC: 6 10*3/uL (ref 4.0–10.5)
nRBC: 0 % (ref 0.0–0.2)

## 2023-07-14 LAB — BASIC METABOLIC PANEL WITH GFR
Anion gap: 10 (ref 5–15)
BUN: 17 mg/dL (ref 8–23)
CO2: 23 mmol/L (ref 22–32)
Calcium: 9.4 mg/dL (ref 8.9–10.3)
Chloride: 93 mmol/L — ABNORMAL LOW (ref 98–111)
Creatinine, Ser: 1.15 mg/dL — ABNORMAL HIGH (ref 0.44–1.00)
GFR, Estimated: 46 mL/min — ABNORMAL LOW (ref >60)
Glucose, Bld: 162 mg/dL — ABNORMAL HIGH (ref 70–99)
Potassium: 3.6 mmol/L (ref 3.5–5.1)
Sodium: 126 mmol/L — ABNORMAL LOW (ref 135–145)

## 2023-07-14 LAB — SODIUM, URINE, RANDOM: Sodium, Ur: 70 mmol/L

## 2023-07-14 LAB — TSH: TSH: 4.923 u[IU]/mL — ABNORMAL HIGH (ref 0.350–4.500)

## 2023-07-14 LAB — BRAIN NATRIURETIC PEPTIDE: B Natriuretic Peptide: 329 pg/mL — ABNORMAL HIGH (ref 0.0–100.0)

## 2023-07-14 MED ORDER — IPRATROPIUM-ALBUTEROL 0.5-2.5 (3) MG/3ML IN SOLN
3.0000 mL | Freq: Three times a day (TID) | RESPIRATORY_TRACT | Status: DC
  Administered 2023-07-14 – 2023-07-15 (×3): 3 mL via RESPIRATORY_TRACT
  Filled 2023-07-14 (×3): qty 3

## 2023-07-14 NOTE — TOC Progression Note (Addendum)
 Transition of Care [facility]) - Progression Note    Patient Details  Name: Karla Alexander MRN: [id number] Date of Birth: Jul 12, 1932  Transition of Care [employer]) CM/SW Contact  Crayton Docker, RN Phone Number: 07/14/2023, 12:12 PM  Clinical Narrative:     Alert received from Dr. Murrel Arnt, patient will need a youth rolling walker. Patient is from [facility]. CM call to owner, Mr. Marcello Sero regarding transportation at discharge when patient is medically ready to discharge. CM call to Alejos Husband, phone: [phone] regarding youth rolling walker and delivery to bedside. Per Harriet Limber, will process order for delivery to bedside. Per Sam Creighton, [employer] delivery tech, rolling walker delivery at 1105.    Alert received from Dr. Murrel Arnt, will discharge patient today. CM call to [name] regarding transportation. No answer and voicemail box is full. CM sent secure message for call back. CM alert sent to Dr. Murrel Arnt and RN Trevor Fudge.     Expected Discharge Plan and Services    Return to prior facility.                DME Agency: [employer] Date DME Agency Contacted: 07/14/23 Time DME Agency Contacted: [phone] Representative spoke with at DME Agency: Mitch     Social Determinants of Health (SDOH) Interventions SDOH Screenings   Food Insecurity: No Food Insecurity (07/11/2023)  Housing: Low Risk  (07/11/2023)  Transportation Needs: No Transportation Needs (07/11/2023)  Utilities: Not At Risk (07/11/2023)  Depression (PHQ2-9): Low Risk  (02/23/2023)  Social Connections: Socially Isolated (07/11/2023)  Tobacco Use: Low Risk  (07/11/2023)    Readmission Risk Interventions     No data to display

## 2023-07-14 NOTE — Progress Notes (Addendum)
     CROSS COVER NOTE  NAME: Karla Alexander MRN: [id number] DOB : 09-Jul-1932 ATTENDING PHYSICIAN: Lorita Rosa, MD    Date of Service   07/13/2023  HPI/Events of Note   Notified by patient's RN that patient has developed significant shortness of breath associated with wheezing and mild increased work of breathing. On bedside exam, she was noted with mild work of breathing, diffuse wheezing and crackles bilaterally. VS stable.  Interventions   Assessment/Plan: 1. Shortness of breath associated with wheezing and mild increase work of breathing. Xray concerning for pulmonary edema. Received IV Lasix  40mg  x 1, solumedrol 125mg  x 1, and Duonebs with significant improvement. Follow respiratory status 2. Hyponatremia, unclear etiology but CT concerning for right renal cell carcinoma (RCC) which can be the cause of hyponatremia due to several factors, including the syndrome of inappropriate antidiuretic hormone secretion (SIADH) caused by the tumor itself. Follow Osmolality, serial sodium, TSH and neuro status.         Alonza Arthurs, DNP, CCRN, FNP-C, AGACNP-BC Acute Care & Family Nurse Practitioner  [facility]  See Amion for personal pager PCCM on call pager [phone] until 7 am

## 2023-07-14 NOTE — Progress Notes (Signed)
 Pt ambulated around the nurses station x2 with one assist and the walker. Pt is requesting a walker for home.

## 2023-07-14 NOTE — Progress Notes (Signed)
 Progress Note   Patient: Karla Alexander:[id number] DOB: [birth year] DOA: 07/11/2023     0 DOS: the patient was seen and examined on 07/14/2023   Brief hospital course: 89yo with h/o CKD stage IIIa, dementia, hypertension, hypothyroid, neuropathy, restless leg syndrome, and GERD who presented on 4/20 with melena stool for 2 weeks.  CTA with 9 cm exophytic right renal cell carcinoma, mildly increased since December; no active GI bleeding identified, consider diverticular bleed.  She was started on Protonix .  Hgb stable.  Assessment and Plan:  Dark stools Hgb stable Diet advanced to regular 07/12/2023 without difficulty Continue Protonix , changed to PO Added Carafate  Only a small BM on 4/21, none since - will add enema   Hyponatremia Appears to be chronic since 02/2023 Dropped on 4/22 Giving PO salt tabs TID x 3 days Slight improvement today, anticipate ongoing improvement with salt tabs Will need outpatient f/u within 1 week   Restless leg syndrome Continue ropinirole    Right renal mass CT suggestive of right renal cell carcinoma Patient follows with outpatient oncology Patient will need to follow-up with Aris Bel and urology outpatient as appropriate   Recurrent major depression in remission  Continue sertraline  75 mg    Hypertension Continue amlodipine , Imdur , metoprolol  Hydralazine  IV prn   Hypothyroidism Continue Synthroid    Back pain Tylenol  PRN  Gabapentin  200 mg PO tid  Lidocaine  5% patch PRN        Consultants: None   Procedures: None   Antibiotics: None      Subjective: Feeling ok.  Not dressed and ready to go today.  Wants to have a BM.   Objective: Vitals:   07/14/23 1447 07/14/23 1520  BP:  (!) 146/55  Pulse:  62  Resp:  16  Temp:  97.9 F (36.6 C)  SpO2: 98% 97%    Intake/Output Summary (Last 24 hours) at 07/14/2023 1715 Last data filed at 07/14/2023 1416 Gross per 24 hour  Intake 360 ml  Output 300 ml  Net 60 ml   Filed  Weights   07/11/23 0951  Weight: 69.2 kg    Exam:  General:  Appears calm and comfortable and is in NAD Eyes:  EOMI, normal lids, iris ENT:  grossly normal hearing, lips & tongue, mmm Cardiovascular:  RRR, no m/r/g. No LE edema.  Respiratory:   CTA bilaterally with no wheezes/rales/rhonchi.  Normal respiratory effort. Abdomen:  soft, +epigastric TTP, ND Skin:  no rash or induration seen on limited exam Musculoskeletal:  grossly normal tone BUE/BLE, good ROM, no bony abnormality Psychiatric:  grossly normal mood and affect, speech fluent and appropriate, AOx3 Neurologic:  CN 2-12 grossly intact, moves all extremities in coordinated fashion  Data Reviewed: I have reviewed the patient's lab results since admission.  Pertinent labs for today include:   Na++ 126, up from 125 Glucose 162 BUN 17/Creatinine 1.15/GFR 46, stable BNP 329 Osm 273 Stable CBC TSH 4.923 Urine Na++ 70     Family Communication: None present  Disposition: Status is: Observation The patient remains OBS appropriate and will d/c before 2 midnights.     Time spent: 35 minutes  Unresulted Labs (From admission, onward)     Start     Ordered   07/15/23 0500  CBC with Differential/Platelet  Tomorrow morning,   R        07/14/23 1715   07/15/23 0500  Basic metabolic panel with GFR  Tomorrow morning,   R        07/14/23 1715  Author: Lorita Rosa, MD 07/14/2023 5:15 PM  For on call review [email or web address].

## 2023-07-14 NOTE — TOC Transition Note (Signed)
 Transition of Care [facility]) - Discharge Note   Patient Details  Name: Karla Alexander  NAKEDA LEBRON MRN: [id number] Date of Birth: 11/07/1932  Transition of Care [facility]) CM/SW Contact:  Crayton Docker, RN Phone Number: 07/14/2023, 5:10 PM   Clinical Narrative:     Alert received from Dr. Murrel Arnt, will place discharge orders due to improvement in sodium. CM call to Mr. Richardean Chancellor of [facility], phone: [phone]. No answer, voicemail full. CM sent SMS for call back. CM alert to Dr. Murrel Arnt and RN Trevor Fudge regarding CM unable to contact [name] and provided call back number for follow up. Patient has rolling walker.         Patient Goals and CMS Choice      Return to prior facility     Discharge Placement     Return to prior facility       Discharge Plan and Services Additional resources added to the After Visit Summary for      Return to Group Home.               DME Agency: [employer] Date DME Agency Contacted: 07/14/23 Time DME Agency Contacted: [phone] Representative spoke with at DME Agency: Mitch   Social Drivers of Health (SDOH) Interventions SDOH Screenings   Food Insecurity: No Food Insecurity (07/11/2023)  Housing: Low Risk  (07/11/2023)  Transportation Needs: No Transportation Needs (07/11/2023)  Utilities: Not At Risk (07/11/2023)  Depression (PHQ2-9): Low Risk  (02/23/2023)  Social Connections: Socially Isolated (07/11/2023)  Tobacco Use: Low Risk  (07/11/2023)     Readmission Risk Interventions     No data to display

## 2023-07-14 NOTE — Evaluation (Signed)
 Physical Therapy Evaluation Patient Details Name: Karla Alexander MRN: [id number] DOB: 06-Dec-1932 Today's Date: 07/14/2023  History of Present Illness  Karla Alexander is an 89 year old female with history of CKD stage IIIa, dementia, hypertension, hypothyroid, neuropathy, restless leg syndrome, GERD, who presents to the emergency department for chief concerns of melena stool for 2 weeks.  Clinical Impression  Patient received in bed, she is pleasant and agrees to PT assessment. Patient is independent with bed mobility and transfers with supervision. Patient ambulated around nursing station with RW and cga. No lob or difficulty noted. Patient is able to ambulate short distance in room without AD. She will continue to benefit from mobility while here to maintain strength. No skilled PT needs identified at this time. Signing off.         If plan is discharge home, recommend the following: Help with stairs or ramp for entrance;Assist for transportation   Can travel by private vehicle    yes    Equipment Recommendations Rolling walker (2 wheels) youth  Recommendations for Other Services       Functional Status Assessment Patient has not had a recent decline in their functional status     Precautions / Restrictions Precautions Precautions: Fall Recall of Precautions/Restrictions: Intact Restrictions Weight Bearing Restrictions Per Provider Order: No      Mobility  Bed Mobility Overal bed mobility: Independent                  Transfers Overall transfer level: Independent Equipment used: Rolling walker (2 wheels)                    Ambulation/Gait Ambulation/Gait assistance: Supervision Gait Distance (Feet): 150 Feet Assistive device: Rolling walker (2 wheels) Gait Pattern/deviations: Step-through pattern Gait velocity: slightly decreased     General Gait Details: generally safe with ambulation using RW. Able to walk short distances without  RW  Stairs            Wheelchair Mobility     Tilt Bed    Modified Rankin (Stroke Patients Only)       Balance Overall balance assessment: Modified Independent                                           Pertinent Vitals/Pain Pain Assessment Pain Assessment: No/denies pain    Home Living Family/patient expects to be discharged to:: Group home Living Arrangements: Group Home               Home Equipment: None      Prior Function Prior Level of Function : Independent/Modified Independent             Mobility Comments: not using AD prior to admission ADLs Comments: independent     Extremity/Trunk Assessment   Upper Extremity Assessment Upper Extremity Assessment: Overall WFL for tasks assessed    Lower Extremity Assessment Lower Extremity Assessment: Overall WFL for tasks assessed    Cervical / Trunk Assessment Cervical / Trunk Assessment: Normal  Communication   Communication Communication: No apparent difficulties    Cognition Arousal: Alert Behavior During Therapy: WFL for tasks assessed/performed   PT - Cognitive impairments: No apparent impairments                         Following commands: Intact       Cueing  Cueing Techniques: Verbal cues     General Comments      Exercises     Assessment/Plan    PT Assessment Patient does not need any further PT services  PT Problem List         PT Treatment Interventions      PT Goals (Current goals can be found in the Care Plan section)  Acute Rehab PT Goals Patient Stated Goal: return home PT Goal Formulation: With patient Time For Goal Achievement: 07/17/23 Potential to Achieve Goals: Good    Frequency       Co-evaluation               AM-PAC PT "6 Clicks" Mobility  Outcome Measure Help needed turning from your back to your side while in a flat bed without using bedrails?: None Help needed moving from lying on your back to sitting  on the side of a flat bed without using bedrails?: None Help needed moving to and from a bed to a chair (including a wheelchair)?: None Help needed standing up from a chair using your arms (e.g., wheelchair or bedside chair)?: None Help needed to walk in hospital room?: None Help needed climbing 3-5 steps with a railing? : None 6 Click Score: 24    End of Session Equipment Utilized During Treatment: Gait belt Activity Tolerance: Patient tolerated treatment well Patient left: in chair;with call bell/phone within reach;with chair alarm set Nurse Communication: Mobility status      Time: 4098-1191 PT Time Calculation (min) (ACUTE ONLY): 12 min   Charges:   PT Evaluation $PT Eval Low Complexity: 1 Low   PT General Charges $$ ACUTE PT VISIT: 1 Visit         Daizy Outen, PT, GCS 07/14/23,9:22 AM

## 2023-07-14 NOTE — Plan of Care (Signed)
   Problem: Clinical Measurements: Goal: Will remain free from infection Outcome: Progressing Goal: Respiratory complications will improve Outcome: Progressing

## 2023-07-14 NOTE — Plan of Care (Signed)
  Problem: Education: Goal: Knowledge of General Education information will improve Description: Including pain rating scale, medication(s)/side effects and non-pharmacologic comfort measures Outcome: Progressing   Problem: Clinical Measurements: Goal: Ability to maintain clinical measurements within normal limits will improve Outcome: Progressing Goal: Cardiovascular complication will be avoided Outcome: Progressing   Problem: Activity: Goal: Risk for activity intolerance will decrease Outcome: Progressing   Problem: Nutrition: Goal: Adequate nutrition will be maintained Outcome: Progressing   Problem: Coping: Goal: Level of anxiety will decrease Outcome: Progressing   Problem: Elimination: Goal: Will not experience complications related to bowel motility Outcome: Progressing Goal: Will not experience complications related to urinary retention Outcome: Progressing   Problem: Pain Managment: Goal: General experience of comfort will improve and/or be controlled Outcome: Progressing   Problem: Safety: Goal: Ability to remain free from injury will improve Outcome: Progressing   Problem: Skin Integrity: Goal: Risk for impaired skin integrity will decrease Outcome: Progressing

## 2023-07-14 NOTE — Plan of Care (Signed)

## 2023-07-15 DIAGNOSIS — R195 Other fecal abnormalities: Secondary | ICD-10-CM | POA: Diagnosis not present

## 2023-07-15 DIAGNOSIS — K921 Melena: Secondary | ICD-10-CM | POA: Diagnosis not present

## 2023-07-15 LAB — BASIC METABOLIC PANEL WITH GFR
Anion gap: 11 (ref 5–15)
BUN: 24 mg/dL — ABNORMAL HIGH (ref 8–23)
CO2: 26 mmol/L (ref 22–32)
Calcium: 9.5 mg/dL (ref 8.9–10.3)
Chloride: 90 mmol/L — ABNORMAL LOW (ref 98–111)
Creatinine, Ser: 1.18 mg/dL — ABNORMAL HIGH (ref 0.44–1.00)
GFR, Estimated: 44 mL/min — ABNORMAL LOW (ref >60)
Glucose, Bld: 92 mg/dL (ref 70–99)
Potassium: 3.7 mmol/L (ref 3.5–5.1)
Sodium: 127 mmol/L — ABNORMAL LOW (ref 135–145)

## 2023-07-15 LAB — CBC WITH DIFFERENTIAL/PLATELET
Abs Immature Granulocytes: 0.03 10*3/uL (ref 0.00–0.07)
Basophils Absolute: 0 10*3/uL (ref 0.0–0.1)
Basophils Relative: 0 %
Eosinophils Absolute: 0.1 10*3/uL (ref 0.0–0.5)
Eosinophils Relative: 1 %
HCT: 45.5 % (ref 36.0–46.0)
Hemoglobin: 15.4 g/dL — ABNORMAL HIGH (ref 12.0–15.0)
Immature Granulocytes: 0 %
Lymphocytes Relative: 9 %
Lymphs Abs: 0.8 10*3/uL (ref 0.7–4.0)
MCH: 32.2 pg (ref 26.0–34.0)
MCHC: 33.8 g/dL (ref 30.0–36.0)
MCV: 95 fL (ref 80.0–100.0)
Monocytes Absolute: 0.9 10*3/uL (ref 0.1–1.0)
Monocytes Relative: 10 %
Neutro Abs: 7 10*3/uL (ref 1.7–7.7)
Neutrophils Relative %: 80 %
Platelets: 221 10*3/uL (ref 150–400)
RBC: 4.79 MIL/uL (ref 3.87–5.11)
RDW: 12.3 % (ref 11.5–15.5)
WBC: 8.7 10*3/uL (ref 4.0–10.5)
nRBC: 0 % (ref 0.0–0.2)

## 2023-07-15 MED ORDER — ISOSORBIDE MONONITRATE 10 MG PO TABS: 30.0000 mg | ORAL_TABLET | Freq: Every day | ORAL | 1 refills | Status: AC

## 2023-07-15 MED ORDER — SUCRALFATE 1 G PO TABS: 1.0000 g | ORAL_TABLET | Freq: Three times a day (TID) | ORAL | 0 refills | Status: AC

## 2023-07-15 MED ORDER — PANTOPRAZOLE SODIUM 40 MG PO TBEC: 40.0000 mg | DELAYED_RELEASE_TABLET | Freq: Two times a day (BID) | ORAL | 1 refills | Status: DC

## 2023-07-15 MED ORDER — FUROSEMIDE 20 MG PO TABS: 20.0000 mg | ORAL_TABLET | Freq: Every day | ORAL | 0 refills | Status: AC | PRN

## 2023-07-15 MED ORDER — POLYETHYLENE GLYCOL 3350 17 G PO PACK: 17.0000 g | PACK | Freq: Every day | ORAL | 0 refills | Status: DC

## 2023-07-15 MED ORDER — SODIUM CHLORIDE 1 G PO TABS: 1.0000 g | ORAL_TABLET | Freq: Three times a day (TID) | ORAL | 0 refills | Status: DC

## 2023-07-15 MED ORDER — IPRATROPIUM-ALBUTEROL 0.5-2.5 (3) MG/3ML IN SOLN: 3.0000 mL | Freq: Two times a day (BID) | RESPIRATORY_TRACT | Status: DC

## 2023-07-15 MED ORDER — DOCUSATE SODIUM 100 MG PO CAPS: 100.0000 mg | ORAL_CAPSULE | Freq: Two times a day (BID) | ORAL | 0 refills | Status: AC

## 2023-07-15 NOTE — Plan of Care (Signed)

## 2023-07-15 NOTE — TOC Transition Note (Signed)
 Transition of Care [facility]) - Discharge Note   Patient Details  Name: Karla Alexander MRN: [id number] Date of Birth: Mar 09, 1933  Transition of Care [facility]) CM/SW Contact:  Crayton Docker, RN Phone Number: 07/15/2023, 1:57 PM   Clinical Narrative:     Discharge orders noted, discharge summary noted. CM completed FL2, alert to Dr. Murrel Arnt for signature. CM call to [name], regarding discharge transportation. Per [name] can pick in [facility] in 30 minutes. CM [facility] regarding [facility] pick up time and place.     Patient Goals and CMS Choice    Return to prior facility   Discharge Placement              Return to prior facility   Discharge Plan and Services Additional resources added to the After Visit Summary for      Group Home              DME Agency: [employer] Date DME Agency Contacted: 07/14/23 Time DME Agency Contacted: [phone] Representative spoke with at DME Agency: Harriet Limber  Social Drivers of Health (SDOH) Interventions SDOH Screenings   Food Insecurity: No Food Insecurity (07/11/2023)  Housing: Low Risk  (07/11/2023)  Transportation Needs: No Transportation Needs (07/11/2023)  Utilities: Not At Risk (07/11/2023)  Depression (PHQ2-9): Low Risk  (02/23/2023)  Social Connections: Socially Isolated (07/11/2023)  Tobacco Use: Low Risk  (07/11/2023)     Readmission Risk Interventions     No data to display

## 2023-07-15 NOTE — [place] FL2 (Signed)
 [place]  MEDICAID [id number] LEVEL OF CARE FORM     IDENTIFICATION  Patient Name: Karla  DORTHEY Alexander Birthdate: 1932/05/12 Sex: female Admission Date (Current Location): 07/11/2023  [place] and [place] Number:  Chiropodist and Address:  [facility], [address], [place], [place] [address]      Provider Number: [id number]  Attending Physician Name and Address:  Lorita Rosa, MD  Relative Name and Phone Number:       Current Level of Care: Other (Comment) (Group Home) Recommended Level of Care:   Prior Approval Number:    Date Approved/Denied: 06/27/15 PASRR Number: [id number]  Discharge Plan:  (Group Home)    Current Diagnoses: Patient Active Problem List   Diagnosis Date Noted   Dark stools 07/11/2023   Restless leg syndrome 07/11/2023   Chest pain 07/11/2023   Right renal mass 02/23/2023   Living in assisted living 12/07/2017   Degenerative disc disease, lumbar 09/17/2017   Ischemia 09/17/2017   Osteoporosis, post-menopausal 09/17/2017   Right-sided carotid artery disease (HCC) 09/17/2017   Vitamin D  deficiency 09/17/2017   Primary osteoarthritis involving multiple joints 08/26/2017   Healthcare maintenance 03/05/2017   Fever 10/10/2015   Chronic diastolic congestive heart failure (HCC) 08/09/2015   Dementia (HCC) 08/07/2015   Altered mental status 08/06/2015   Acute encephalopathy 08/06/2015   Hypothyroidism 08/06/2015   HLD (hyperlipidemia) 08/06/2015   Depression 08/06/2015   Hypertension 08/06/2015   Proximal humerus fracture 08/23/2014   Anemia, unspecified 08/07/2013   Ataxia 08/07/2013   Asthma without status asthmaticus 06/21/2013   COPD (chronic obstructive pulmonary disease) (HCC) 06/21/2013   GERD (gastroesophageal reflux disease) 06/21/2013   Other forms of dyspnea 06/21/2013   Recurrent major depression in remission (HCC) 06/21/2013   Spinal stenosis 06/21/2013   S/P lumbar spinal fusion 05/31/2013   CAD  S/P percutaneous coronary angioplasty 04/24/2013    Orientation RESPIRATION BLADDER Height & Weight     Self, Time, Situation, Place  Normal Continent Weight: 69.2 kg Height:  5' (152.4 cm)  BEHAVIORAL SYMPTOMS/MOOD NEUROLOGICAL BOWEL NUTRITION STATUS      Continent Diet (Please see discharge summary)  AMBULATORY STATUS COMMUNICATION OF NEEDS Skin   Supervision Verbally Normal                       Personal Care Assistance Level of Assistance  Feeding, Dressing, Bathing Bathing Assistance: Independent Feeding assistance: Independent Dressing Assistance: Independent     Functional Limitations Info             SPECIAL CARE FACTORS FREQUENCY                       Contractures      Additional Factors Info  Code Status, Allergies Code Status Info: Full code Allergies Info: No Known Allergies           Current Medications (07/15/2023):  This is the current hospital active medication list Current Facility-Administered Medications  Medication Dose Route Frequency Provider Last Rate Last Admin   acetaminophen  (TYLENOL ) tablet 650 mg  650 mg Oral Q6H PRN Cox, Amy N, DO   650 mg at 07/15/23 0606   Or   acetaminophen  (TYLENOL ) suppository 650 mg  650 mg Rectal Q6H PRN Cox, Amy N, DO       amLODipine  (NORVASC ) tablet 5 mg  5 mg Oral Daily Cox, Amy N, DO   5 mg at 07/15/23 0900   bisacodyl  (DULCOLAX) EC tablet  5 mg  5 mg Oral Daily PRN Lorita Rosa, MD   5 mg at 07/14/23 1415   budesonide  (PULMICORT ) nebulizer solution 0.25 mg  0.25 mg Nebulization BID Gideon Kussmaul, NP   0.25 mg at 07/15/23 1610   docusate sodium  (COLACE) capsule 100 mg  100 mg Oral BID Lorita Rosa, MD   100 mg at 07/15/23 0900   donepezil  (ARICEPT ) tablet 10 mg  10 mg Oral QHS Cox, Amy N, DO   10 mg at 07/14/23 2200   gabapentin  (NEURONTIN ) capsule 200 mg  200 mg Oral TID Cox, Amy N, DO   200 mg at 07/15/23 0900   hydrALAZINE  (APRESOLINE ) injection 5 mg  5 mg Intravenous Q6H PRN  Cox, Amy N, DO       ipratropium-albuterol  (DUONEB) 0.5-2.5 (3) MG/3ML nebulizer solution 3 mL  3 mL Nebulization Q6H PRN Gideon Kussmaul, NP   3 mL at 07/13/23 2033   ipratropium-albuterol  (DUONEB) 0.5-2.5 (3) MG/3ML nebulizer solution 3 mL  3 mL Nebulization BID Lorita Rosa, MD       isosorbide  mononitrate (ISMO ) tablet 30 mg  30 mg Oral Daily Cox, Amy N, DO   30 mg at 07/15/23 0900   levothyroxine  (SYNTHROID ) tablet 112 mcg  112 mcg Oral Q0600 Cox, Amy N, DO   112 mcg at 07/15/23 0600   lidocaine  (LIDODERM ) 5 % 1 patch  1 patch Transdermal BID PRN Cox, Amy N, DO   1 patch at 07/14/23 2206   loratadine  (CLARITIN ) tablet 10 mg  10 mg Oral Daily Cox, Amy N, DO   10 mg at 07/15/23 0900   metoprolol  tartrate (LOPRESSOR ) tablet 75 mg  75 mg Oral BID Cox, Amy N, DO   75 mg at 07/15/23 0900   ondansetron  (ZOFRAN ) tablet 4 mg  4 mg Oral Q6H PRN Cox, Amy N, DO       Or   ondansetron  (ZOFRAN ) injection 4 mg  4 mg Intravenous Q6H PRN Cox, Amy N, DO       pantoprazole  (PROTONIX ) EC tablet 40 mg  40 mg Oral BID Britta Candy, MD   40 mg at 07/15/23 0900   polyethylene glycol (MIRALAX  / GLYCOLAX ) packet 17 g  17 g Oral Daily Lorita Rosa, MD   17 g at 07/15/23 0900   rOPINIRole  (REQUIP ) tablet 1 mg  1 mg Oral QHS Cox, Amy N, DO   1 mg at 07/14/23 2200   sertraline  (ZOLOFT ) tablet 75 mg  75 mg Oral Daily Cox, Amy N, DO   75 mg at 07/15/23 0900   sodium chloride  tablet 1 g  1 g Oral TID WC Lorita Rosa, MD   1 g at 07/15/23 1332   sucralfate  (CARAFATE ) tablet 1 g  1 g Oral TID WC & HS Lorita Rosa, MD   1 g at 07/15/23 1332     Discharge Medications: Please see discharge summary for a list of discharge medications.  Relevant Imaging Results:  Relevant Lab Results:   Additional Information SSN: [id number]; Allergies: No Known Allergies  Crayton Docker, RN

## 2023-07-15 NOTE — Discharge Summary (Signed)
 Physician Discharge Summary   Patient: Karla Alexander MRN: [id number] DOB: 09/16/1932  Admit date:     07/11/2023  Discharge date: 07/15/23  Discharge Physician: Lorita Rosa   PCP: Tobi Fortes, NP   Recommendations at discharge:   Take Protonix  twice daily for 4 weeks and then once daily Take Carafate  up to 4 times daily with meals as needed for abdominal pain/GI upset Continue salt tablets with meals until PCP follow up appointment Follow up with NP Matrone in 1-2 weeks for hospital f/u and recheck of labs  Discharge Diagnoses: Principal Problem:   Dark stools Active Problems:   S/P lumbar spinal fusion   Proximal humerus fracture   Hypothyroidism   HLD (hyperlipidemia)   Hypertension   CAD S/P percutaneous coronary angioplasty   COPD (chronic obstructive pulmonary disease) (HCC)   GERD (gastroesophageal reflux disease)   Recurrent major depression in remission (HCC)   Right renal mass   Restless leg syndrome   Chest pain    Hospital Course: 89yo with h/o CKD stage IIIa, dementia, hypertension, hypothyroid, neuropathy, restless leg syndrome, and GERD who presented on 4/20 with melena stool for 2 weeks.  CTA with 9 cm exophytic right renal cell carcinoma, mildly increased since December; no active GI bleeding identified, consider diverticular bleed.  She was started on Protonix .  Hgb stable.  Assessment and Plan:  Dark stools Hgb stable Diet advanced to regular 07/12/2023 without difficulty Continue Protonix , changed to PO Added Carafate    Hyponatremia Appears to be chronic since 02/2023 Dropped on 4/22 Giving PO salt tabs TID x 3 days Slight improvement with salt tabs, will continue Will need outpatient f/u within 1 week   Restless leg syndrome Continue ropinirole    Right renal mass CT suggestive of right renal cell carcinoma Patient follows with outpatient oncology Patient will need to follow-up with Aris Bel and urology outpatient as appropriate    Recurrent major depression in remission  Continue sertraline  75 mg    Hypertension Continue amlodipine , Imdur , metoprolol  Hydralazine  IV prn   Hypothyroidism Continue Synthroid    Back pain Tylenol  PRN  Gabapentin  200 mg PO tid  Lidocaine  5% patch PRN        Consultants: None   Procedures: None   Antibiotics: None  Pain control - [place]  Controlled Substance Reporting System database was reviewed. and patient was instructed, not to drive, operate heavy machinery, perform activities at heights, swimming or participation in water activities or provide baby-sitting services while on Pain, Sleep and Anxiety Medications; until their outpatient Physician has advised to do so again. Also recommended to not to take more than prescribed Pain, Sleep and Anxiety Medications.   Disposition: Group home Diet recommendation:  Regular diet DISCHARGE MEDICATION: Allergies as of 07/15/2023   No Known Allergies      Medication List     STOP taking these medications    HYDROcodone -acetaminophen  5-325 MG tablet Commonly known as: NORCO/VICODIN   Stiolto Respimat 2.5-2.5 MCG/ACT Aers Generic drug: Tiotropium Bromide-Olodaterol       TAKE these medications    acetaminophen  650 MG CR tablet Commonly known as: TYLENOL  Take 650 mg by mouth every 4 (four) hours as needed for pain.   amLODipine  5 MG tablet Commonly known as: NORVASC  Take 5 mg by mouth daily.   docusate sodium  100 MG capsule Commonly known as: COLACE Take 1 capsule (100 mg total) by mouth 2 (two) times daily.   donepezil  10 MG tablet Commonly known as: ARICEPT  Take 10 mg by mouth  at bedtime.   ferrous sulfate 325 (65 FE) MG EC tablet 1 tablet (325 mg total) 3 (three) times a week.   fluticasone  50 MCG/ACT nasal spray Commonly known as: FLONASE  Place 1 spray into both nostrils daily as needed for rhinitis or allergies.   furosemide  20 MG tablet Commonly known as: LASIX  Take 1 tablet (20 mg  total) by mouth daily as needed for fluid or edema. What changed:  when to take this reasons to take this   gabapentin  400 MG capsule Commonly known as: NEURONTIN  Take 400 mg by mouth 3 (three) times daily.   Gemtesa 75 MG Tabs Generic drug: Vibegron Take 75 mg by mouth daily.   isosorbide  mononitrate 10 MG tablet Commonly known as: ISMO  Take 3 tablets (30 mg total) by mouth daily. What changed:  how to take this Another medication with the same name was removed. Continue taking this medication, and follow the directions you see here.   levothyroxine  112 MCG tablet Commonly known as: SYNTHROID  Take 112 mcg by mouth daily.   Linzess 145 MCG Caps capsule Generic drug: linaclotide Take 145 mcg by mouth daily.   loratadine  10 MG tablet Commonly known as: CLARITIN  Take 10 mg by mouth daily.   metoprolol  tartrate 25 MG tablet Commonly known as: LOPRESSOR  Take 75 mg by mouth 2 (two) times daily.   omeprazole 20 MG capsule Commonly known as: PRILOSEC Take 20 mg by mouth daily.   pantoprazole  40 MG tablet Commonly known as: PROTONIX  Take 1 tablet (40 mg total) by mouth 2 (two) times daily before a meal.   polyethylene glycol 17 g packet Commonly known as: MIRALAX  / GLYCOLAX  Take 17 g by mouth daily. Start taking on: July 16, 2023   rOPINIRole  1 MG tablet Commonly known as: REQUIP  Take 1 mg by mouth at bedtime.   sertraline  25 MG tablet Commonly known as: ZOLOFT  Take 75 mg by mouth daily.   sodium chloride  1 g tablet Take 1 tablet (1 g total) by mouth 3 (three) times daily with meals.   spironolactone 25 MG tablet Commonly known as: ALDACTONE Take 25 mg by mouth daily.   sucralfate  1 g tablet Commonly known as: CARAFATE  Take 1 tablet (1 g total) by mouth 4 (four) times daily -  with meals and at bedtime.   traZODone 100 MG tablet Commonly known as: DESYREL Take 100 mg by mouth at bedtime.               Durable Medical Equipment  (From admission,  onward)           Start     Ordered   07/14/23 0925  For home use only DME [facility]  Once       Question:  Patient needs a walker to treat with the following condition  Answer:  Ambulatory dysfunction   07/14/23 0924            Discharge Exam:    Subjective: Feeling ok, no complaints.   Objective: Vitals:   07/15/23 0734 07/15/23 0801  BP:  (!) 157/69  Pulse:  (!) 54  Resp:  16  Temp:  98.2 F (36.8 C)  SpO2: 96% 93%    Intake/Output Summary (Last 24 hours) at 07/15/2023 1338 Last data filed at 07/15/2023 1059 Gross per 24 hour  Intake 220 ml  Output 1 ml  Net 219 ml   Filed Weights   07/11/23 0951  Weight: 69.2 kg    Exam:  General:  Appears calm and comfortable and is in NAD Eyes:  EOMI, normal lids, iris ENT:  grossly normal hearing, lips & tongue, mmm Cardiovascular:  RRR, no m/r/g. No LE edema.  Respiratory:   CTA bilaterally with no wheezes/rales/rhonchi.  Normal respiratory effort. Abdomen:  soft, +epigastric TTP, ND Skin:  no rash or induration seen on limited exam Musculoskeletal:  grossly normal tone BUE/BLE, good ROM, no bony abnormality Psychiatric:  grossly normal mood and affect, speech fluent and appropriate, AOx3 Neurologic:  CN 2-12 grossly intact, moves all extremities in coordinated fashion   Data Reviewed: I have reviewed the patient's lab results since admission.  Pertinent labs for today include:   Na++ 127, improving BUN 24/Creatinine 1.18/GFR 44, stable Stable CBC    Condition at discharge: stable  The results of significant diagnostics from this hospitalization (including imaging, microbiology, ancillary and laboratory) are listed below for reference.   Imaging Studies: DG Chest 1 View Result Date: 07/13/2023 CLINICAL DATA:  Shortness of breath. EXAM: CHEST  1 VIEW COMPARISON:  Chest x-ray 07/11/2023 FINDINGS: The heart is enlarged. There are increased central interstitial markings bilaterally with some minimal  patchy perihilar opacities as well. The costophrenic angles are clear. There is no pneumothorax. Left shoulder arthroplasty is present. Atherosclerotic calcifications of the aorta. IMPRESSION: Cardiomegaly with increased central interstitial markings and minimal patchy perihilar opacities, likely pulmonary edema. Electronically Signed   By: Tyron Gallon M.D.   On: 07/13/2023 21:31   DG Chest Port 1 View Result Date: 07/11/2023 CLINICAL DATA:  Abdominal pain. EXAM: PORTABLE CHEST 1 VIEW COMPARISON:  One-view chest x-ray 11/12/2022 FINDINGS: The heart size is normal. Atherosclerotic calcifications are present at the aortic arch. Lung volumes are low. No edema or effusion is present. Left shoulder arthroplasty noted. Mild degenerative changes are present at the right shoulder. IMPRESSION: 1. Low lung volumes. 2. No acute cardiopulmonary disease. Electronically Signed   By: Audree Leas M.D.   On: 07/11/2023 14:24   CT ANGIO GI BLEED Result Date: 07/11/2023 CLINICAL DATA:  89 year old female with abdominal pain and dark tarry stools for 2 weeks. Known large right renal mass. * Tracking Code: BO * EXAM: CTA ABDOMEN AND PELVIS WITHOUT AND WITH CONTRAST TECHNIQUE: Multidetector CT imaging of the abdomen and pelvis was performed using the standard protocol during bolus administration of intravenous contrast. Multiplanar reconstructed images and MIPs were obtained and reviewed to evaluate the vascular anatomy. RADIATION DOSE REDUCTION: This exam was performed according to the departmental dose-optimization program which includes automated exposure control, adjustment of the mA and/or kV according to patient size and/or use of iterative reconstruction technique. CONTRAST:  75mL OMNIPAQUE  IOHEXOL  350 MG/ML SOLN COMPARISON:  CT Abdomen and Pelvis 03/09/2023. FINDINGS: VASCULAR Aortoiliac calcified atherosclerosis. Normal caliber abdominal aorta. Major arterial structures in the abdomen and pelvis remain patent.  Substantial Tumor angiogenesis in the right retroperitoneum, right pararenal space in association with large right renal cell carcinoma, detailed below. Portal venous system appears to be patent. Review of the MIP images confirms the above findings. NON-VASCULAR Lower chest: Stable heart size, upper limits of normal. No pericardial effusion. Moderate gastric hiatal hernia is stable since December. No pleural effusion. Stable lung bases, including a small right middle lobe lung nodule on series 6, image 5. No definite pulmonary metastasis. Hepatobiliary: Negative liver and gallbladder. Pancreas: Negative. Spleen: Negative. Adrenals/Urinary Tract: Adrenal glands remain normal. Left kidney is nonobstructed, normal. Large solid and enhancing exophytic right renal mass now measures up to 9 cm long axis (series  20, image 1 aunt Image 118) versus 8.7 cm when measured with a similar technique in December. Surrounding Tumor angiogenesis redemonstrated. Underlying right kidney seems to remain nonobstructed. And on delayed phase images there is fairly symmetric renal contrast excretion from both kidneys. Angio new genesis surrounding the right ureter. Mildly distended but otherwise unremarkable urinary bladder. Stomach/Bowel: Stable chronic gastric hiatal hernia. No dilated large or small bowel loops. Similar retained food or fluid in the stomach to last year. Extensive distal large bowel diverticulosis descending and sigmoid segments. Redundant right colon. Normal appendix on series 4, image 44. No contrast extravasation into the bowel lumen is identified. However, there is conspicuous increased density of the distal rectum and anal verge over this series of exams (series 16, image 174), but favor this is mucosal enhancement. No fluid in the rectum. No pneumoperitoneum. No free fluid or mesenteric inflammation is identified. Lymphatic: No lymphadenopathy identified. Reproductive: Within normal limits. Other: No pelvis free  fluid. Musculoskeletal: Advanced lumbar spine degeneration superimposed on 2 level lower lumbar and lumbosacral junction fusion. No acute or suspicious osseous lesion is identified. IMPRESSION: 1. Large, up to 9 cm exophytic Right Renal Cell Carcinoma, mildly increased since December. Surrounding Tumor Angiogenesis. No metastatic disease is identified. 2. No active GI bleeding identified by multiphase CTA. Distal large bowel diverticulosis, consider of diverticular bleeding source if there is red blood per rectum. 3.  No bowel obstruction or active inflammation.  Normal appendix. 4.  Aortic Atherosclerosis (ICD10-I70.0). Electronically Signed   By: Marlise Simpers M.D.   On: 07/11/2023 11:35    Microbiology: Results for orders placed or performed during the hospital encounter of 10/10/15  Blood Culture (routine x 2)     Status: None   Collection Time: 10/10/15 11:27 AM   Specimen: BLOOD  Result Value Ref Range Status   Specimen Description BLOOD LEFT WRIST  Final   Special Requests BOTTLES DRAWN AEROBIC AND ANAEROBIC 3CCAERO,3CCANA  Final   Culture NO GROWTH 5 DAYS  Final   Report Status 10/15/2015 FINAL  Final  Blood Culture (routine x 2)     Status: None   Collection Time: 10/10/15 11:28 AM   Specimen: BLOOD  Result Value Ref Range Status   Specimen Description BLOOD RIGHT ASSIST CONTROL  Final   Special Requests BOTTLES DRAWN AEROBIC AND ANAEROBIC 8CCAERO,6CCANA  Final   Culture NO GROWTH 5 DAYS  Final   Report Status 10/15/2015 FINAL  Final  Urine culture     Status: Abnormal   Collection Time: 10/10/15 11:28 AM   Specimen: Urine, Random  Result Value Ref Range Status   Specimen Description URINE, RANDOM  Final   Special Requests NONE  Final   Culture 40,000 COLONIES/mL ESCHERICHIA COLI (A)  Final   Report Status 10/12/2015 FINAL  Final   Organism ID, Bacteria ESCHERICHIA COLI (A)  Final      Susceptibility   Escherichia coli - MIC*    AMPICILLIN >=32 RESISTANT Resistant     CEFAZOLIN   <=4 SENSITIVE Sensitive     CEFTRIAXONE <=1 SENSITIVE Sensitive     CIPROFLOXACIN >=4 RESISTANT Resistant     GENTAMICIN  <=1 SENSITIVE Sensitive     IMIPENEM <=0.25 SENSITIVE Sensitive     NITROFURANTOIN  <=16 SENSITIVE Sensitive     TRIMETH /SULFA  <=20 SENSITIVE Sensitive     AMPICILLIN/SULBACTAM 16 INTERMEDIATE Intermediate     PIP/TAZO <=4 SENSITIVE Sensitive     Extended ESBL NEGATIVE Sensitive     * 40,000 COLONIES/mL ESCHERICHIA COLI  Urine  culture     Status: Abnormal   Collection Time: 10/10/15 11:29 AM   Specimen: Urine, Random  Result Value Ref Range Status   Specimen Description URINE, RANDOM  Final   Special Requests NONE  Final   Culture (A)  Final    4,000 COLONIES/mL INSIGNIFICANT GROWTH Performed at [facility]    Report Status 10/11/2015 FINAL  Final  MRSA PCR Screening     Status: None   Collection Time: 10/10/15 11:46 PM   Specimen: Nasal Mucosa; Nasopharyngeal  Result Value Ref Range Status   MRSA by PCR NEGATIVE NEGATIVE Final    Comment:        The GeneXpert MRSA Assay (FDA approved for NASAL specimens only), is one component of a comprehensive MRSA colonization surveillance program. It is not intended to diagnose MRSA infection nor to guide or monitor treatment for MRSA infections.     Labs: CBC: Recent Labs  Lab 07/11/23 1002 07/12/23 0329 07/13/23 0339 07/14/23 0451 07/15/23 0247  WBC 4.8 5.3 6.4 6.0 8.7  NEUTROABS  --   --   --  5.5 7.0  HGB 15.3* 14.5 14.6 15.4* 15.4*  HCT 46.0 42.6 42.9 44.0 45.5  MCV 97.9 95.1 95.8 92.6 95.0  PLT 216 207 199 193 221   Basic Metabolic Panel: Recent Labs  Lab 07/11/23 1002 07/12/23 0329 07/13/23 0339 07/14/23 0451 07/15/23 0247  NA 132* 134* 125* 126* 127*  K 4.4 4.4 4.1 3.6 3.7  CL 99 99 93* 93* 90*  CO2 26 28 23 23 26   GLUCOSE 108* 88 87 162* 92  BUN 19 15 14 17  24*  CREATININE 1.07* 1.11* 1.06* 1.15* 1.18*  CALCIUM  9.2 9.5 9.2 9.4 9.5  MG  --   --  1.7  --   --    Liver  Function Tests: Recent Labs  Lab 07/11/23 1002 07/13/23 0339  AST 28 20  ALT 23 19  ALKPHOS 62 59  BILITOT 1.0 0.9  PROT 6.9 6.5  ALBUMIN  4.1 4.0   CBG: No results for input(s): "GLUCAP" in the last 168 hours.  Discharge time spent: greater than 30 minutes.  Signed: Lorita Rosa, MD [facility] 07/15/2023

## 2023-07-15 NOTE — Progress Notes (Signed)
 Mobility Specialist - Progress Note     07/15/23 1131  Mobility  Activity Ambulated with assistance in hallway;Stood at bedside  Level of Assistance Standby assist, set-up cues, supervision of patient - no hands on  Assistive Device Front wheel walker  Distance Ambulated (ft) 160 ft  Range of Motion/Exercises Active  RUE Weight Bearing Per Provider Order WBAT  Activity Response Tolerated well  Mobility Referral Yes  Mobility visit 1 Mobility   Pt resting in chair upon entry on RA. Pt STS and ambulates to hallway around NS SBA with RW. Pt given manual cuing to keep walker straight. Pt returned to chair and left with needs in reach.   Jerri Morale Mobility Specialist 07/15/23, 11:45 AM

## 2023-07-16 LAB — OSMOLALITY: Osmolality: 273 mosm/kg — ABNORMAL LOW (ref 275–295)

## 2023-07-18 ENCOUNTER — Emergency Department

## 2023-07-18 ENCOUNTER — Emergency Department
Admission: EM | Admit: 2023-07-18 | Discharge: 2023-07-19 | Disposition: A | Discharge status: Home / Self Care | Attending: Emergency Medicine | Admitting: Emergency Medicine

## 2023-07-18 ENCOUNTER — Other Ambulatory Visit: Payer: Self-pay

## 2023-07-18 DIAGNOSIS — R21 Rash and other nonspecific skin eruption: Secondary | ICD-10-CM | POA: Diagnosis present

## 2023-07-18 DIAGNOSIS — N189 Chronic kidney disease, unspecified: Secondary | ICD-10-CM | POA: Diagnosis not present

## 2023-07-18 DIAGNOSIS — F0390 Unspecified dementia without behavioral disturbance: Secondary | ICD-10-CM | POA: Insufficient documentation

## 2023-07-18 DIAGNOSIS — I129 Hypertensive chronic kidney disease with stage 1 through stage 4 chronic kidney disease, or unspecified chronic kidney disease: Secondary | ICD-10-CM | POA: Diagnosis not present

## 2023-07-18 DIAGNOSIS — E039 Hypothyroidism, unspecified: Secondary | ICD-10-CM | POA: Insufficient documentation

## 2023-07-18 DIAGNOSIS — B029 Zoster without complications: Secondary | ICD-10-CM | POA: Insufficient documentation

## 2023-07-18 DIAGNOSIS — E876 Hypokalemia: Secondary | ICD-10-CM | POA: Diagnosis not present

## 2023-07-18 LAB — COMPREHENSIVE METABOLIC PANEL WITH GFR
ALT: 13 U/L (ref 0–44)
AST: 12 U/L — ABNORMAL LOW (ref 15–41)
Albumin: 2.7 g/dL — ABNORMAL LOW (ref 3.5–5.0)
Alkaline Phosphatase: 42 U/L (ref 38–126)
Anion gap: 7 (ref 5–15)
BUN: 11 mg/dL (ref 8–23)
CO2: 21 mmol/L — ABNORMAL LOW (ref 22–32)
Calcium: 6.7 mg/dL — ABNORMAL LOW (ref 8.9–10.3)
Chloride: 108 mmol/L (ref 98–111)
Creatinine, Ser: 0.85 mg/dL (ref 0.44–1.00)
GFR, Estimated: >60 mL/min (ref >60)
Glucose, Bld: 86 mg/dL (ref 70–99)
Potassium: 2.9 mmol/L — ABNORMAL LOW (ref 3.5–5.1)
Sodium: 136 mmol/L (ref 135–145)
Total Bilirubin: 0.3 mg/dL (ref 0.0–1.2)
Total Protein: 4.8 g/dL — ABNORMAL LOW (ref 6.5–8.1)

## 2023-07-18 LAB — CBC WITH DIFFERENTIAL/PLATELET
Abs Immature Granulocytes: 0.03 10*3/uL (ref 0.00–0.07)
Basophils Absolute: 0.1 10*3/uL (ref 0.0–0.1)
Basophils Relative: 1 %
Eosinophils Absolute: 0.1 10*3/uL (ref 0.0–0.5)
Eosinophils Relative: 2 %
HCT: 44 % (ref 36.0–46.0)
Hemoglobin: 14.6 g/dL (ref 12.0–15.0)
Immature Granulocytes: 1 %
Lymphocytes Relative: 26 %
Lymphs Abs: 1.4 10*3/uL (ref 0.7–4.0)
MCH: 32.4 pg (ref 26.0–34.0)
MCHC: 33.2 g/dL (ref 30.0–36.0)
MCV: 97.6 fL (ref 80.0–100.0)
Monocytes Absolute: 0.7 10*3/uL (ref 0.1–1.0)
Monocytes Relative: 13 %
Neutro Abs: 3.1 10*3/uL (ref 1.7–7.7)
Neutrophils Relative %: 57 %
Platelets: 208 10*3/uL (ref 150–400)
RBC: 4.51 MIL/uL (ref 3.87–5.11)
RDW: 12.5 % (ref 11.5–15.5)
WBC: 5.4 10*3/uL (ref 4.0–10.5)
nRBC: 0 % (ref 0.0–0.2)

## 2023-07-18 LAB — TROPONIN I (HIGH SENSITIVITY): Troponin I (High Sensitivity): 6 ng/L (ref <18)

## 2023-07-18 LAB — LIPASE, BLOOD: Lipase: 32 U/L (ref 11–51)

## 2023-07-18 MED ORDER — ACYCLOVIR 200 MG PO CAPS
800.0000 mg | ORAL_CAPSULE | Freq: Once | ORAL | Status: AC
  Administered 2023-07-18: 800 mg via ORAL
  Filled 2023-07-18: qty 4

## 2023-07-18 MED ORDER — OXYCODONE HCL 5 MG PO TABS
2.5000 mg | ORAL_TABLET | Freq: Once | ORAL | Status: AC
  Administered 2023-07-18: 2.5 mg via ORAL
  Filled 2023-07-18: qty 1

## 2023-07-18 MED ORDER — POTASSIUM CHLORIDE 10 MEQ/100ML IV SOLN
10.0000 meq | Freq: Once | INTRAVENOUS | Status: AC
  Administered 2023-07-18: 10 meq via INTRAVENOUS
  Filled 2023-07-18: qty 100

## 2023-07-18 MED ORDER — POTASSIUM CHLORIDE CRYS ER 20 MEQ PO TBCR
40.0000 meq | EXTENDED_RELEASE_TABLET | Freq: Once | ORAL | Status: AC
  Administered 2023-07-18: 40 meq via ORAL
  Filled 2023-07-18: qty 2

## 2023-07-18 MED ORDER — ACYCLOVIR 400 MG PO TABS: 800.0000 mg | ORAL_TABLET | Freq: Every day | ORAL | 0 refills | Status: AC

## 2023-07-18 NOTE — ED Provider Notes (Addendum)
 [facility] Provider Note    Event Date/Time   First MD Initiated Contact with Patient 07/18/23 2009     (approximate)   History   Abdominal Pain and Rash   HPI  Karla  B Alexander is a 89 y.o. female with history of CKD, dementia, hypertension, hypothyroid, neuropathy, restless leg syndrome who comes in with abdominal pain, rash.  I reviewed his new where patient was admitted to the hospital from 4/20 until 4/24.  Patient had dark stools, started on Protonix .  Patient was hyponatremic and started on salt tabs.  She has a right renal mass consistent with right renal cell carcinoma and recommended follow-up outpatient with oncology.  To me patient reports pain on the right side of her chest wall.  She reports a rash that started 3 days ago.  Her abdomen is soft and nontender she is really not having any abdominal pain it is more of a pain where the's rash is located at.  Patient does have a legal guardian which I attended to call x 2 without any response  Physical Exam   Triage Vital Signs: ED Triage Vitals  Encounter Vitals Group     BP 07/18/23 2003 (!) 133/116     Systolic BP Percentile --      Diastolic BP Percentile --      Pulse Rate 07/18/23 2003 65     Resp 07/18/23 2003 14     Temp 07/18/23 2006 98.7 F (37.1 C)     Temp Source 07/18/23 2006 Oral     SpO2 07/18/23 2000 95 %     Weight 07/18/23 2002 150 lb (68 kg)     Height 07/18/23 2002 5' (1.524 m)     Head Circumference --      Peak Flow --      Pain Score 07/18/23 2002 7     Pain Loc --      Pain Education --      Exclude from Growth Chart --     Most recent vital signs: Vitals:   07/18/23 2003 07/18/23 2006  BP: (!) 133/116   Pulse: 65   Resp: 14   Temp:  98.7 F (37.1 C)  SpO2: 95%      General: Awake, no distress.  CV:  Good peripheral perfusion.  Resp:  Normal effort.  Abd:  No distention.  Other:  Patient has shingles noted on the right side of the chest wall. Does  not cross midline. Single dermatome.    ED Results / Procedures / Treatments   Labs (all labs ordered are listed, but only abnormal results are displayed) Labs Reviewed  COMPREHENSIVE METABOLIC PANEL WITH GFR - Abnormal; Notable for the following components:      Result Value   Potassium 2.9 (*)    CO2 21 (*)    Calcium  6.7 (*)    Total Protein 4.8 (*)    Albumin  2.7 (*)    AST 12 (*)    All other components within normal limits  CBC WITH DIFFERENTIAL/PLATELET  LIPASE, BLOOD  TROPONIN I (HIGH SENSITIVITY)  TROPONIN I (HIGH SENSITIVITY)     EKG  My interpretation of EKG:  Sinus rate of 61 without any ST elevation or T wave inversions, normal intervals  Sinus rate of 63 with some possible ST elevation but looks more like artifact, normal intervals  RADIOLOGY I have reviewed the xray personally and interpreted no evidence of any pneumonia   PROCEDURES:  Critical Care  performed: No  .1-3 Lead EKG Interpretation  Performed by: Lubertha Rush, MD Authorized by: Lubertha Rush, MD     Interpretation: normal     ECG rate:  60   ECG rate assessment: normal     Rhythm: sinus rhythm     Ectopy: none     Conduction: normal      MEDICATIONS ORDERED IN ED: Medications  potassium chloride  10 mEq in 100 mL IVPB (has no administration in time range)  oxyCODONE  (Oxy IR/ROXICODONE ) immediate release tablet 2.5 mg (2.5 mg Oral Given 07/18/23 2037)  acyclovir (ZOVIRAX) 200 MG capsule 800 mg (800 mg Oral Given 07/18/23 2044)  potassium chloride  SA (KLOR-CON  M) CR tablet 40 mEq (40 mEq Oral Given 07/18/23 2250)     IMPRESSION / MDM / ASSESSMENT AND PLAN / ED COURSE  I reviewed the triage vital signs and the nursing notes.   Patient's presentation is most consistent with acute presentation with potential threat to life or bodily function.   Patient comes in with what looks like shingles patient is a poor historian due to her dementia have attempted to call legal guardian  multiple times to try to get goals of care.  I will get some screening labs just given the report of some abdominal pain and she is not the best historian at this could be any chest pain to make sure is nothing else going on but her abdominal exam overall appears reassuring and her exam seems consistent with shingles.  No signs of patient being immunosuppressed.  Attempted to call legal gaurdian no answer  Lipase is normal.  CMP shows slightly low potassium at 2.9 patient will give a dose of IV and p.o. potassium.  CBC reassuring  trop negative  Given this workup patient will be discharged.  I have again attempted to call family again without any information.  10:59 PM repeat abdominal exam soft nontender and she really only has pain with the rash is therefore I do not feel a CT imaging is warranted.  Will start patient on acyclovir recommend Tylenol , ibuprofen for pain and can follow-up with her primary care doctor  The patient is on the cardiac monitor to evaluate for evidence of arrhythmia and/or significant heart rate changes.      FINAL CLINICAL IMPRESSION(S) / ED DIAGNOSES   Final diagnoses:  Herpes zoster without complication     Rx / DC Orders   ED Discharge Orders     None        Note:  This document was prepared using Dragon voice recognition software and may include unintentional dictation errors.   Lubertha Rush, MD 07/18/23 2259    Lubertha Rush, MD 07/18/23 2259    Lubertha Rush, MD 07/18/23 2300

## 2023-07-18 NOTE — ED Triage Notes (Signed)
 Per EMS  Stomach pain Radiates to back Rash On back Concern for shingles

## 2023-07-18 NOTE — Discharge Instructions (Addendum)
 Take Tylenol  1 g every 8 hours with ibuprofen 600 every 8 hours with food to help with pain.  We have started her on some antiviral medications to help with the rash and given her first dose here.  She should return to the ER if develops worsening symptoms or any other concerns  Follow-up with her primary care doctor for recheck of her potassium.  It was slightly low here and we have given her some repletion.

## 2023-07-19 DIAGNOSIS — B029 Zoster without complications: Secondary | ICD-10-CM | POA: Diagnosis not present

## 2023-07-19 LAB — TROPONIN I (HIGH SENSITIVITY): Troponin I (High Sensitivity): 7 ng/L (ref <18)

## 2023-07-19 NOTE — ED Notes (Addendum)
 This RN Sammie Crigler from [facility] where the patient resides. He states he is on his way and will pick the patient up ASAP.

## 2023-12-24 ENCOUNTER — Other Ambulatory Visit: Payer: Self-pay

## 2023-12-24 ENCOUNTER — Emergency Department

## 2023-12-24 ENCOUNTER — Inpatient Hospital Stay
Admission: EM | Admit: 2023-12-24 | Discharge: 2023-12-26 | DRG: 871 | Disposition: A | Discharge status: Home Health | Attending: Internal Medicine | Admitting: Internal Medicine

## 2023-12-24 DIAGNOSIS — R197 Diarrhea, unspecified: Secondary | ICD-10-CM | POA: Diagnosis present

## 2023-12-24 DIAGNOSIS — Z79899 Other long term (current) drug therapy: Secondary | ICD-10-CM

## 2023-12-24 DIAGNOSIS — I10 Essential (primary) hypertension: Secondary | ICD-10-CM | POA: Diagnosis present

## 2023-12-24 DIAGNOSIS — Z8249 Family history of ischemic heart disease and other diseases of the circulatory system: Secondary | ICD-10-CM | POA: Diagnosis not present

## 2023-12-24 DIAGNOSIS — E860 Dehydration: Secondary | ICD-10-CM | POA: Diagnosis present

## 2023-12-24 DIAGNOSIS — I130 Hypertensive heart and chronic kidney disease with heart failure and stage 1 through stage 4 chronic kidney disease, or unspecified chronic kidney disease: Secondary | ICD-10-CM | POA: Diagnosis present

## 2023-12-24 DIAGNOSIS — I5032 Chronic diastolic (congestive) heart failure: Secondary | ICD-10-CM | POA: Diagnosis present

## 2023-12-24 DIAGNOSIS — R0902 Hypoxemia: Secondary | ICD-10-CM

## 2023-12-24 DIAGNOSIS — Z1152 Encounter for screening for COVID-19: Secondary | ICD-10-CM

## 2023-12-24 DIAGNOSIS — J189 Pneumonia, unspecified organism: Secondary | ICD-10-CM

## 2023-12-24 DIAGNOSIS — J9601 Acute respiratory failure with hypoxia: Secondary | ICD-10-CM | POA: Diagnosis present

## 2023-12-24 DIAGNOSIS — F0390 Unspecified dementia without behavioral disturbance: Secondary | ICD-10-CM | POA: Diagnosis present

## 2023-12-24 DIAGNOSIS — I2510 Atherosclerotic heart disease of native coronary artery without angina pectoris: Secondary | ICD-10-CM | POA: Diagnosis present

## 2023-12-24 DIAGNOSIS — K219 Gastro-esophageal reflux disease without esophagitis: Secondary | ICD-10-CM | POA: Diagnosis present

## 2023-12-24 DIAGNOSIS — A419 Sepsis, unspecified organism: Principal | ICD-10-CM | POA: Diagnosis present

## 2023-12-24 DIAGNOSIS — J40 Bronchitis, not specified as acute or chronic: Secondary | ICD-10-CM

## 2023-12-24 DIAGNOSIS — E785 Hyperlipidemia, unspecified: Secondary | ICD-10-CM | POA: Diagnosis present

## 2023-12-24 DIAGNOSIS — J206 Acute bronchitis due to rhinovirus: Secondary | ICD-10-CM | POA: Diagnosis present

## 2023-12-24 DIAGNOSIS — N183 Chronic kidney disease, stage 3 unspecified: Secondary | ICD-10-CM | POA: Diagnosis present

## 2023-12-24 DIAGNOSIS — Z981 Arthrodesis status: Secondary | ICD-10-CM | POA: Diagnosis not present

## 2023-12-24 DIAGNOSIS — G9341 Metabolic encephalopathy: Secondary | ICD-10-CM | POA: Diagnosis present

## 2023-12-24 DIAGNOSIS — Z9189 Other specified personal risk factors, not elsewhere classified: Secondary | ICD-10-CM

## 2023-12-24 DIAGNOSIS — Z96612 Presence of left artificial shoulder joint: Secondary | ICD-10-CM | POA: Diagnosis present

## 2023-12-24 DIAGNOSIS — E039 Hypothyroidism, unspecified: Secondary | ICD-10-CM | POA: Diagnosis present

## 2023-12-24 DIAGNOSIS — Z8659 Personal history of other mental and behavioral disorders: Secondary | ICD-10-CM

## 2023-12-24 DIAGNOSIS — Z79890 Hormone replacement therapy (postmenopausal): Secondary | ICD-10-CM | POA: Diagnosis not present

## 2023-12-24 DIAGNOSIS — J302 Other seasonal allergic rhinitis: Secondary | ICD-10-CM | POA: Diagnosis present

## 2023-12-24 DIAGNOSIS — F32A Depression, unspecified: Secondary | ICD-10-CM | POA: Diagnosis present

## 2023-12-24 DIAGNOSIS — R001 Bradycardia, unspecified: Secondary | ICD-10-CM | POA: Diagnosis not present

## 2023-12-24 DIAGNOSIS — N179 Acute kidney failure, unspecified: Secondary | ICD-10-CM | POA: Diagnosis present

## 2023-12-24 DIAGNOSIS — R531 Weakness: Secondary | ICD-10-CM | POA: Diagnosis not present

## 2023-12-24 LAB — URINALYSIS, ROUTINE W REFLEX MICROSCOPIC
Bilirubin Urine: NEGATIVE
Glucose, UA: NEGATIVE mg/dL
Hgb urine dipstick: NEGATIVE
Ketones, ur: NEGATIVE mg/dL
Nitrite: NEGATIVE
Protein, ur: NEGATIVE mg/dL
Specific Gravity, Urine: 1.014 (ref 1.005–1.030)
pH: 8 (ref 5.0–8.0)

## 2023-12-24 LAB — CBC
HCT: 51.3 % — ABNORMAL HIGH (ref 36.0–46.0)
Hemoglobin: 16.8 g/dL — ABNORMAL HIGH (ref 12.0–15.0)
MCH: 31.8 pg (ref 26.0–34.0)
MCHC: 32.7 g/dL (ref 30.0–36.0)
MCV: 97 fL (ref 80.0–100.0)
Platelets: 163 K/uL (ref 150–400)
RBC: 5.29 MIL/uL — ABNORMAL HIGH (ref 3.87–5.11)
RDW: 12.7 % (ref 11.5–15.5)
WBC: 9.7 K/uL (ref 4.0–10.5)
nRBC: 0 % (ref 0.0–0.2)

## 2023-12-24 LAB — LACTIC ACID, PLASMA: Lactic Acid, Venous: 1 mmol/L (ref 0.5–1.9)

## 2023-12-24 LAB — COMPREHENSIVE METABOLIC PANEL WITH GFR
ALT: 16 U/L (ref 0–44)
AST: 23 U/L (ref 15–41)
Albumin: 4.4 g/dL (ref 3.5–5.0)
Alkaline Phosphatase: 76 U/L (ref 38–126)
Anion gap: 9 (ref 5–15)
BUN: 16 mg/dL (ref 8–23)
CO2: 28 mmol/L (ref 22–32)
Calcium: 9.4 mg/dL (ref 8.9–10.3)
Chloride: 98 mmol/L (ref 98–111)
Creatinine, Ser: 1.41 mg/dL — ABNORMAL HIGH (ref 0.44–1.00)
GFR, Estimated: 36 mL/min — ABNORMAL LOW (ref >60)
Glucose, Bld: 109 mg/dL — ABNORMAL HIGH (ref 70–99)
Potassium: 4.5 mmol/L (ref 3.5–5.1)
Sodium: 135 mmol/L (ref 135–145)
Total Bilirubin: 0.9 mg/dL (ref 0.0–1.2)
Total Protein: 7.4 g/dL (ref 6.5–8.1)

## 2023-12-24 LAB — RESP PANEL BY RT-PCR (RSV, FLU A&B, COVID)  RVPGX2
Influenza A by PCR: NEGATIVE
Influenza B by PCR: NEGATIVE
Resp Syncytial Virus by PCR: NEGATIVE
SARS Coronavirus 2 by RT PCR: NEGATIVE

## 2023-12-24 MED ORDER — SODIUM CHLORIDE 0.9 % IV SOLN
2.0000 g | Freq: Once | INTRAVENOUS | Status: AC
  Administered 2023-12-24: 2 g via INTRAVENOUS
  Filled 2023-12-24: qty 20

## 2023-12-24 MED ORDER — SODIUM CHLORIDE 0.9 % IV SOLN: 2.0000 g | INTRAVENOUS | Status: DC

## 2023-12-24 MED ORDER — DEXTROSE 5 % IV SOLN
250.0000 mg | INTRAVENOUS | Status: DC
  Administered 2023-12-25: 250 mg via INTRAVENOUS
  Filled 2023-12-24 (×2): qty 2.5

## 2023-12-24 MED ORDER — ENOXAPARIN SODIUM 30 MG/0.3ML IJ SOSY
30.0000 mg | PREFILLED_SYRINGE | INTRAMUSCULAR | Status: DC
  Administered 2023-12-25 – 2023-12-26 (×2): 30 mg via SUBCUTANEOUS
  Filled 2023-12-24 (×2): qty 0.3

## 2023-12-24 MED ORDER — IOHEXOL 350 MG/ML SOLN
75.0000 mL | Freq: Once | INTRAVENOUS | Status: AC | PRN
  Administered 2023-12-24: 75 mL via INTRAVENOUS

## 2023-12-24 MED ORDER — ACETAMINOPHEN 325 MG PO TABS
650.0000 mg | ORAL_TABLET | Freq: Once | ORAL | Status: AC
  Administered 2023-12-24: 650 mg via ORAL
  Filled 2023-12-24: qty 2

## 2023-12-24 MED ORDER — SODIUM CHLORIDE 0.9 % IV SOLN
500.0000 mg | Freq: Once | INTRAVENOUS | Status: AC
  Administered 2023-12-24: 500 mg via INTRAVENOUS
  Filled 2023-12-24 (×2): qty 5

## 2023-12-24 NOTE — ED Triage Notes (Signed)
 Patient arrives via EMS from a group home C/O increased AMS (dementia at baseline), cough that began a few days ago, fever, and weakness that began today. Patient is febrile at 103 oral.

## 2023-12-24 NOTE — H&P (Addendum)
 History and Physical  Karla Alexander FMW:[id number] DOB: 1932/08/21 DOA: 12/24/2023  Referring physician: Dr. Claudene Rover, EDP  PCP: Caretha Barter, NP  Outpatient Specialists: Cardiology. Patient coming from: Group home  Chief Complaint: Altered mental status.  HPI: Karla Alexander is a 89 y.o. female with medical history significant for dementia, hypertension, CAD, chronic HFpEF, who presented to the ER from group home with altered mental status.  Associated with a cough that began a few days ago.  Reportedly, the patient was febrile at the facility and was having generalized weakness today.  The patient was noted to be hypoxic with O2 saturation in the 80s on room air upon EMS arrival.  In the ER, febrile with Tmax 103.2.  Somnolent but arousable to voices.  UA was negative for pyuria.  Chest x-ray was done and CT angio chest was negative for PE.  Due to concern for possible community acquired pneumonia, the patient was started on Rocephin and azithromycin in the ER.  Admitted for further management.  ED Course: Tmax 103.2.  BP 152/55, pulse 71, respiratory rate 16, O2 saturation 98% on 3 L.  Not on oxygen supplementation at baseline.  Lab studies notable for creatinine 1.41 with GFR 36.  Review of Systems: Review of systems as noted in the HPI. All other systems reviewed and are negative.   Past Medical History:  Diagnosis Date   Arthritis    Constipation    Coronary artery disease    non-obstructive CAD 04/2013   Dementia (HCC)    Depression    GERD (gastroesophageal reflux disease)    Hyperlipemia    Hypertension    dr tanna   [place]   Hypothyroidism    Seasonal allergies    Shortness of breath    Past Surgical History:  Procedure Laterality Date   BACK SURGERY     CARDIAC CATHETERIZATION  04/24/2013   20% mLAD, 20% OM1, 30% #1/60% #2 pRCA [facility]).  EF 68% by stress test 04/12/13.    MAXIMUM ACCESS (MAS)POSTERIOR LUMBAR INTERBODY FUSION (PLIF) 2 LEVEL N/A  05/31/2013   Procedure: FOR MAXIMUM ACCESS (MAS) POSTERIOR LUMBAR INTERBODY FUSION (PLIF) Lumbar Four-Five, Lumbar Five-Sacral One;  Surgeon: Alm GORMAN Molt, MD;  Location: [facility] OR[facility];  Service: Neurosurgery;  Laterality: N/A;  FOR MAXIMUM ACCESS (MAS) POSTERIOR LUMBAR INTERBODY FUSION (PLIF) Lumbar Four-Five, Lumbar Five-Sacral One   REVERSE SHOULDER ARTHROPLASTY Left 08/23/2014   Procedure: REVERSE SHOULDER ARTHROPLASTY WITH APPLICATION OF CABLES ;  Surgeon: Eva Herring, MD;  Location: [facility] OR;  Service: Orthopedics;  Laterality: Left;  Left reverse total shoulder arthroplasty    Social History:  reports that she has never smoked. She has never been exposed to tobacco smoke. She has never used smokeless tobacco. She reports that she does not drink alcohol and does not use drugs.   No Known Allergies  Family History  Problem Relation Age of Onset   Hypertension Other       Prior to Admission medications   Medication Sig Start Date End Date Taking? Authorizing Provider  acetaminophen  (TYLENOL ) 650 MG CR tablet Take 650 mg by mouth every 4 (four) hours as needed for pain.    [provider]  amLODipine  (NORVASC ) 5 MG tablet Take 5 mg by mouth daily.    [provider]  docusate sodium  (COLACE) 100 MG capsule Take 1 capsule (100 mg total) by mouth 2 (two) times daily. 07/15/23   Barbarann Nest, MD  donepezil  (ARICEPT ) 10 MG tablet Take 10  mg by mouth at bedtime.    [provider]  ferrous sulfate 325 (65 FE) MG EC tablet 1 tablet (325 mg total) 3 (three) times a week. 04/18/21   [provider]  fluticasone  (FLONASE ) 50 MCG/ACT nasal spray Place 1 spray into both nostrils daily as needed for rhinitis or allergies. 05/11/23   [provider]  furosemide  (LASIX ) 20 MG tablet Take 1 tablet (20 mg total) by mouth daily as needed for fluid or edema. 07/15/23   Barbarann Nest, MD  gabapentin  (NEURONTIN ) 400 MG capsule Take 400 mg by mouth 3 (three) times  daily.    [provider]  GEMTESA 75 MG TABS Take 75 mg by mouth daily. 02/23/23   [provider]  isosorbide  mononitrate (ISMO ) 10 MG tablet Take 3 tablets (30 mg total) by mouth daily. 07/15/23   Barbarann Nest, MD  levothyroxine  (SYNTHROID ) 112 MCG tablet Take 112 mcg by mouth daily. 07/12/23   [provider]  LINZESS 145 MCG CAPS capsule Take 145 mcg by mouth daily. 07/07/23   [provider]  loratadine  (CLARITIN ) 10 MG tablet Take 10 mg by mouth daily.    [provider]  metoprolol  tartrate (LOPRESSOR ) 25 MG tablet Take 75 mg by mouth 2 (two) times daily.    [provider]  omeprazole (PRILOSEC) 20 MG capsule Take 20 mg by mouth daily. 02/23/23   [provider]  pantoprazole  (PROTONIX ) 40 MG tablet Take 1 tablet (40 mg total) by mouth 2 (two) times daily before a meal. 07/15/23   Barbarann Nest, MD  polyethylene glycol (MIRALAX  / GLYCOLAX ) 17 g packet Take 17 g by mouth daily. 07/16/23   Barbarann Nest, MD  rOPINIRole  (REQUIP ) 1 MG tablet Take 1 mg by mouth at bedtime.    [provider]  sertraline  (ZOLOFT ) 25 MG tablet Take 75 mg by mouth daily.    [provider]  sodium chloride  1 g tablet Take 1 tablet (1 g total) by mouth 3 (three) times daily with meals. 07/15/23   Barbarann Nest, MD  spironolactone (ALDACTONE) 25 MG tablet Take 25 mg by mouth daily. 07/12/23   [provider]  sucralfate  (CARAFATE ) 1 g tablet Take 1 tablet (1 g total) by mouth 4 (four) times daily -  with meals and at bedtime. 07/15/23   Barbarann Nest, MD  traZODone (DESYREL) 100 MG tablet Take 100 mg by mouth at bedtime. 07/12/23   [provider]    Physical Exam: BP 138/60   Pulse 68   Temp (!) 102.1 F (38.9 C) (Rectal)   Resp 17   Ht 5' (1.524 m)   Wt 74.8 kg   SpO2 95%   BMI 32.22 kg/m   General: 89 y.o. year-old female well developed well nourished in no acute distress.  Somnolent and arousable to  voices. Cardiovascular: Regular rate and rhythm with no rubs or gallops.  No thyromegaly or JVD noted.  No lower extremity edema. 2/4 pulses in all 4 extremities. Respiratory: Faint rales at bases.  Poor inspiratory effort. Abdomen: Soft nontender nondistended with normal bowel sounds x4 quadrants. Muskuloskeletal: No cyanosis or clubbing noted bilaterally Neuro: CN II-XII intact, strength, sensation, reflexes Skin: No ulcerative lesions noted or rashes Psychiatry: Unable to assess judgment and mood due to somnolence.         Labs on Admission:  Basic Metabolic Panel: Recent Labs  Lab 12/24/23 1920  NA 135  K 4.5  CL 98  CO2 28  GLUCOSE 109*  BUN 16  CREATININE 1.41*  CALCIUM  9.4   Liver Function Tests: Recent Labs  Lab 12/24/23 1920  AST 23  ALT 16  ALKPHOS 76  BILITOT 0.9  PROT 7.4  ALBUMIN  4.4   No results for input(s): LIPASE, AMYLASE in the last 168 hours. No results for input(s): AMMONIA in the last 168 hours. CBC: Recent Labs  Lab 12/24/23 1920  WBC 9.7  HGB 16.8*  HCT 51.3*  MCV 97.0  PLT 163   Cardiac Enzymes: No results for input(s): CKTOTAL, CKMB, CKMBINDEX, TROPONINI in the last 168 hours.  BNP (last 3 results) Recent Labs    07/14/23 0451  BNP 329.0*    ProBNP (last 3 results) No results for input(s): PROBNP in the last 8760 hours.  CBG: No results for input(s): GLUCAP in the last 168 hours.  Radiological Exams on Admission: CT Angio Chest PE W and/or Wo Contrast Result Date: 12/24/2023 CLINICAL DATA:  Provided history: SOB, altered, hypoxic, cough. suspect pna, eval infiltrate/PE EXAM: CT ANGIOGRAPHY CHEST WITH CONTRAST TECHNIQUE: Multidetector CT imaging of the chest was performed using the standard protocol during bolus administration of intravenous contrast. Multiplanar CT image reconstructions and MIPs were obtained to evaluate the vascular anatomy. RADIATION DOSE REDUCTION: This exam was performed according to the  departmental dose-optimization program which includes automated exposure control, adjustment of the mA and/or kV according to patient size and/or use of iterative reconstruction technique. CONTRAST:  75mL OMNIPAQUE  IOHEXOL  350 MG/ML SOLN COMPARISON:  Radiograph earlier today.  Chest CT 03/09/2023 FINDINGS: Cardiovascular: There are no filling defects within the pulmonary arteries to suggest pulmonary embolus. The heart is enlarged. There are coronary artery calcifications. Aortic atherosclerosis. No aortic aneurysm. No pericardial effusion. Mediastinum/Nodes: Small hiatal hernia. Patulous esophagus. Small mediastinal and hilar lymph nodes are not enlarged by size criteria. No visible thyroid  nodule. Lungs/Pleura: Prominent bronchial thickening with areas of mucoid impaction. Heterogeneous pulmonary parenchyma but no confluent airspace disease or evidence of pneumonia. Narrowing and bowing of the trachea and central airways. 3 mm right middle lobe nodule series 5, image 79, unchanged from prior. The previous 2 mm left lower lobe nodule is obscured. Upper Abdomen: No acute findings. Mild left adrenal thickening is unchanged. Musculoskeletal: Left shoulder arthroplasty. Chronic T6 compression fracture. Diffuse degenerative change throughout the spine, stable. There are no acute or suspicious osseous abnormalities. Review of the MIP images confirms the above findings. IMPRESSION: 1. No pulmonary embolus. 2. Prominent bronchial thickening with areas of mucoid impaction, suggesting bronchitis. Heterogeneous pulmonary parenchyma may be due to small airways disease. 3. Narrowing and bowing of the trachea and central airways, can be seen with tracheobronchomalacia. 4. Cardiomegaly with coronary artery calcifications. 5. Small hiatal hernia. Aortic Atherosclerosis (ICD10-I70.0) . Electronically Signed   By: Andrea Gasman M.D.   On: 12/24/2023 21:20   DG Chest Portable 1 View Result Date: 12/24/2023 CLINICAL DATA:   Altered mental status and cough. EXAM: PORTABLE CHEST 1 VIEW COMPARISON:  July 18, 2023 FINDINGS: The cardiac silhouette is mildly enlarged and unchanged in size. There is marked severity calcification of the aortic arch. Chronic appearing diffusely increased interstitial lung markings are seen. This is very mildly increased in severity when compared to the prior study. Mild atelectatic changes are suspected within the bilateral lung bases. No pleural effusion or pneumothorax is identified. There is evidence of prior left shoulder arthroplasty. No acute osseous abnormality is identified. IMPRESSION: 1. Chronic appearing increased interstitial lung markings. A mild superimposed component of interstitial  edema cannot be excluded. 2. Mild bibasilar atelectasis. Electronically Signed   By: Suzen Dials M.D.   On: 12/24/2023 20:08    EKG: I independently viewed the EKG done and my findings are as followed: Sinus rhythm rate of 73.  Nonspecific ST-T changes.  QTc 432.  Assessment/Plan Present on Admission:  Sepsis due to pneumonia [facility])  Principal Problem:   Sepsis due to pneumonia (HCC)  Sepsis secondary to pneumonia Febrile with Tmax 103.2, acute metabolic encephalopathy, acute kidney injury. Monitor fever curve and WBCs Follow lactic acid level and procalcitonin level Continue IV antibiotics Rocephin and azithromycin started in the ED. As needed antitussives Maintain MAP greater than 65.  Acute hypoxic respiratory failure secondary to the above Not on oxygen supplementation at baseline Currently requiring 2 L to maintain O2 saturation above 92%. Wean off O2 supplementation as tolerated Bronchodilators Early mobilization as tolerated  Acute metabolic encephalopathy in the setting of sepsis Reorient as needed Avoid sedating agents.   Apparently more alert.  AKI, suspect multifactorial Baseline creatinine 0.85 with GFR of greater than 60 Presented with creatinine 1.41 with GFR  36 Avoid nephrotoxic agents and hypotension. Monitor urine output  Generalized weakness PT OT evaluation Fall precautions.  Hypothyroidism Resume levothyroxine .  Coronary artery disease Chronic HFpEF No reported anginal symptoms. Euvolemic on exam Monitor with strict I's and O's and daily weight  Dementia Reorient as needed.    Critical care time: 55 minutes.    DVT prophylaxis: Subcutaneous Lovenox  daily.  Code Status: Full code by default.  Family Communication: None at bedside.  Disposition Plan: Admitted to telemetry medical unit.  Consults called: None.  Admission status: Inpatient status.   Status is: Inpatient The patient requires at least 2 midnights for further evaluation and treatment of present condition.   Terry LOISE Hurst MD [facility] Pager [phone]  If 7PM-7AM, please contact night-coverage [email or web address] Password [email or web address]  12/24/2023, 10:10 PM

## 2023-12-24 NOTE — ED Provider Notes (Signed)
 [facility] Provider Note    Event Date/Time   First MD Initiated Contact with Patient 12/24/23 1920     (approximate)   History   Weakness, Cough, and Altered Mental Status   HPI  Karla  B Alexander is a 89 y.o. female who presents to the ED for evaluation of Weakness, Cough, and Altered Mental Status   Review of medical DC summary from April.  Admitted for melanotic stools.  She has history of dementia, CKD 3, GERD.  Patient presents from a local group home for evaluation of fever, cough, generalized weakness.  Found to be hypoxic on room air requiring nasal cannula.  She reports a cough for a while.  Reportedly more altered from her baseline dementia per staff  Physical Exam   Triage Vital Signs: ED Triage Vitals  Encounter Vitals Group     BP 12/24/23 1924 (!) 142/65     Girls Systolic BP Percentile --      Girls Diastolic BP Percentile --      Boys Systolic BP Percentile --      Boys Diastolic BP Percentile --      Pulse Rate 12/24/23 1924 72     Resp 12/24/23 1924 20     Temp 12/24/23 1931 (!) 103.2 F (39.6 C)     Temp Source 12/24/23 1931 Oral     SpO2 12/24/23 1924 (!) 87 %     Weight 12/24/23 1926 165 lb (74.8 kg)     Height 12/24/23 1926 5' (1.524 m)     Head Circumference --      Peak Flow --      Pain Score 12/24/23 1924 0     Pain Loc --      Pain Education --      Exclude from Growth Chart --     Most recent vital signs: Vitals:   12/24/23 2045 12/24/23 2100  BP:  138/60  Pulse: 70 68  Resp: 17   Temp:    SpO2: 94% 95%    General: Awake, no distress.  Frequently coughing.  Warm to the touch CV:  Good peripheral perfusion.  Resp:  Normal effort.  Abd:  No distention.  MSK:  No deformity noted.  Neuro:  No focal deficits appreciated. Other:     ED Results / Procedures / Treatments   Labs (all labs ordered are listed, but only abnormal results are displayed) Labs Reviewed  COMPREHENSIVE METABOLIC PANEL WITH  GFR - Abnormal; Notable for the following components:      Result Value   Glucose, Bld 109 (*)    Creatinine, Ser 1.41 (*)    GFR, Estimated 36 (*)    All other components within normal limits  CBC - Abnormal; Notable for the following components:   RBC 5.29 (*)    Hemoglobin 16.8 (*)    HCT 51.3 (*)    All other components within normal limits  URINALYSIS, ROUTINE W REFLEX MICROSCOPIC - Abnormal; Notable for the following components:   Color, Urine YELLOW (*)    APPearance HAZY (*)    Leukocytes,Ua TRACE (*)    Bacteria, UA RARE (*)    All other components within normal limits  RESP PANEL BY RT-PCR (RSV, FLU A&B, COVID)  RVPGX2  CULTURE, BLOOD (ROUTINE X 2)  CULTURE, BLOOD (ROUTINE X 2)  LACTIC ACID, PLASMA  LACTIC ACID, PLASMA  PROCALCITONIN  CBG MONITORING, ED    EKG Sinus rhythm at a rate of 73 bpm.  Normal axis and intervals without clear signs of acute ischemia.  RADIOLOGY 1 view CXR interpreted by me with interstitial increased markings and possible bibasilar infiltrates CTA chest interpreted by me without PE  Official radiology report(s): CT Angio Chest PE W and/or Wo Contrast Result Date: 12/24/2023 CLINICAL DATA:  Provided history: SOB, altered, hypoxic, cough. suspect pna, eval infiltrate/PE EXAM: CT ANGIOGRAPHY CHEST WITH CONTRAST TECHNIQUE: Multidetector CT imaging of the chest was performed using the standard protocol during bolus administration of intravenous contrast. Multiplanar CT image reconstructions and MIPs were obtained to evaluate the vascular anatomy. RADIATION DOSE REDUCTION: This exam was performed according to the departmental dose-optimization program which includes automated exposure control, adjustment of the mA and/or kV according to patient size and/or use of iterative reconstruction technique. CONTRAST:  75mL OMNIPAQUE  IOHEXOL  350 MG/ML SOLN COMPARISON:  Radiograph earlier today.  Chest CT 03/09/2023 FINDINGS: Cardiovascular: There are no filling  defects within the pulmonary arteries to suggest pulmonary embolus. The heart is enlarged. There are coronary artery calcifications. Aortic atherosclerosis. No aortic aneurysm. No pericardial effusion. Mediastinum/Nodes: Small hiatal hernia. Patulous esophagus. Small mediastinal and hilar lymph nodes are not enlarged by size criteria. No visible thyroid  nodule. Lungs/Pleura: Prominent bronchial thickening with areas of mucoid impaction. Heterogeneous pulmonary parenchyma but no confluent airspace disease or evidence of pneumonia. Narrowing and bowing of the trachea and central airways. 3 mm right middle lobe nodule series 5, image 79, unchanged from prior. The previous 2 mm left lower lobe nodule is obscured. Upper Abdomen: No acute findings. Mild left adrenal thickening is unchanged. Musculoskeletal: Left shoulder arthroplasty. Chronic T6 compression fracture. Diffuse degenerative change throughout the spine, stable. There are no acute or suspicious osseous abnormalities. Review of the MIP images confirms the above findings. IMPRESSION: 1. No pulmonary embolus. 2. Prominent bronchial thickening with areas of mucoid impaction, suggesting bronchitis. Heterogeneous pulmonary parenchyma may be due to small airways disease. 3. Narrowing and bowing of the trachea and central airways, can be seen with tracheobronchomalacia. 4. Cardiomegaly with coronary artery calcifications. 5. Small hiatal hernia. Aortic Atherosclerosis (ICD10-I70.0) . Electronically Signed   By: Andrea Gasman M.D.   On: 12/24/2023 21:20   DG Chest Portable 1 View Result Date: 12/24/2023 CLINICAL DATA:  Altered mental status and cough. EXAM: PORTABLE CHEST 1 VIEW COMPARISON:  July 18, 2023 FINDINGS: The cardiac silhouette is mildly enlarged and unchanged in size. There is marked severity calcification of the aortic arch. Chronic appearing diffusely increased interstitial lung markings are seen. This is very mildly increased in severity when  compared to the prior study. Mild atelectatic changes are suspected within the bilateral lung bases. No pleural effusion or pneumothorax is identified. There is evidence of prior left shoulder arthroplasty. No acute osseous abnormality is identified. IMPRESSION: 1. Chronic appearing increased interstitial lung markings. A mild superimposed component of interstitial edema cannot be excluded. 2. Mild bibasilar atelectasis. Electronically Signed   By: Suzen Dials M.D.   On: 12/24/2023 20:08    PROCEDURES and INTERVENTIONS:  .Critical Care  Performed by: Claudene Rover, MD Authorized by: Claudene Rover, MD   Critical care provider statement:    Critical care time (minutes):  30   Critical care time was exclusive of:  Separately billable procedures and treating other patients   Critical care was necessary to treat or prevent imminent or life-threatening deterioration of the following conditions:  Sepsis and respiratory failure   Critical care was time spent personally by me on the following activities:  Development of treatment  plan with patient or surrogate, discussions with consultants, evaluation of patient's response to treatment, examination of patient, ordering and review of laboratory studies, ordering and review of radiographic studies, ordering and performing treatments and interventions, pulse oximetry, re-evaluation of patient's condition and review of old charts .1-3 Lead EKG Interpretation  Performed by: Claudene Rover, MD Authorized by: Claudene Rover, MD     Interpretation: normal     ECG rate:  70   ECG rate assessment: normal     Rhythm: sinus rhythm     Ectopy: none     Conduction: normal     Medications  cefTRIAXone (ROCEPHIN) 2 g in sodium chloride  0.9 % 100 mL IVPB (has no administration in time range)  azithromycin (ZITHROMAX) 500 mg in sodium chloride  0.9 % 250 mL IVPB (has no administration in time range)  acetaminophen  (TYLENOL ) tablet 650 mg (650 mg Oral Given 12/24/23  2022)  iohexol  (OMNIPAQUE ) 350 MG/ML injection 75 mL (75 mLs Intravenous Contrast Given 12/24/23 2057)     IMPRESSION / MDM / ASSESSMENT AND PLAN / ED COURSE  I reviewed the triage vital signs and the nursing notes.  Differential diagnosis includes, but is not limited to, pneumonia, pneumothorax, bronchitis, viral syndrome such as COVID-19, symptomatic anemia, meningitis or encephalitis  {Patient presents with symptoms of an acute illness or injury that is potentially life-threatening.  Patient presents with fever and cough concerning for pneumonia with signs of sepsis requiring medical admission.  Found to be hypoxic on room air requiring nasal cannula, febrile but stable without signs of shock or instability.  Normal CBC and electrolytes, negative lactic acid, negative viral swabs and urine without infectious features.  Start antibiotics due to high suspicion for pneumonia.  CTA chest without PE.  Consult medicine for admission.  No clear meningeal features, less likely meningitis/encephalitis as the cause of her altered mentation more likely metabolic encephalopathy from respiratory illness.  Clinical Course as of 12/24/23 2206  Fri Dec 24, 2023  2206 I consulted medicine who agrees to admit [DS]    Clinical Course User Index [DS] Claudene Rover, MD     FINAL CLINICAL IMPRESSION(S) / ED DIAGNOSES   Final diagnoses:  Sepsis due to pneumonia [facility])  Hypoxia     Rx / DC Orders   ED Discharge Orders     None        Note:  This document was prepared using Dragon voice recognition software and may include unintentional dictation errors.   Claudene Rover, MD 12/24/23 2151

## 2023-12-24 NOTE — Progress Notes (Signed)
 PHARMACIST - PHYSICIAN COMMUNICATION  CONCERNING:  Enoxaparin  (Lovenox ) for DVT Prophylaxis    RECOMMENDATION: Patient was prescribed enoxaprin 40mg  q24 hours for VTE prophylaxis.   Filed Weights   12/24/23 1926 12/24/23 2249  Weight: 74.8 kg (165 lb) 64.4 kg (141 lb 15.6 oz)    Body mass index is 27.73 kg/m.  Estimated Creatinine Clearance: 22.7 mL/min (A) (by C-G formula based on SCr of 1.41 mg/dL (H)).  Patient is candidate for enoxaparin  30mg  every 24 hours based on CrCl <80ml/min or Weight <45kg  DESCRIPTION: Pharmacy has adjusted enoxaparin  dose per [facility] policy.  Patient is now receiving enoxaparin  30 mg every 24 hours   Rankin CANDIE Dills, PharmD, [facility] 12/24/2023 11:10 PM

## 2023-12-25 DIAGNOSIS — I10 Essential (primary) hypertension: Secondary | ICD-10-CM

## 2023-12-25 DIAGNOSIS — J40 Bronchitis, not specified as acute or chronic: Secondary | ICD-10-CM

## 2023-12-25 DIAGNOSIS — J189 Pneumonia, unspecified organism: Secondary | ICD-10-CM | POA: Diagnosis not present

## 2023-12-25 DIAGNOSIS — N179 Acute kidney failure, unspecified: Secondary | ICD-10-CM

## 2023-12-25 DIAGNOSIS — G9341 Metabolic encephalopathy: Secondary | ICD-10-CM | POA: Diagnosis not present

## 2023-12-25 DIAGNOSIS — Z9189 Other specified personal risk factors, not elsewhere classified: Secondary | ICD-10-CM

## 2023-12-25 DIAGNOSIS — J9601 Acute respiratory failure with hypoxia: Secondary | ICD-10-CM | POA: Diagnosis not present

## 2023-12-25 DIAGNOSIS — R531 Weakness: Secondary | ICD-10-CM

## 2023-12-25 DIAGNOSIS — R197 Diarrhea, unspecified: Secondary | ICD-10-CM

## 2023-12-25 DIAGNOSIS — I5032 Chronic diastolic (congestive) heart failure: Secondary | ICD-10-CM

## 2023-12-25 DIAGNOSIS — Z8659 Personal history of other mental and behavioral disorders: Secondary | ICD-10-CM

## 2023-12-25 DIAGNOSIS — E039 Hypothyroidism, unspecified: Secondary | ICD-10-CM

## 2023-12-25 LAB — C DIFFICILE QUICK SCREEN W PCR REFLEX
C Diff antigen: NEGATIVE
C Diff interpretation: NOT DETECTED
C Diff toxin: NEGATIVE

## 2023-12-25 LAB — RESPIRATORY PANEL BY PCR

## 2023-12-25 LAB — CBC
HCT: 49.3 % — ABNORMAL HIGH (ref 36.0–46.0)
Hemoglobin: 16.2 g/dL — ABNORMAL HIGH (ref 12.0–15.0)
MCH: 31.8 pg (ref 26.0–34.0)
MCHC: 32.9 g/dL (ref 30.0–36.0)
MCV: 96.9 fL (ref 80.0–100.0)
Platelets: 158 K/uL (ref 150–400)
RBC: 5.09 MIL/uL (ref 3.87–5.11)
RDW: 12.7 % (ref 11.5–15.5)
WBC: 7.8 K/uL (ref 4.0–10.5)
nRBC: 0 % (ref 0.0–0.2)

## 2023-12-25 LAB — BASIC METABOLIC PANEL WITH GFR
Anion gap: 8 (ref 5–15)
BUN: 17 mg/dL (ref 8–23)
CO2: 27 mmol/L (ref 22–32)
Calcium: 8.7 mg/dL — ABNORMAL LOW (ref 8.9–10.3)
Chloride: 100 mmol/L (ref 98–111)
Creatinine, Ser: 1.09 mg/dL — ABNORMAL HIGH (ref 0.44–1.00)
GFR, Estimated: 49 mL/min — ABNORMAL LOW (ref >60)
Glucose, Bld: 83 mg/dL (ref 70–99)
Potassium: 4 mmol/L (ref 3.5–5.1)
Sodium: 135 mmol/L (ref 135–145)

## 2023-12-25 LAB — GLUCOSE, CAPILLARY: Glucose-Capillary: 102 mg/dL — ABNORMAL HIGH (ref 70–99)

## 2023-12-25 LAB — PROCALCITONIN: Procalcitonin: <0.1 ng/mL

## 2023-12-25 LAB — PHOSPHORUS: Phosphorus: 3 mg/dL (ref 2.5–4.6)

## 2023-12-25 LAB — TSH: TSH: 10.992 u[IU]/mL — ABNORMAL HIGH (ref 0.350–4.500)

## 2023-12-25 LAB — LACTIC ACID, PLASMA: Lactic Acid, Venous: 1 mmol/L (ref 0.5–1.9)

## 2023-12-25 LAB — OCCULT BLOOD X 1 CARD TO LAB, STOOL: Fecal Occult Bld: NEGATIVE

## 2023-12-25 LAB — MAGNESIUM: Magnesium: 1.8 mg/dL (ref 1.7–2.4)

## 2023-12-25 MED ORDER — PANTOPRAZOLE SODIUM 40 MG PO TBEC
40.0000 mg | DELAYED_RELEASE_TABLET | Freq: Every day | ORAL | Status: DC
  Administered 2023-12-25 – 2023-12-26 (×2): 40 mg via ORAL
  Filled 2023-12-25 (×2): qty 1

## 2023-12-25 MED ORDER — DICLOFENAC SODIUM 1 % EX GEL: 2.0000 g | Freq: Four times a day (QID) | CUTANEOUS | Status: DC | PRN

## 2023-12-25 MED ORDER — IPRATROPIUM-ALBUTEROL 0.5-2.5 (3) MG/3ML IN SOLN: 3.0000 mL | Freq: Four times a day (QID) | RESPIRATORY_TRACT | Status: DC

## 2023-12-25 MED ORDER — GUAIFENESIN-DM 100-10 MG/5ML PO SYRP
5.0000 mL | ORAL_SOLUTION | ORAL | Status: DC | PRN
  Administered 2023-12-26: 5 mL via ORAL
  Filled 2023-12-25: qty 10

## 2023-12-25 MED ORDER — FERROUS SULFATE 325 (65 FE) MG PO TABS
325.0000 mg | ORAL_TABLET | Freq: Every day | ORAL | Status: DC
  Administered 2023-12-26: 325 mg via ORAL
  Filled 2023-12-25: qty 1

## 2023-12-25 MED ORDER — IPRATROPIUM-ALBUTEROL 0.5-2.5 (3) MG/3ML IN SOLN
3.0000 mL | RESPIRATORY_TRACT | Status: AC
  Administered 2023-12-25: 3 mL via RESPIRATORY_TRACT
  Filled 2023-12-25: qty 3

## 2023-12-25 MED ORDER — LEVOTHYROXINE SODIUM 112 MCG PO TABS
112.0000 ug | ORAL_TABLET | Freq: Every day | ORAL | Status: DC
  Administered 2023-12-26: 112 ug via ORAL
  Filled 2023-12-25 (×2): qty 1

## 2023-12-25 MED ORDER — DONEPEZIL HCL 5 MG PO TABS
10.0000 mg | ORAL_TABLET | Freq: Every day | ORAL | Status: DC
  Administered 2023-12-25: 10 mg via ORAL
  Filled 2023-12-25: qty 2

## 2023-12-25 MED ORDER — ACETAMINOPHEN 325 MG PO TABS
650.0000 mg | ORAL_TABLET | Freq: Four times a day (QID) | ORAL | Status: DC | PRN
  Administered 2023-12-25: 650 mg via ORAL
  Filled 2023-12-25: qty 2

## 2023-12-25 MED ORDER — ISOSORBIDE MONONITRATE 20 MG PO TABS
10.0000 mg | ORAL_TABLET | Freq: Two times a day (BID) | ORAL | Status: DC
  Administered 2023-12-25 – 2023-12-26 (×3): 10 mg via ORAL
  Filled 2023-12-25 (×4): qty 1

## 2023-12-25 MED ORDER — PROCHLORPERAZINE EDISYLATE 10 MG/2ML IJ SOLN: 5.0000 mg | Freq: Four times a day (QID) | INTRAMUSCULAR | Status: DC | PRN

## 2023-12-25 MED ORDER — FLUTICASONE PROPIONATE 50 MCG/ACT NA SUSP
1.0000 | Freq: Two times a day (BID) | NASAL | Status: DC
  Administered 2023-12-25 – 2023-12-26 (×3): 1 via NASAL
  Filled 2023-12-25: qty 16

## 2023-12-25 MED ORDER — POLYVINYL ALCOHOL 1.4 % OP SOLN
1.0000 [drp] | Freq: Three times a day (TID) | OPHTHALMIC | Status: DC
  Administered 2023-12-25 – 2023-12-26 (×3): 1 [drp] via OPHTHALMIC
  Filled 2023-12-25: qty 15

## 2023-12-25 MED ORDER — SERTRALINE HCL 50 MG PO TABS
75.0000 mg | ORAL_TABLET | Freq: Every day | ORAL | Status: DC
  Administered 2023-12-25 – 2023-12-26 (×2): 75 mg via ORAL
  Filled 2023-12-25 (×2): qty 2

## 2023-12-25 MED ORDER — LORAZEPAM 0.5 MG PO TABS: 0.5000 mg | ORAL_TABLET | Freq: Two times a day (BID) | ORAL | Status: DC | PRN

## 2023-12-25 MED ORDER — POLYETHYLENE GLYCOL 3350 17 G PO PACK: 17.0000 g | PACK | Freq: Every day | ORAL | Status: DC | PRN

## 2023-12-25 MED ORDER — LINACLOTIDE 145 MCG PO CAPS
145.0000 ug | ORAL_CAPSULE | Freq: Every day | ORAL | Status: DC
  Administered 2023-12-25 – 2023-12-26 (×2): 145 ug via ORAL
  Filled 2023-12-25 (×2): qty 1

## 2023-12-25 MED ORDER — ROPINIROLE HCL 1 MG PO TABS
1.0000 mg | ORAL_TABLET | Freq: Every day | ORAL | Status: DC
  Administered 2023-12-25: 1 mg via ORAL
  Filled 2023-12-25: qty 1

## 2023-12-25 MED ORDER — AMLODIPINE BESYLATE 5 MG PO TABS
5.0000 mg | ORAL_TABLET | Freq: Every day | ORAL | Status: DC
  Administered 2023-12-25 – 2023-12-26 (×2): 5 mg via ORAL
  Filled 2023-12-25 (×2): qty 1

## 2023-12-25 MED ORDER — LIDOCAINE 5 % EX PTCH
1.0000 | MEDICATED_PATCH | CUTANEOUS | Status: DC
  Administered 2023-12-25: 1 via TRANSDERMAL
  Filled 2023-12-25: qty 1

## 2023-12-25 MED ORDER — IPRATROPIUM-ALBUTEROL 0.5-2.5 (3) MG/3ML IN SOLN
3.0000 mL | Freq: Three times a day (TID) | RESPIRATORY_TRACT | Status: DC
  Administered 2023-12-25 – 2023-12-26 (×5): 3 mL via RESPIRATORY_TRACT
  Filled 2023-12-25 (×4): qty 3

## 2023-12-25 NOTE — Hospital Course (Addendum)
 Partly taken from H&P.  Karla  B Alexander is a 89 y.o. female with medical history significant for dementia, hypertension, CAD, chronic HFpEF, who presented to the ER from group home with altered mental status.  Associated with a cough that began a few days ago.  Reportedly, the patient was febrile at the facility and was having generalized weakness.  On presentation patient was febrile at 103.2, hypoxia requiring up to 3 L of oxygen with no baseline oxygen use, somnolent but arousable, UA was not suggestive of UTI, labs shows AKI with creatinine of 1.41, baseline around 1, procalcitonin negative, respiratory panel negative for COVID, RSV and influenza. CTA chest was negative for PE, did show peribronchial thickening with areas of mucoid impaction suggesting bronchitis.  Patient was started on Zithromax and ceftriaxone.  10/4: Vital stable on 3 L of oxygen.  Checking RVP to rule out other viral causes of bronchitis, preliminary blood cultures negative.  Concern of polypharmacy causing increased somnolence as patient was on gabapentin  800 mg 3 times daily-dose was recently increased, Ativan twice daily and trazodone 100 mg at night.  Holding gabapentin  and trazodone, making Ativan as as needed.  Heart rate mostly in 50s and low 60s so holding metoprolol , restarting amlodipine  5 mg at this time and we will reintroduce antihypertensives as blood pressure allows.  10/5: Patient with significant improvement, walking around on room air without any difficulty.  Mentation at baseline.  Patient likely has a bronchitis secondary to rhinovirus as expanded RVP positive for rhinovirus.  Renal functions continue to improve.  Her altered mental status and increased somnolence was most likely secondary to multiple sedating medications.  We decreased her home gabapentin  from 800 3 times daily to 400 twice daily, make Ativan as as needed-please do not stop cold [place] rather she will need slow taper as she was taking  that medicine for a long time.  I also decreased the dose of trazodone from 100 to 50 mg and make it as needed.  There was no wheezing, she was given a prescription for 3 days of Zithromax to complete the course.   Patient should avoid excessive sedation and need to have a close follow-up with her primary care provider for further assistance

## 2023-12-25 NOTE — Progress Notes (Signed)
 Progress Note   Patient: Karla Alexander FMW:[id number] DOB: 01-Dec-1932 DOA: 12/24/2023     1 DOS: the patient was seen and examined on 12/25/2023   Brief hospital course: Partly taken from H&P.  Jadence  B Newsom is a 89 y.o. female with medical history significant for dementia, hypertension, CAD, chronic HFpEF, who presented to the ER from group home with altered mental status.  Associated with a cough that began a few days ago.  Reportedly, the patient was febrile at the facility and was having generalized weakness.  On presentation patient was febrile at 103.2, hypoxia requiring up to 3 L of oxygen with no baseline oxygen use, somnolent but arousable, UA was not suggestive of UTI, labs shows AKI with creatinine of 1.41, baseline around 1, procalcitonin negative, respiratory panel negative for COVID, RSV and influenza. CTA chest was negative for PE, did show peribronchial thickening with areas of mucoid impaction suggesting bronchitis.  Patient was started on Zithromax and ceftriaxone.  10/4: Vital stable on 3 L of oxygen.  Checking RVP to rule out other viral causes of bronchitis, preliminary blood cultures negative.  Concern of polypharmacy causing increased somnolence as patient was on gabapentin  800 mg 3 times daily-dose was recently increased, Ativan twice daily and trazodone 100 mg at night.  Holding gabapentin  and trazodone, making Ativan as as needed.  Heart rate mostly in 50s and low 60s so holding metoprolol , restarting amlodipine  5 mg at this time and we will reintroduce antihypertensives as blood pressure allows.  Assessment and Plan: * Sepsis due to pneumonia (HCC) Bronchitis. Imaging is not consistent with pneumonia, likely a bronchitis.  Procalcitonin negative. Pneumonia ruled out.  Respiratory panel negative. -Checking expanded RVP for bronchitis - Discontinuing ceftriaxone -Continue with Zithromax -Continue with supportive care  Acute hypoxic respiratory failure  (HCC) Currently on 3 L of oxygen, with no baseline oxygen use.  Likely secondary to bronchitis. - Continue supplemental oxygen-wean as tolerated  Acute metabolic encephalopathy Seems multifactorial with poor p.o. intake and concern of polypharmacy causing increased somnolence. Her home gabapentin  dose was recently increased to 800 mg 3 times a day. - Holding sedating medications -Continue to monitor -Delirium precautions  At risk for polypharmacy Patient was on multiple sedating medications at home, which include high-dose gabapentin , trazodone and Ativan. - Holding gabapentin  and trazodone -Making Ativan as needed  AKI (acute kidney injury) Likely secondary to poor p.o. intake and continue taking home Lasix  and spironolactone.  Creatinine improved to 1.09, with baseline little less than 1 - Monitor renal function - Continue with IV fluid for another day - Avoid nephrotoxins   Essential hypertension Blood pressure mildly elevated. - Restarting home amlodipine  -Holding Lasix  and spironolactone for concern of AKI and patient appears dehydrated  Chronic heart failure with preserved ejection fraction (HFpEF) (HCC) Echocardiogram many years ago with normal EF and grade 2 diastolic dysfunction. Clinically appears euvolemic. - Holding home Lasix  and spironolactone -Holding metoprolol  due to borderline bradycardia -Monitor volume status closely as patient is getting some IV fluid  Generalized weakness - PT/OT evaluation  History of dementia - Continue home  Aricept  -Delirium precautions  Hypothyroidism Check TSH -Continue with home Synthroid   Diarrhea Also concern of dark-colored stool.  Hemoglobin seems stable. - Check FOBT -Ordered C. difficile testing   Subjective: Patient was seen and examined today.  Oriented to self only.  Seems like having significant underlying dementia.  Appetite poor, having some cough and congestion.  Physical Exam: Vitals:   12/25/23 0201  12/25/23 0500  12/25/23 0714 12/25/23 0726  BP: 119/63  (!) 132/54   Pulse: (!) 57  62   Resp: 16  16   Temp: 98.2 F (36.8 C)  98.2 F (36.8 C)   TempSrc:      SpO2: 91%  92% 95%  Weight:  64.4 kg    Height:       General.  Frail elderly lady, in no acute distress. Pulmonary.  Few scattered rhonchi bilaterally, normal respiratory effort. CV.  Regular rate and rhythm, no JVD, rub or murmur. Abdomen.  Soft, nontender, nondistended, BS positive. CNS.  Little somnolent but easily arousable, oriented to self only.  No focal neurologic deficit. Extremities.  No edema, no cyanosis, pulses intact and symmetrical.   Data Reviewed: Prior data reviewed  Family Communication: Tried calling legal guardian with no response  Disposition: Status is: Inpatient Remains inpatient appropriate because: Severity of illness  Planned Discharge Destination: Home  DVT prophylaxis.  Lovenox  Time spent: 50 minutes  This record has been created using Conservation officer, historic buildings. Errors have been sought and corrected,but may not always be located. Such creation errors do not reflect on the standard of care.   Author: Amaryllis Dare, MD 12/25/2023 2:28 PM  For on call review [email or web address].

## 2023-12-25 NOTE — Assessment & Plan Note (Signed)
 Continue home Aricept. Delirium precautions

## 2023-12-25 NOTE — Assessment & Plan Note (Signed)
 Likely secondary to poor p.o. intake and continue taking home Lasix  and spironolactone.  Creatinine improved to 1.09, with baseline little less than 1 - Monitor renal function - Continue with IV fluid for another day - Avoid nephrotoxins

## 2023-12-25 NOTE — Assessment & Plan Note (Signed)
 Blood pressure mildly elevated. - Restarting home amlodipine  -Holding Lasix  and spironolactone for concern of AKI and patient appears dehydrated

## 2023-12-25 NOTE — Assessment & Plan Note (Signed)
 Check TSH -Continue with home Synthroid 

## 2023-12-25 NOTE — Assessment & Plan Note (Signed)
 Seems multifactorial with poor p.o. intake and concern of polypharmacy causing increased somnolence. Her home gabapentin  dose was recently increased to 800 mg 3 times a day. - Holding sedating medications -Continue to monitor -Delirium precautions

## 2023-12-25 NOTE — Progress Notes (Signed)
 Patient arrived on unit from the ED,patient drowsy respond to pain.3 liters oxygen via [place],vital signs done and recorded.Tele monitor applied,on call provider notified  measures maintained

## 2023-12-25 NOTE — Plan of Care (Signed)

## 2023-12-25 NOTE — Assessment & Plan Note (Signed)
-

## 2023-12-25 NOTE — Assessment & Plan Note (Signed)
 Patient was on multiple sedating medications at home, which include high-dose gabapentin , trazodone and Ativan. - Holding gabapentin  and trazodone -Making Ativan as needed

## 2023-12-25 NOTE — Assessment & Plan Note (Signed)
 Currently on 3 L of oxygen, with no baseline oxygen use.  Likely secondary to bronchitis. - Continue supplemental oxygen-wean as tolerated

## 2023-12-25 NOTE — Assessment & Plan Note (Signed)
 Bronchitis. Imaging is not consistent with pneumonia, likely a bronchitis.  Procalcitonin negative. Pneumonia ruled out.  Respiratory panel negative. -Checking expanded RVP for bronchitis - Discontinuing ceftriaxone -Continue with Zithromax -Continue with supportive care

## 2023-12-25 NOTE — Assessment & Plan Note (Signed)
 Also concern of dark-colored stool.  Hemoglobin seems stable. - Check FOBT -Ordered C. difficile testing

## 2023-12-25 NOTE — Assessment & Plan Note (Signed)
 Echocardiogram many years ago with normal EF and grade 2 diastolic dysfunction. Clinically appears euvolemic. - Holding home Lasix  and spironolactone -Holding metoprolol  due to borderline bradycardia -Monitor volume status closely as patient is getting some IV fluid

## 2023-12-25 NOTE — Plan of Care (Signed)

## 2023-12-26 ENCOUNTER — Other Ambulatory Visit: Payer: Self-pay

## 2023-12-26 DIAGNOSIS — J40 Bronchitis, not specified as acute or chronic: Secondary | ICD-10-CM | POA: Diagnosis not present

## 2023-12-26 DIAGNOSIS — J189 Pneumonia, unspecified organism: Secondary | ICD-10-CM | POA: Diagnosis not present

## 2023-12-26 DIAGNOSIS — G9341 Metabolic encephalopathy: Secondary | ICD-10-CM | POA: Diagnosis not present

## 2023-12-26 DIAGNOSIS — R0902 Hypoxemia: Secondary | ICD-10-CM

## 2023-12-26 DIAGNOSIS — J9601 Acute respiratory failure with hypoxia: Secondary | ICD-10-CM | POA: Diagnosis not present

## 2023-12-26 LAB — BASIC METABOLIC PANEL WITH GFR
Anion gap: 14 (ref 5–15)
BUN: 14 mg/dL (ref 8–23)
CO2: 25 mmol/L (ref 22–32)
Calcium: 9.7 mg/dL (ref 8.9–10.3)
Chloride: 96 mmol/L — ABNORMAL LOW (ref 98–111)
Creatinine, Ser: 1.07 mg/dL — ABNORMAL HIGH (ref 0.44–1.00)
GFR, Estimated: 50 mL/min — ABNORMAL LOW (ref >60)
Glucose, Bld: 141 mg/dL — ABNORMAL HIGH (ref 70–99)
Potassium: 3.8 mmol/L (ref 3.5–5.1)
Sodium: 135 mmol/L (ref 135–145)

## 2023-12-26 MED ORDER — GABAPENTIN 400 MG PO CAPS
400.0000 mg | ORAL_CAPSULE | Freq: Two times a day (BID) | ORAL | 0 refills | Status: AC
  Filled 2023-12-26: qty 60, 30d supply, fill #0

## 2023-12-26 MED ORDER — DEXTROMETHORPHAN-GUAIFENESIN 20-200 MG/20ML PO LIQD
5.0000 mL | ORAL | 0 refills | Status: AC | PRN
  Filled 2023-12-26: qty 118, 4d supply, fill #0

## 2023-12-26 MED ORDER — AZITHROMYCIN 250 MG PO TABS
250.0000 mg | ORAL_TABLET | Freq: Every day | ORAL | 0 refills | Status: AC
  Filled 2023-12-26: qty 3, 3d supply, fill #0

## 2023-12-26 MED ORDER — LORAZEPAM 0.5 MG PO TABS: 0.5000 mg | ORAL_TABLET | Freq: Two times a day (BID) | ORAL | Status: AC | PRN

## 2023-12-26 MED ORDER — TRAZODONE HCL 50 MG PO TABS
50.0000 mg | ORAL_TABLET | Freq: Every evening | ORAL | 0 refills | Status: AC | PRN
  Filled 2023-12-26: qty 30, 30d supply, fill #0

## 2023-12-26 NOTE — Plan of Care (Signed)
  Problem: Education: Goal: Knowledge of General Education information will improve Description: Including pain rating scale, medication(s)/side effects and non-pharmacologic comfort measures Outcome: Progressing   Problem: Health Behavior/Discharge Planning: Goal: Ability to manage health-related needs will improve Outcome: Progressing   Problem: Clinical Measurements: Goal: Ability to maintain clinical measurements within normal limits will improve Outcome: Progressing   Problem: Clinical Measurements: Goal: Ability to maintain clinical measurements within normal limits will improve Outcome: Progressing Goal: Will remain free from infection Outcome: Progressing Goal: Diagnostic test results will improve Outcome: Progressing Goal: Respiratory complications will improve Outcome: Progressing Goal: Cardiovascular complication will be avoided Outcome: Progressing   Problem: Activity: Goal: Risk for activity intolerance will decrease Outcome: Progressing

## 2023-12-26 NOTE — Progress Notes (Signed)
 Report given to Clenent at the group home reflected this patient current status

## 2023-12-26 NOTE — TOC Transition Note (Addendum)
 Transition of Care [facility]) - Discharge Note   Patient Details  Name: Karla  ZOIE Alexander MRN: [id number] Date of Birth: 1933-01-06  Transition of Care [facility]) CM/SW Contact:  Victory Jackquline RAMAN, RN Phone Number: 12/26/2023, 3:27 PM   Clinical Narrative:   Chart reviewed. RNCM, spoke with Lauraine on call DSS Worker, I introduced myself, my role, and explained that I was calling to notify her that the patient was being discharged back to [facility] with HH/PT. She stated that it was okay to discharge patient back to group home. She also called [place] C[place]ier A[facility] DSS worker and informed him and he stated that he would email [place] DSS (Legal Guardian) [phone] to let him know as well since I wasn't able to reach him. [facility] was called and they said that they are able to accept patient and she would give Shona the information. Patient has discharge orders for today. No further concerns. RNCM Signing off.  1545: Spoke to [facility] with [facility] and she confirmed that she accepted the patient and will be out to see her this week.  Final next level of care: Group Home   Patient Goals and CMS Choice            Discharge Placement                Patient to be transferred to facility by: [facility] Name of family member notified: Legal GuardianHaizlip,[place] DSS (Legal Guardian)  [phone], and On Call DSS Myrla Lauraine @ [phone] Patient and family notified of of transfer: 12/26/23  Discharge Plan and Services Additional resources added to the After Visit Summary for                            [facility] Arranged: PT [facility] Agency: [facility] Date [facility] Agency Contacted: 12/26/23   Representative spoke with at [facility] Agency: Sadie @ [phone]  Social Drivers of Health (SDOH) Interventions SDOH Screenings   Food Insecurity: No Food Insecurity (12/25/2023)  Housing: Low Risk  (12/25/2023)  Transportation Needs: No Transportation Needs (12/25/2023)  Utilities: Not At  Risk (12/25/2023)  Depression (PHQ2-9): Low Risk  (02/23/2023)  Social Connections: Socially Isolated (12/25/2023)  Tobacco Use: Low Risk  (12/24/2023)     Readmission Risk Interventions     No data to display

## 2023-12-26 NOTE — Evaluation (Signed)
 Occupational Therapy Evaluation Patient Details Name: Karla Alexander MRN: [id number] DOB: 04-23-1932 Today's Date: 12/26/2023   History of Present Illness   89 y.o. female with medical history significant for dementia, hypertension, CAD, chronic HFpEF, who presented to the ER from group home with altered mental status.  Associated with a cough that began a few days ago.  Reportedly, the patient was febrile at the facility and was having generalized weakness.     Clinical Impressions Upon entering the room, pt coming out of bathroom with RW and sitting on EOB. Pt's gown soiled with BM. Pt endorses having been incontinent of BM. OT providing pt with needed items and she changed gown and washed body without assistance. However, once she was cleaned she began to be incontinent on the floor and went back to bathroom and cleansed herself again from [facility] in same manner. Pt seated on EOB when therapist left room. Pt from group home with staff to provide supervision as she has cognitive deficits at baseline. She endorses being Ing with self care tasks and has a RW that she does not use. Pt does not need skilled acute OT intervention at this time.      If plan is discharge home, recommend the following:   Supervision due to cognitive status     Functional Status Assessment   Patient has not had a recent decline in their functional status     Equipment Recommendations   None recommended by OT      Precautions/Restrictions   Precautions Precautions: Fall     Mobility Bed Mobility Overal bed mobility: Modified Independent                  Transfers Overall transfer level: Modified independent Equipment used: None               General transfer comment: Pt able to rise up to standing w/o assist, general unsteadiness w/o LOBs or overt issues      Balance Overall balance assessment: Modified Independent                                          ADL either performed or assessed with clinical judgement   ADL Overall ADL's : At baseline                                             Vision Patient Visual Report: No change from baseline              Pertinent Vitals/Pain Pain Assessment Pain Assessment: No/denies pain     Extremity/Trunk Assessment Upper Extremity Assessment Upper Extremity Assessment: Overall WFL for tasks assessed   Lower Extremity Assessment Lower Extremity Assessment: Defer to PT evaluation       Communication Communication Communication: No apparent difficulties   Cognition Arousal: Alert Behavior During Therapy: WFL for tasks assessed/performed Cognition: History of cognitive impairments             OT - Cognition Comments: hx of dementia                 Following commands: Impaired Following commands impaired: Only follows one step commands consistently     Cueing  General Comments   Cueing Techniques: Verbal cues  Pt moved relatively well  in the room and with ambulation in the hallway w/o AD.           Home Living Family/patient expects to be discharged to:: Group home Living Arrangements: Group Home Available Help at Discharge: Other (Comment) (group home) Type of Home: Group Home       Home Layout: One level     Bathroom Shower/Tub: Tub/shower unit         Home Equipment: Agricultural consultant (2 wheels)   Additional Comments: reports she does not use RW but does have one      Prior Functioning/Environment Prior Level of Function : Independent/Modified Independent             Mobility Comments: not using AD prior to admission ADLs Comments: independent     AM-PAC OT 6 Clicks Daily Activity     Outcome Measure Help from another person eating meals?: None Help from another person taking care of personal grooming?: None Help from another person toileting, which includes using toliet, bedpan, or urinal?: None Help from  another person bathing (including washing, rinsing, drying)?: None Help from another person to put on and taking off regular upper body clothing?: None Help from another person to put on and taking off regular lower body clothing?: None 6 Click Score: 24   End of Session    Activity Tolerance: Patient tolerated treatment well Patient left: in bed;with call bell/phone within reach                   Time: 1102-1116 OT Time Calculation (min): 14 min Charges:  OT General Charges $OT Visit: 1 Visit OT Evaluation $OT Eval Low Complexity: 1 Low OT Treatments $Self Care/Home Management : 8-22 mins  Izetta Claude, MS, OTR/L , CBIS ascom [phone]  12/26/23, 12:42 PM

## 2023-12-26 NOTE — Evaluation (Signed)
 Physical Therapy Evaluation Patient Details Name: Karla Alexander MRN: [id number] DOB: 06-21-1932 Today's Date: 12/26/2023  History of Present Illness  89 y.o. female with medical history significant for dementia, hypertension, CAD, chronic HFpEF, who presented to the ER from group home with altered mental status.  Associated with a cough that began a few days ago.  Reportedly, the patient was febrile at the facility and was having generalized weakness.  Clinical Impression  Pt was pleasant and eager to participate.  She showed good overall effort but did need consistent reinforcement to stay on task and insure safety.  She displayed some general confusion but knew to look at the board for date and setting and generally was able to hold appropriate conversation t/o session.  Pt needed little actual physical assist and apart from very mild shuffling was able to ambulate w/o AD with relative confidence.  Pt will benefit from continued PT to address functional limitations.        If plan is discharge home, recommend the following: A little help with walking and/or transfers;A little help with bathing/dressing/bathroom   Can travel by private vehicle        Equipment Recommendations None recommended by PT  Recommendations for Other Services       Functional Status Assessment Patient has had a recent decline in their functional status and demonstrates the ability to make significant improvements in function in a reasonable and predictable amount of time.     Precautions / Restrictions Precautions Precautions: Fall Restrictions Weight Bearing Restrictions Per Provider Order: No      Mobility  Bed Mobility Overal bed mobility: Modified Independent             General bed mobility comments: Pt was able to get up to sitting EOB    Transfers Overall transfer level: Modified independent Equipment used: None               General transfer comment: Pt able to rise up to  standing w/o assist, general unsteadiness w/o LOBs or overt issues    Ambulation/Gait Ambulation/Gait assistance: Supervision Gait Distance (Feet): 125 Feet Assistive device: Rolling walker (2 wheels), IV Pole         General Gait Details: first 50 ft with AD, no AD for remainder.  Slow but steady gait and did not have overt LOBs, supervision w/o need for direct invervention - consistent directional cunig 2/2 confusion and reinforcement to remain on task  Stairs            Wheelchair Mobility     Tilt Bed    Modified Rankin (Stroke Patients Only)       Balance Overall balance assessment: Modified Independent                                           Pertinent Vitals/Pain Pain Assessment Pain Assessment: No/denies pain    Home Living Family/patient expects to be discharged to:: Group home Living Arrangements: Group Home Available Help at Discharge:  (group home staff) Type of Home: Group Home           Home Equipment:  (states she does not use a walker, but does have one)      Prior Function Prior Level of Function : Independent/Modified Independent             Mobility Comments: not using AD prior to admission  ADLs Comments: independent     Extremity/Trunk Assessment   Upper Extremity Assessment Upper Extremity Assessment: Generalized weakness    Lower Extremity Assessment Lower Extremity Assessment: Generalized weakness       Communication   Communication Communication: No apparent difficulties    Cognition Arousal: Alert     PT - Cognitive impairments: History of cognitive impairments                       PT - Cognition Comments: pleasantly confused Following commands: Impaired Following commands impaired: Follows one step commands inconsistently     Cueing       General Comments General comments (skin integrity, edema, etc.): Pt moved relatively well in the room and with ambulation in the  hallway w/o AD.    Exercises     Assessment/Plan    PT Assessment Patient needs continued PT services  PT Problem List Decreased strength;Decreased range of motion;Decreased activity tolerance;Decreased balance;Decreased mobility;Decreased safety awareness;Decreased cognition;Decreased knowledge of use of DME       PT Treatment Interventions DME instruction;Gait training;Functional mobility training;Therapeutic exercise;Therapeutic activities;Balance training;Patient/family education    PT Goals (Current goals can be found in the Care Plan section)  Acute Rehab PT Goals Patient Stated Goal: pt wanting to go home PT Goal Formulation: With patient Time For Goal Achievement: 01/15/24 Potential to Achieve Goals: Good    Frequency Min 2X/week     Co-evaluation               AM-PAC PT 6 Clicks Mobility  Outcome Measure Help needed turning from your back to your side while in a flat bed without using bedrails?: None Help needed moving from lying on your back to sitting on the side of a flat bed without using bedrails?: None Help needed moving to and from a bed to a chair (including a wheelchair)?: A Little Help needed standing up from a chair using your arms (e.g., wheelchair or bedside chair)?: A Little Help needed to walk in hospital room?: A Little Help needed climbing 3-5 steps with a railing? : A Lot 6 Click Score: 19    End of Session Equipment Utilized During Treatment: Gait belt Activity Tolerance: Patient tolerated treatment well Patient left: with chair alarm set;with nursing/sitter in room;with call bell/phone within reach Nurse Communication: Mobility status PT Visit Diagnosis: Muscle weakness (generalized) (M62.81);Difficulty in walking, not elsewhere classified (R26.2)    Time: 9099-9074 PT Time Calculation (min) (ACUTE ONLY): 25 min   Charges:   PT Evaluation $PT Eval Low Complexity: 1 Low PT Treatments $Gait Training: 8-22 mins PT General  Charges $$ ACUTE PT VISIT: 1 Visit         Carmin JONELLE Deed, DPT 12/26/2023, 11:51 AM

## 2023-12-26 NOTE — Discharge Summary (Signed)
 Physician Discharge Summary   Patient: Karla Alexander MRN: [id number] DOB: May 21, 1932  Admit date:     12/24/2023  Discharge date: 12/26/23  Discharge Physician: Amaryllis Dare   PCP: Caretha Barter, NP   Recommendations at discharge:  Please obtain CBC and BMP and follow-up Please avoid sedating medications and slowly taper off Follow-up with primary care provider within a week  Discharge Diagnoses: Principal Problem:   Sepsis due to pneumonia [facility]) Active Problems:   Bronchitis   Acute hypoxic respiratory failure (HCC)   Acute metabolic encephalopathy   At risk for polypharmacy   Essential hypertension   AKI (acute kidney injury)   Chronic heart failure with preserved ejection fraction (HFpEF) (HCC)   Generalized weakness   History of dementia   Hypothyroidism   Diarrhea   Hypoxia   Hospital Course: Partly taken from H&P.  Karla  B Alexander is a 89 y.o. female with medical history significant for dementia, hypertension, CAD, chronic HFpEF, who presented to the ER from group home with altered mental status.  Associated with a cough that began a few days ago.  Reportedly, the patient was febrile at the facility and was having generalized weakness.  On presentation patient was febrile at 103.2, hypoxia requiring up to 3 L of oxygen with no baseline oxygen use, somnolent but arousable, UA was not suggestive of UTI, labs shows AKI with creatinine of 1.41, baseline around 1, procalcitonin negative, respiratory panel negative for COVID, RSV and influenza. CTA chest was negative for PE, did show peribronchial thickening with areas of mucoid impaction suggesting bronchitis.  Patient was started on Zithromax and ceftriaxone.  10/4: Vital stable on 3 L of oxygen.  Checking RVP to rule out other viral causes of bronchitis, preliminary blood cultures negative.  Concern of polypharmacy causing increased somnolence as patient was on gabapentin  800 mg 3 times daily-dose was recently  increased, Ativan twice daily and trazodone 100 mg at night.  Holding gabapentin  and trazodone, making Ativan as as needed.  Heart rate mostly in 50s and low 60s so holding metoprolol , restarting amlodipine  5 mg at this time and we will reintroduce antihypertensives as blood pressure allows.  10/5: Patient with significant improvement, walking around on room air without any difficulty.  Mentation at baseline.  Patient likely has a bronchitis secondary to rhinovirus as expanded RVP positive for rhinovirus.  Renal functions continue to improve.  Her altered mental status and increased somnolence was most likely secondary to multiple sedating medications.  We decreased her home gabapentin  from 800 3 times daily to 400 twice daily, make Ativan as as needed-please do not stop cold [place] rather she will need slow taper as she was taking that medicine for a long time.  I also decreased the dose of trazodone from 100 to 50 mg and make it as needed.  There was no wheezing, she was given a prescription for 3 days of Zithromax to complete the course.   Patient should avoid excessive sedation and need to have a close follow-up with her primary care provider for further assistance  Assessment and Plan: * Sepsis due to pneumonia (HCC) Bronchitis. Imaging is not consistent with pneumonia, likely a bronchitis.  Procalcitonin negative. Pneumonia ruled out.  Respiratory panel negative. Expanded RVP positive for rhinovirus - Will complete a 5-day course of Zithromax  Acute hypoxic respiratory failure (HCC) Initially required up to 3 L of oxygen, now back to room air  Acute metabolic encephalopathy Seems multifactorial with poor p.o. intake and concern of  polypharmacy causing increased somnolence. Her home gabapentin  dose was recently increased to 800 mg 3 times a day. Mentation improved by holding sedating medications, patient should be taper off from excessive sedation and try not to increase the  dose.  At risk for polypharmacy Patient was on multiple sedating medications at home, which include high-dose gabapentin , trazodone and Ativan. We decreased the dose of gabapentin , made trazodone and Ativan as needed.  AKI (acute kidney injury) Likely secondary to poor p.o. intake and continue taking home Lasix  and spironolactone.  Creatinine improved to 1.07, with baseline little less than 1 - Monitor renal function - Encourage p.o. hydration  Essential hypertension Blood pressure mildly elevated. - Continue home medications  Chronic heart failure with preserved ejection fraction (HFpEF) (HCC) Echocardiogram many years ago with normal EF and grade 2 diastolic dysfunction. Clinically appears euvolemic. Home medications were initially held due to AKI and she can resume on discharge  Generalized weakness - PT/OT evaluation-suggesting home health  History of dementia - Continue home  Aricept  -Delirium precautions  Hypothyroidism Check TSH -Continue with home Synthroid   Diarrhea Also concern of dark-colored stool.  Hemoglobin seems stable. - Check FOBT -Ordered C. difficile testing   Consultants: None Procedures performed: None Disposition: Home health Diet recommendation:  Discharge Diet Orders (From admission, onward)     Start     Ordered   12/26/23 0000  Diet - low sodium heart healthy        12/26/23 1216           Cardiac diet DISCHARGE MEDICATION: Allergies as of 12/26/2023   No Known Allergies      Medication List     STOP taking these medications    pantoprazole  40 MG tablet Commonly known as: PROTONIX    polyethylene glycol 17 g packet Commonly known as: MIRALAX  / GLYCOLAX    sodium chloride  1 g tablet       TAKE these medications    acetaminophen  650 MG CR tablet Commonly known as: TYLENOL  Take 650 mg by mouth every 8 (eight) hours as needed for pain.   amLODipine  5 MG tablet Commonly known as: NORVASC  Take 5 mg by mouth daily.    azithromycin 250 MG tablet Commonly known as: ZITHROMAX Take 1 tablet (250 mg total) by mouth daily for 3 days.   carboxymethylcellulose 0.5 % Soln Commonly known as: REFRESH PLUS Place 1 drop into both eyes 3 (three) times daily.   diclofenac Sodium 1 % Gel Commonly known as: VOLTAREN Apply 2 g topically 4 (four) times daily as needed (pain). Apply topically on bilateral hands and right elbow   docusate sodium  100 MG capsule Commonly known as: COLACE Take 1 capsule (100 mg total) by mouth 2 (two) times daily.   donepezil  10 MG tablet Commonly known as: ARICEPT  Take 10 mg by mouth at bedtime.   ferrous sulfate 325 (65 FE) MG EC tablet 1 tablet (325 mg total) 3 (three) times a week.   fluticasone  50 MCG/ACT nasal spray Commonly known as: FLONASE  Place 1 spray into both nostrils 2 (two) times daily.   furosemide  20 MG tablet Commonly known as: LASIX  Take 1 tablet (20 mg total) by mouth daily as needed for fluid or edema. What changed:  how much to take when to take this   gabapentin  400 MG tablet Commonly known as: NEURONTIN  Take 1 tablet (400 mg total) by mouth 2 (two) times daily. What changed:  medication strength how much to take when to take this  Gemtesa 75 MG Tabs Generic drug: Vibegron Take 75 mg by mouth daily.   guaiFENesin-dextromethorphan 100-10 MG/5ML syrup Commonly known as: ROBITUSSIN DM Take 5 mLs by mouth every 4 (four) hours as needed for cough.   isosorbide  mononitrate 10 MG tablet Commonly known as: ISMO  Take 3 tablets (30 mg total) by mouth daily. What changed:  how much to take when to take this   levothyroxine  112 MCG tablet Commonly known as: SYNTHROID  Take 112 mcg by mouth daily.   lidocaine  5 % Commonly known as: LIDODERM  Place 1 patch onto the skin daily. Remove & Discard patch within 12 hours   Linzess 145 MCG Caps capsule Generic drug: linaclotide Take 145 mcg by mouth daily.   loratadine  10 MG tablet Commonly known  as: CLARITIN  Take 10 mg by mouth daily.   LORazepam 0.5 MG tablet Commonly known as: ATIVAN Take 1 tablet (0.5 mg total) by mouth 2 (two) times daily as needed for anxiety. What changed:  when to take this reasons to take this   metoprolol  tartrate 25 MG tablet Commonly known as: LOPRESSOR  Take 75 mg by mouth 2 (two) times daily.   nitroGLYCERIN 0.4 MG SL tablet Commonly known as: NITROSTAT Place 0.4 mg under the tongue every 5 (five) minutes as needed for chest pain. x3   omeprazole 20 MG capsule Commonly known as: PRILOSEC Take 20 mg by mouth daily.   rOPINIRole  1 MG tablet Commonly known as: REQUIP  Take 1 mg by mouth at bedtime.   sertraline  25 MG tablet Commonly known as: ZOLOFT  Take 75 mg by mouth daily.   spironolactone 25 MG tablet Commonly known as: ALDACTONE Take 25 mg by mouth daily.   sucralfate  1 g tablet Commonly known as: CARAFATE  Take 1 tablet (1 g total) by mouth 4 (four) times daily -  with meals and at bedtime.   traZODone 50 MG tablet Commonly known as: DESYREL Take 1 tablet (50 mg total) by mouth at bedtime as needed for sleep. What changed:  medication strength how much to take when to take this reasons to take this        Follow-up Information     Caretha Barter, NP. Schedule an appointment as soon as possible for a visit in 1 week(s).   Specialty: Nurse Practitioner Contact information: [address] [place] [address] [phone]                Discharge Exam: Filed Weights   12/24/23 2249 12/25/23 0500 12/26/23 0450  Weight: 64.4 kg 64.4 kg 64.5 kg   General.  Frail elderly lady, in no acute distress. Pulmonary.  Lungs clear bilaterally, normal respiratory effort. CV.  Regular rate and rhythm, no JVD, rub or murmur. Abdomen.  Soft, nontender, nondistended, BS positive. CNS.  Alert and oriented .  No focal neurologic deficit. Extremities.  No edema, no cyanosis, pulses intact and symmetrical.  Condition  at discharge: stable  The results of significant diagnostics from this hospitalization (including imaging, microbiology, ancillary and laboratory) are listed below for reference.   Imaging Studies: CT Angio Chest PE W and/or Wo Contrast Result Date: 12/24/2023 CLINICAL DATA:  Provided history: SOB, altered, hypoxic, cough. suspect pna, eval infiltrate/PE EXAM: CT ANGIOGRAPHY CHEST WITH CONTRAST TECHNIQUE: Multidetector CT imaging of the chest was performed using the standard protocol during bolus administration of intravenous contrast. Multiplanar CT image reconstructions and MIPs were obtained to evaluate the vascular anatomy. RADIATION DOSE REDUCTION: This exam was performed according to the departmental dose-optimization  program which includes automated exposure control, adjustment of the mA and/or kV according to patient size and/or use of iterative reconstruction technique. CONTRAST:  75mL OMNIPAQUE  IOHEXOL  350 MG/ML SOLN COMPARISON:  Radiograph earlier today.  Chest CT 03/09/2023 FINDINGS: Cardiovascular: There are no filling defects within the pulmonary arteries to suggest pulmonary embolus. The heart is enlarged. There are coronary artery calcifications. Aortic atherosclerosis. No aortic aneurysm. No pericardial effusion. Mediastinum/Nodes: Small hiatal hernia. Patulous esophagus. Small mediastinal and hilar lymph nodes are not enlarged by size criteria. No visible thyroid  nodule. Lungs/Pleura: Prominent bronchial thickening with areas of mucoid impaction. Heterogeneous pulmonary parenchyma but no confluent airspace disease or evidence of pneumonia. Narrowing and bowing of the trachea and central airways. 3 mm right middle lobe nodule series 5, image 79, unchanged from prior. The previous 2 mm left lower lobe nodule is obscured. Upper Abdomen: No acute findings. Mild left adrenal thickening is unchanged. Musculoskeletal: Left shoulder arthroplasty. Chronic T6 compression fracture. Diffuse degenerative  change throughout the spine, stable. There are no acute or suspicious osseous abnormalities. Review of the MIP images confirms the above findings. IMPRESSION: 1. No pulmonary embolus. 2. Prominent bronchial thickening with areas of mucoid impaction, suggesting bronchitis. Heterogeneous pulmonary parenchyma may be due to small airways disease. 3. Narrowing and bowing of the trachea and central airways, can be seen with tracheobronchomalacia. 4. Cardiomegaly with coronary artery calcifications. 5. Small hiatal hernia. Aortic Atherosclerosis (ICD10-I70.0) . Electronically Signed   By: Andrea Gasman M.D.   On: 12/24/2023 21:20   DG Chest Portable 1 View Result Date: 12/24/2023 CLINICAL DATA:  Altered mental status and cough. EXAM: PORTABLE CHEST 1 VIEW COMPARISON:  July 18, 2023 FINDINGS: The cardiac silhouette is mildly enlarged and unchanged in size. There is marked severity calcification of the aortic arch. Chronic appearing diffusely increased interstitial lung markings are seen. This is very mildly increased in severity when compared to the prior study. Mild atelectatic changes are suspected within the bilateral lung bases. No pleural effusion or pneumothorax is identified. There is evidence of prior left shoulder arthroplasty. No acute osseous abnormality is identified. IMPRESSION: 1. Chronic appearing increased interstitial lung markings. A mild superimposed component of interstitial edema cannot be excluded. 2. Mild bibasilar atelectasis. Electronically Signed   By: Suzen Dials M.D.   On: 12/24/2023 20:08    Microbiology: Results for orders placed or performed during the hospital encounter of 12/24/23  Blood culture (routine x 2)     Status: None (Preliminary result)   Collection Time: 12/24/23  7:20 PM   Specimen: BLOOD  Result Value Ref Range Status   Specimen Description BLOOD RIGHT ANTECUBITAL  Final   Special Requests   Final    BOTTLES DRAWN AEROBIC AND ANAEROBIC Blood Culture  results may not be optimal due to an inadequate volume of blood received in culture bottles   Culture   Final    NO GROWTH 2 DAYS Performed at [facility], [address] [place], [place] [address]    Report Status PENDING  Incomplete  Blood culture (routine x 2)     Status: None (Preliminary result)   Collection Time: 12/24/23  7:20 PM   Specimen: BLOOD  Result Value Ref Range Status   Specimen Description BLOOD BLOOD LEFT HAND  Final   Special Requests   Final    BOTTLES DRAWN AEROBIC AND ANAEROBIC Blood Culture results may not be optimal due to an inadequate volume of blood received in culture bottles   Culture  Final    NO GROWTH 2 DAYS Performed at [facility], [address] [place], [place] [address]    Report Status PENDING  Incomplete  Resp panel by RT-PCR (RSV, Flu A&B, Covid) Anterior Nasal Swab     Status: None   Collection Time: 12/24/23  7:43 PM   Specimen: Anterior Nasal Swab  Result Value Ref Range Status   SARS Coronavirus 2 by RT PCR NEGATIVE NEGATIVE Final    Comment: (NOTE) SARS-CoV-2 target nucleic acids are NOT DETECTED.  The SARS-CoV-2 RNA is generally detectable in upper respiratory specimens during the acute phase of infection. The lowest concentration of SARS-CoV-2 viral copies this assay can detect is 138 copies/mL. A negative result does not preclude SARS-Cov-2 infection and should not be used as the sole basis for treatment or other patient management decisions. A negative result may occur with  improper specimen collection/handling, submission of specimen other than nasopharyngeal swab, presence of viral mutation(s) within the areas targeted by this assay, and inadequate number of viral copies(<138 copies/mL). A negative result must be combined with clinical observations, patient history, and epidemiological information. The expected result is Negative.  Fact Sheet for Patients:   BloggerCourse.com  Fact Sheet for Healthcare Providers:  SeriousBroker.it  This test is no t yet approved or cleared by the [place]  FDA and  has been authorized for detection and/or diagnosis of SARS-CoV-2 by FDA under an Emergency Use Authorization (EUA). This EUA will remain  in effect (meaning this test can be used) for the duration of the COVID-19 declaration under Section 564(b)(1) of the Act, 21 U.S.C.section 360bbb-3(b)(1), unless the authorization is terminated  or revoked sooner.       Influenza A by PCR NEGATIVE NEGATIVE Final   Influenza B by PCR NEGATIVE NEGATIVE Final    Comment: (NOTE) The Xpert Xpress SARS-CoV-2/FLU/RSV plus assay is intended as an aid in the diagnosis of influenza from Nasopharyngeal swab specimens and should not be used as a sole basis for treatment. Nasal washings and aspirates are unacceptable for Xpert Xpress SARS-CoV-2/FLU/RSV testing.  Fact Sheet for Patients: BloggerCourse.com  Fact Sheet for Healthcare Providers: SeriousBroker.it  This test is not yet approved or cleared by the [place]  FDA and has been authorized for detection and/or diagnosis of SARS-CoV-2 by FDA under an Emergency Use Authorization (EUA). This EUA will remain in effect (meaning this test can be used) for the duration of the COVID-19 declaration under Section 564(b)(1) of the Act, 21 U.S.C. section 360bbb-3(b)(1), unless the authorization is terminated or revoked.     Resp Syncytial Virus by PCR NEGATIVE NEGATIVE Final    Comment: (NOTE) Fact Sheet for Patients: BloggerCourse.com  Fact Sheet for Healthcare Providers: SeriousBroker.it  This test is not yet approved or cleared by the [place]  FDA and has been authorized for detection and/or diagnosis of SARS-CoV-2 by FDA under an Emergency Use  Authorization (EUA). This EUA will remain in effect (meaning this test can be used) for the duration of the COVID-19 declaration under Section 564(b)(1) of the Act, 21 U.S.C. section 360bbb-3(b)(1), unless the authorization is terminated or revoked.  Performed at [facility], [address] [place], [place] [address]   Respiratory (~20 pathogens) panel by PCR     Status: Abnormal   Collection Time: 12/25/23  9:30 AM   Specimen: Nasopharyngeal Swab; Respiratory  Result Value Ref Range Status   Adenovirus NOT DETECTED NOT DETECTED Final   Coronavirus 229E NOT DETECTED NOT DETECTED Final  Comment: (NOTE) The Coronavirus on the Respiratory Panel, DOES NOT test for the novel  Coronavirus (2019 nCoV)    Coronavirus HKU1 NOT DETECTED NOT DETECTED Final   Coronavirus NL63 NOT DETECTED NOT DETECTED Final   Coronavirus OC43 NOT DETECTED NOT DETECTED Final   Metapneumovirus NOT DETECTED NOT DETECTED Final   Rhinovirus / Enterovirus DETECTED (A) NOT DETECTED Final   Influenza A NOT DETECTED NOT DETECTED Final   Influenza B NOT DETECTED NOT DETECTED Final   Parainfluenza Virus 1 NOT DETECTED NOT DETECTED Final   Parainfluenza Virus 2 NOT DETECTED NOT DETECTED Final   Parainfluenza Virus 3 NOT DETECTED NOT DETECTED Final   Parainfluenza Virus 4 NOT DETECTED NOT DETECTED Final   Respiratory Syncytial Virus NOT DETECTED NOT DETECTED Final   Bordetella pertussis NOT DETECTED NOT DETECTED Final   Bordetella Parapertussis NOT DETECTED NOT DETECTED Final   Chlamydophila pneumoniae NOT DETECTED NOT DETECTED Final   Mycoplasma pneumoniae NOT DETECTED NOT DETECTED Final    Comment: Performed at [facility], [address] [place], [place] [address]  C Difficile Quick Screen w PCR reflex     Status: None   Collection Time: 12/25/23  2:17 PM   Specimen: Stool  Result Value Ref Range Status   C Diff antigen NEGATIVE NEGATIVE Final   C Diff toxin NEGATIVE NEGATIVE Final   C Diff  interpretation No C. difficile detected.  Final    Comment: Performed at [facility], [address] [place], [place] [address]    Labs: CBC: Recent Labs  Lab 12/24/23 1920 12/25/23 0608  WBC 9.7 7.8  HGB 16.8* 16.2*  HCT 51.3* 49.3*  MCV 97.0 96.9  PLT 163 158   Basic Metabolic Panel: Recent Labs  Lab 12/24/23 1920 12/25/23 0608 12/26/23 1007  NA 135 135 135  K 4.5 4.0 3.8  CL 98 100 96*  CO2 28 27 25   GLUCOSE 109* 83 141*  BUN 16 17 14   CREATININE 1.41* 1.09* 1.07*  CALCIUM  9.4 8.7* 9.7  MG  --  1.8  --   PHOS  --  3.0  --    Liver Function Tests: Recent Labs  Lab 12/24/23 1920  AST 23  ALT 16  ALKPHOS 76  BILITOT 0.9  PROT 7.4  ALBUMIN  4.4   CBG: Recent Labs  Lab 12/25/23 0503  GLUCAP 102*    Discharge time spent: greater than 30 minutes.  This record has been created using Conservation officer, historic buildings. Errors have been sought and corrected,but may not always be located. Such creation errors do not reflect on the standard of care.   Signed: Amaryllis Dare, MD [facility] 12/26/2023

## 2023-12-29 LAB — CULTURE, BLOOD (ROUTINE X 2)
Culture: NO GROWTH
Culture: NO GROWTH

## 2024-02-07 ENCOUNTER — Ambulatory Visit
Admission: RE | Admit: 2024-02-07 | Discharge: 2024-02-07 | Disposition: A | Discharge status: Home / Self Care | Source: Ambulatory Visit | Attending: Urology | Admitting: Urology

## 2024-02-07 DIAGNOSIS — N2889 Other specified disorders of kidney and ureter: Secondary | ICD-10-CM | POA: Diagnosis present

## 2024-03-01 ENCOUNTER — Ambulatory Visit: Payer: Self-pay | Admitting: Urology

## 2024-03-01 NOTE — Progress Notes (Signed)
° °  03/02/2024 2:23 PM   Karla  B Alexander [birth year] [id number]  Reason for visit: Follow up Right renal mass   HPI: 89 y.o. female, initial follow up with me today, previously seen by Dr. Francisca in Dec 2024  First time meeting today CT A/P w/ (Nov 2025) -7.7 cm right lower pole renal mass (similar in size and character to prior scan, approximately 6-7 mm growth in last year) Accompanied by a caregiver from home today Patient unable to provide any history No medical report of abdominal symptoms, flank pain or hematuria  Prior HPI: Briefly, hx of a ~7cm Right renal mass, followed on AS since 2019   Comorbidities including CAD, COPD, Alzheimer's dementia  Very challenging historian secondary to her severe dementia     Physical Exam: BP 136/65   Pulse (!) 55   Ht 4' 8 (1.422 m)   Wt 147 lb 9.6 oz (67 kg)   BMI 33.09 kg/m    Constitutional:  Alert and oriented, No acute distress.  Laboratory Data:  Latest Reference Range & Units 07/15/23 02:47 07/18/23 21:18 12/24/23 19:20 12/25/23 06:08 12/26/23 10:07  Creatinine 0.44 - 1.00 mg/dL 8.81 (H) 9.14 8.58 (H) 1.09 (H) 1.07 (H)  (H): Data is abnormally high  Pertinent Imaging: I have personally viewed and interpreted the CT A/P w/ (Nov 2025) -7.7 cm right lower pole renal mass (similar in size and character to prior scan, approximately 6-7 mm growth in last year).  Large expansile exophytic mass of right lower pole.  Lateral left kidney morphologically normal in appearance  IMPRESSION: 1. Mild enlargement of a lower pole right renal cell carcinoma. 2. No evidence of abdominal metastatic disease. 3. Right nephrolithiasis. 4. Incidental findings, including aortic atherosclerosis (ICD10 I70.0), coronary artery atherosclerosis, and small hiatal hernia.    Assessment & Plan:    Right renal mass Assessment & Plan: ~7.7 cm Right lower pole renal mass (on AS since 2019) Significant medical comorbidity + advanced dementia   -  Considering her age and competing comorbidity, I would advocate for transition to a watchful waiting approach only.  We do not necessarily need to continue routine imaging surveillance.  Would not recommend aggressive treatment.  If she develops local symptoms, can certainly revisit interval imaging and palliative options.  - I am happy to see her back in 1 year for checkup, symptomatic evaluation        Penne JONELLE Skye, MD  [facility] [address], [address] [place], [place] [address] [phone]

## 2024-03-01 NOTE — Assessment & Plan Note (Addendum)
~  7.7 cm Right lower pole renal mass (on AS since 2019) Significant medical comorbidity + advanced dementia   - Considering her age and competing comorbidity, I would advocate for transition to a watchful waiting approach only.  We do not necessarily need to continue routine imaging surveillance.  Would not recommend aggressive treatment.  If she develops local symptoms, can certainly revisit interval imaging and palliative options.  - I am happy to see her back in 1 year for checkup, symptomatic evaluation

## 2024-03-02 ENCOUNTER — Encounter: Payer: Self-pay | Admitting: Urology

## 2024-03-02 ENCOUNTER — Ambulatory Visit: Admitting: Urology

## 2024-03-02 VITALS — BP 136/65 | HR 55 | Ht <= 58 in | Wt 147.6 lb

## 2024-03-02 DIAGNOSIS — N2889 Other specified disorders of kidney and ureter: Secondary | ICD-10-CM

## 2025-03-02 ENCOUNTER — Ambulatory Visit: Admitting: Urology
